# Patient Record
Sex: Female | Born: 1960 | Race: White | Hispanic: No | Smoking: Never smoker
Health system: Southern US, Community
[De-identification: ages and names within clinical notes are randomized; demographics above are authoritative.]

## PROBLEM LIST (undated history)

## (undated) DIAGNOSIS — K219 Gastro-esophageal reflux disease without esophagitis: Secondary | ICD-10-CM

## (undated) DIAGNOSIS — R232 Flushing: Secondary | ICD-10-CM

## (undated) DIAGNOSIS — R112 Nausea with vomiting, unspecified: Secondary | ICD-10-CM

## (undated) DIAGNOSIS — F32A Depression, unspecified: Secondary | ICD-10-CM

## (undated) DIAGNOSIS — F329 Major depressive disorder, single episode, unspecified: Secondary | ICD-10-CM

## (undated) DIAGNOSIS — Z9889 Other specified postprocedural states: Secondary | ICD-10-CM

## (undated) DIAGNOSIS — I471 Supraventricular tachycardia, unspecified: Secondary | ICD-10-CM

## (undated) DIAGNOSIS — C801 Malignant (primary) neoplasm, unspecified: Secondary | ICD-10-CM

## (undated) DIAGNOSIS — R7303 Prediabetes: Secondary | ICD-10-CM

## (undated) DIAGNOSIS — J309 Allergic rhinitis, unspecified: Secondary | ICD-10-CM

## (undated) DIAGNOSIS — C50919 Malignant neoplasm of unspecified site of unspecified female breast: Secondary | ICD-10-CM

## (undated) DIAGNOSIS — G43909 Migraine, unspecified, not intractable, without status migrainosus: Secondary | ICD-10-CM

## (undated) DIAGNOSIS — K589 Irritable bowel syndrome without diarrhea: Secondary | ICD-10-CM

## (undated) DIAGNOSIS — E7800 Pure hypercholesterolemia, unspecified: Secondary | ICD-10-CM

## (undated) DIAGNOSIS — G4700 Insomnia, unspecified: Secondary | ICD-10-CM

## (undated) DIAGNOSIS — F419 Anxiety disorder, unspecified: Secondary | ICD-10-CM

## (undated) DIAGNOSIS — M8580 Other specified disorders of bone density and structure, unspecified site: Secondary | ICD-10-CM

## (undated) DIAGNOSIS — K449 Diaphragmatic hernia without obstruction or gangrene: Secondary | ICD-10-CM

## (undated) DIAGNOSIS — J45909 Unspecified asthma, uncomplicated: Secondary | ICD-10-CM

## (undated) HISTORY — DX: Prediabetes: R73.03

## (undated) HISTORY — PX: UPPER GASTROINTESTINAL ENDOSCOPY: SHX188

## (undated) HISTORY — DX: Other specified disorders of bone density and structure, unspecified site: M85.80

## (undated) HISTORY — DX: Depression, unspecified: F32.A

## (undated) HISTORY — DX: Unspecified asthma, uncomplicated: J45.909

## (undated) HISTORY — DX: Malignant neoplasm of unspecified site of unspecified female breast: C50.919

## (undated) HISTORY — DX: Allergic rhinitis, unspecified: J30.9

## (undated) HISTORY — DX: Anxiety disorder, unspecified: F41.9

## (undated) HISTORY — DX: Migraine, unspecified, not intractable, without status migrainosus: G43.909

## (undated) HISTORY — DX: Insomnia, unspecified: G47.00

## (undated) HISTORY — PX: VAGINAL HYSTERECTOMY: SUR661

## (undated) HISTORY — PX: COLOSTOMY: SHX63

## (undated) HISTORY — DX: Gastro-esophageal reflux disease without esophagitis: K21.9

## (undated) HISTORY — DX: Diaphragmatic hernia without obstruction or gangrene: K44.9

## (undated) HISTORY — DX: Major depressive disorder, single episode, unspecified: F32.9

## (undated) HISTORY — DX: Flushing: R23.2

## (undated) HISTORY — DX: Irritable bowel syndrome, unspecified: K58.9

## (undated) NOTE — *Deleted (*Deleted)
[facility]  [phone] [phone]     ID: Kathy Barker DOB: August 18, 1960  MR#: [id number]  JYN#:[id number]  Patient Care Team: Lupita Raider, MD as PCP - General (Family Medicine) Donnelly Angelica, RN as Oncology Nurse Navigator Pershing Proud, RN as Oncology Nurse Navigator Griselda Miner, MD as Consulting Physician (General Surgery) Alley Neils, Valentino Hue, MD as Consulting Physician (Oncology) Lonie Peak, MD as Attending Physician (Radiation Oncology) Janalyn Harder, MD as Consulting Physician (Dermatology) Lowella Dell, MD OTHER MD:  CHIEF COMPLAINT: Left-sided breast cancer  CURRENT TREATMENT: awaiting definitive surgery   INTERVAL HISTORY: Kathy Barker returns today for follow up of her left-sided breast cancer.    REVIEW OF SYSTEMS: [place]    HISTORY OF CURRENT ILLNESS: From the original intake note:  Kathy Barker herself noted a palpable lower-inner left breast lump and immediately brought it to medical attention.. She underwent bilateral diagnostic mammography with tomography and left breast ultrasonography at [facility] on 05/30/2019 showing: breast density category B; 1.7 cm lobulated mass in left breast at 8 o'clock; no significant left axillary abnormalities.  Accordingly on 06/01/2019 she proceeded to biopsy of the left breast area in question. The pathology from this procedure ([id number]) showed: invasive ductal carcinoma, grade 3. Prognostic indicators significant for: estrogen receptor, 70% positive with weak staining intensity and progesterone receptor, 0% negative. Proliferation marker Ki67 at 80%. HER2 negative by immunohistochemistry (1+).  The patient's subsequent history is as detailed below.   PAST MEDICAL HISTORY: Past Medical History:  Diagnosis Date  . Allergic rhinitis   . Anxiety    "only during my divorce" (11/18/2012)  . Cancer (HCC)   . Depression    "only during my divorce" (11/18/2012)  . GERD  (gastroesophageal reflux disease)   . High cholesterol   . Hot flashes   . IBS (irritable bowel syndrome)   . Insomnia   . Migraines    "lately more often; before SVT I'd get them ~ q 6 months" (11/18/2012)  . PONV (postoperative nausea and vomiting)   . 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 CESAREAN SECTION  1992  . COMBINED HYSTERECTOMY VAGINAL / OOPHORECTOMY / A&P REPAIR  1999  . IR IMAGING GUIDED PORT INSERTION  06/30/2019  . SUPRAVENTRICULAR TACHYCARDIA ABLATION  11-18-2012   slow pathway modification of AVNRT by Dr Johney Frame  . SUPRAVENTRICULAR TACHYCARDIA ABLATION N/A 11/18/2012   Procedure: SUPRAVENTRICULAR TACHYCARDIA ABLATION;  Surgeon: Gardiner Rhyme, MD;  Location: [facility] CATH [facility];  Service: Cardiovascular;  Laterality: N/A;  . VAGINAL HYSTERECTOMY  ~ 2000    FAMILY HISTORY: Family History  Problem Relation Age of Onset  . Heart attack Father   . Alcohol abuse Father   . Heart attack Paternal Grandfather   . Heart Problems Other        both sides of family  . Breast cancer Mother    Her father died at age 46 from alcohol abuse. Her mother is living at age 70 (as of 05/2019) and has a history of DCIS at age 43 and invasive lobular breast cancer at age 43. Kathy Barker has one brother. She reports cancer of an unknown type in her maternal grandmother and prostate cancer in a maternal uncle.   GYNECOLOGIC HISTORY:  No LMP recorded. Patient has had a hysterectomy. Menarche: 21 years old Age at first live  birth: 82 years old GX P 2 LMP 2000 Contraceptive: never used HRT never used  Hysterectomy? yes BSO? 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  She will be on temporary disability during her chemotherapy.  She is divorced. She lives at home with son  Kathy Barker, age 42, who works in Aeronautical engineer in [place]. Son Kathy Barker, age 52, works in Engineer, petroleum here in [place]. She is not a Actor.     ADVANCED DIRECTIVES: Not in place. She intends to name both of her sons as her HCPOA.   HEALTH MAINTENANCE: Social History   Tobacco Use  . Smoking status: Never Smoker  . Smokeless tobacco: Never Used  Vaping Use  . Vaping Use: Never used  Substance Use Topics  . Alcohol use: Yes    Comment: 11/18/2012 "shot of brandy couple times/month"  . Drug use: No     Colonoscopy: yes, date unsure  PAP: date unsure  Bone density: never done   Allergies  Allergen Reactions  . Fentanyl Nausea And Vomiting    Per patient " extreme nausea and vomiting "   . Other     Problems with stitches  . Versed [Midazolam] Nausea And Vomiting    Per patient " extreme N/V"   . Latex Rash    Current Outpatient Medications  Medication Sig Dispense Refill  . acyclovir (ZOVIRAX) 400 MG tablet Take 2 tablets tid for 10 days then decrease to 1 tablet twice a day 120 tablet 3  . ciprofloxacin (CIPRO) 500 MG tablet Take 1 tablet (500 mg total) by mouth 2 (two) times daily. 10 tablet 0  . HYDROMET 5-1.5 MG/5ML syrup SMARTSIG:2 Milliliter(s) By Mouth 10 Times Daily PRN    . LIVALO 4 MG TABS Take 1 tablet by mouth daily.    [name] loratadine (CLARITIN) 10 MG tablet Take 1 tablet (10 mg total) by mouth daily. 60 tablet 3  . magic mouthwash w/lidocaine SOLN Take 5 mLs by mouth 4 (four) times daily as needed for mouth pain. 240 mL 1  . omeprazole (PRILOSEC) 40 MG capsule Take one capsule daily for 5 days starting on chemo day, then daily as needed 60 capsule 3  . sertraline (ZOLOFT) 100 MG tablet Take 100 mg by mouth daily.    . SYMBICORT 160-4.5 MCG/ACT inhaler Inhale 2 puffs into the lungs 2 (two) times daily.    . temazepam (RESTORIL) 15 MG capsule Take 15 mg by mouth at bedtime.     No current facility-administered medications for this visit.     OBJECTIVE: White woman in no acute distress There were no vitals filed for this visit.   There is no height or weight on file to calculate BMI.   Wt Readings from Last 3 Encounters:  10/26/19 140 lb 4.8 oz (63.6 kg)  10/19/19 139 lb 3.2 oz (63.1 kg)  10/04/19 136 lb (61.7 kg)      ECOG FS:1 - Symptomatic but completely ambulatory  Sclerae unicteric, EOMs intact Wearing a mask No cervical or supraclavicular adenopathy Lungs no rales or rhonchi Heart regular rate and rhythm Abd soft, nontender, positive bowel sounds MSK no focal spinal tenderness, no upper extremity lymphedema Neuro: nonfocal, well oriented, appropriate affect Breasts:    {Sclerae unicteric, EOMs intact Wearing a mask No cervical or supraclavicular adenopathy Lungs no rales or rhonchi Heart regular rate and rhythm Abd soft, nontender, positive bowel sounds MSK no focal spinal  tenderness, no upper extremity lymphedema Neuro: nonfocal, well oriented, appropriate affect Breasts: The right breast is unremarkable.  I do not palpate a mass in the left breast and there are no skin or nipple changes of concern.  Both axillae are benign.}   LAB RESULTS:  CMP     Component Value Date/Time   NA 141 10/26/2019 0822   K 3.9 10/26/2019 0822   CL 108 10/26/2019 0822   CO2 26 10/26/2019 0822   GLUCOSE 119 (H) 10/26/2019 0822   BUN 9 10/26/2019 0822   CREATININE 0.74 10/26/2019 0822   CALCIUM 9.3 10/26/2019 0822   PROT 6.5 10/26/2019 0822   ALBUMIN 3.8 10/26/2019 0822   AST 23 10/26/2019 0822   ALT 37 10/26/2019 0822   ALKPHOS 123 10/26/2019 0822   BILITOT 0.3 10/26/2019 0822   GFRNONAA >60 10/26/2019 0822   GFRAA >60 10/04/2019 1241    No results found for: TOTALPROTELP, ALBUMINELP, A1GS, A2GS, BETS, BETA2SER, GAMS, MSPIKE, SPEI  Lab Results  Component Value Date   WBC 4.9 10/26/2019   NEUTROABS 3.3 10/26/2019   HGB 11.8 (L) 10/26/2019   HCT 35.3 (L) 10/26/2019   MCV 98.3 10/26/2019   PLT 165 10/26/2019     No results found for: LABCA2  No components found for: XLKGMW102  No results for input(s): INR in the last 168 hours.  No results found for: LABCA2  No results found for: VOZ366  No results found for: YQI347  No results found for: QQV956  No results found for: CA2729  No components found for: HGQUANT  No results found for: CEA1 / No results found for: CEA1   No results found for: AFPTUMOR  No results found for: CHROMOGRNA  No results found for: KPAFRELGTCHN, LAMBDASER, KAPLAMBRATIO (kappa/lambda light chains)  No results found for: HGBA, HGBA2QUANT, HGBFQUANT, HGBSQUAN (Hemoglobinopathy evaluation)   No results found for: LDH  No results found for: IRON, TIBC, IRONPCTSAT (Iron and TIBC)  No results found for: FERRITIN  Urinalysis No results found for: COLORURINE, APPEARANCEUR, LABSPEC, PHURINE, GLUCOSEU, HGBUR, BILIRUBINUR, KETONESUR, PROTEINUR, UROBILINOGEN, NITRITE, LEUKOCYTESUR   STUDIES: MR BREAST BILATERAL W WO CONTRAST INC CAD  Result Date: 10/24/2019 CLINICAL DATA:  35 year old female with LEFT breast cancer diagnosed in June 2021, evaluate response to neoadjuvant treatment. Patient also had MR guided biopsies demonstrating complex sclerosing lesion within the UPPER central LEFT breast and benign changes within the LOWER central LEFT breast. LABS:  None performed today EXAM: BILATERAL BREAST MRI WITH AND WITHOUT CONTRAST TECHNIQUE: Multiplanar, multisequence MR images of both breasts were obtained prior to and following the intravenous administration of 10 ml of Gadavist Three-dimensional MR images were rendered by post-processing of the original MR data on an independent workstation. The three-dimensional MR images were interpreted, and findings are reported in the following complete MRI report for this study. Three dimensional images were evaluated at the independent interpreting workstation using the DynaCAD thin client. COMPARISON:  06/29/2019 FINDINGS:  Breast composition: b. Scattered fibroglandular tissue. Background parenchymal enhancement: Mild Right breast: No mass or abnormal enhancement. Scattered foci are again noted. Left breast: The known malignancy within the LOWER INNER LEFT breast is no longer visualized. Biopsy clip artifact in this area is again identified. Scattered foci and clumped non masslike enhancement are not significantly changed. Biopsy clip artifacts within the central LEFT breast noted. Lymph nodes: No abnormal appearing lymph nodes. Ancillary findings: Port-A-Cath within the UPPER MEDIAL RIGHT chest noted. IMPRESSION: 1. Treatment response with known LOWER INNER LEFT breast  malignancy no longer visualized. 2. No suspicious findings within either breast. No abnormal appearing lymph nodes. RECOMMENDATION: Treatment plan BI-RADS CATEGORY  6: Known biopsy-proven malignancy. Electronically Signed   By: Harmon Pier M.D.   On: 10/24/2019 15:38     ELIGIBLE FOR AVAILABLE RESEARCH PROTOCOL: no  ASSESSMENT: 13 y.o. [place] woman status post left breast lower inner quadrant biopsy 06/01/2019 for a clinical T1c N0, stage IB invasive ductal carcinoma, grade 3, weakly estrogen receptor positive but functionally triple negative, with an MIB-1 of 80%.  (1) Oncotype score of 51 predicts a risk of recurrence outside the breast in the next 9 years of greater than 39% if the patient's only systemic therapy is antiestrogens for 5 years.  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  Likely this is still not affecting her daily activities.  We reviewed the MRI results which are very encouraging.  I am hopeful she will have a complete pathologic response at the time of definitive surgery.  I have alerted her surgeon to let him know she is now ready.  Just in case there is some residual disease, in which case we would use pembrolizumab, we are going to keep the port for now  Karlina will see me in about a month by which time we should have the results of her definitive surgery  She knows to call for any other issue that may develop before then  Total encounter time 25 minutes.Raymond Gurney C. Alsha Meland, MD 11/02/19 10:48 AM Medical Oncology and Hematology [facility] [address] [place], [place] [address] Tel. [phone]    Fax. [phone]   I, Mickie Bail, am acting as scribe for Dr. Valentino Hue. Christian Treadway.  I, Ruthann Cancer MD, have reviewed the above documentation for accuracy and completeness, and I agree with the above.   *Total Encounter Time as defined by the Centers for Medicare and Medicaid Services includes, in addition to the face-to-face time of a patient visit (documented in the note above) non-face-to-face time: obtaining and reviewing outside history, ordering and reviewing medications, tests or procedures, care coordination (communications with other health care professionals or caregivers) and documentation in the medical record.

---

## 1997-01-13 HISTORY — PX: COMBINED HYSTERECTOMY VAGINAL / OOPHORECTOMY / A&P REPAIR: SUR294

## 1997-05-05 ENCOUNTER — Ambulatory Visit (HOSPITAL_BASED_OUTPATIENT_CLINIC_OR_DEPARTMENT_OTHER): Admission: RE | Admit: 1997-05-05 | Discharge: 1997-05-05 | Payer: Self-pay | Admitting: Urology

## 1999-04-29 ENCOUNTER — Other Ambulatory Visit: Admission: RE | Admit: 1999-04-29 | Discharge: 1999-04-29 | Payer: Self-pay | Admitting: Obstetrics and Gynecology

## 2002-10-11 ENCOUNTER — Encounter: Payer: Self-pay | Admitting: Family Medicine

## 2002-10-11 ENCOUNTER — Encounter: Admission: RE | Admit: 2002-10-11 | Discharge: 2002-10-11 | Payer: Self-pay | Admitting: Family Medicine

## 2012-10-23 ENCOUNTER — Telehealth: Payer: Self-pay | Admitting: Interventional Cardiology

## 2012-10-23 NOTE — Telephone Encounter (Signed)
Please have patient come in to see Kathy Barker next week to make sure she is tolerating the beta blocker ok

## 2012-10-23 NOTE — Telephone Encounter (Signed)
I spoke to Ms. Hyman Bower.  She has severe palpitations with the SVT but no syncope.  Will call in Metoprolol 25 mg BID to [facility].

## 2012-10-25 ENCOUNTER — Encounter: Payer: Self-pay | Admitting: Interventional Cardiology

## 2012-10-25 ENCOUNTER — Encounter: Payer: Self-pay | Admitting: *Deleted

## 2012-10-25 DIAGNOSIS — G4700 Insomnia, unspecified: Secondary | ICD-10-CM | POA: Insufficient documentation

## 2012-10-25 DIAGNOSIS — K219 Gastro-esophageal reflux disease without esophagitis: Secondary | ICD-10-CM | POA: Insufficient documentation

## 2012-10-25 DIAGNOSIS — K589 Irritable bowel syndrome without diarrhea: Secondary | ICD-10-CM | POA: Insufficient documentation

## 2012-10-25 DIAGNOSIS — R232 Flushing: Secondary | ICD-10-CM | POA: Insufficient documentation

## 2012-10-25 DIAGNOSIS — F329 Major depressive disorder, single episode, unspecified: Secondary | ICD-10-CM | POA: Insufficient documentation

## 2012-10-25 DIAGNOSIS — J309 Allergic rhinitis, unspecified: Secondary | ICD-10-CM | POA: Insufficient documentation

## 2012-10-25 DIAGNOSIS — F419 Anxiety disorder, unspecified: Secondary | ICD-10-CM | POA: Insufficient documentation

## 2012-10-25 DIAGNOSIS — F32A Depression, unspecified: Secondary | ICD-10-CM | POA: Insufficient documentation

## 2012-10-26 ENCOUNTER — Ambulatory Visit (INDEPENDENT_AMBULATORY_CARE_PROVIDER_SITE_OTHER): Payer: BC Managed Care – PPO | Admitting: Interventional Cardiology

## 2012-10-26 ENCOUNTER — Encounter: Payer: Self-pay | Admitting: Interventional Cardiology

## 2012-10-26 VITALS — BP 122/90 | HR 84 | Ht 61.0 in | Wt 128.0 lb

## 2012-10-26 DIAGNOSIS — I471 Supraventricular tachycardia: Secondary | ICD-10-CM

## 2012-10-26 DIAGNOSIS — I498 Other specified cardiac arrhythmias: Secondary | ICD-10-CM | POA: Insufficient documentation

## 2012-10-26 NOTE — Patient Instructions (Addendum)
You have been referred to EP for SVT.  Your physician recommends that you schedule a follow-up appointment as needed.

## 2012-10-26 NOTE — Progress Notes (Signed)
Patient ID: Kathy Barker, female   DOB: Jan 11, 1961, 52 y.o.   MRN: [id number]    [address] [place], [place]  [address] Phone: [phone] Fax:  [phone]  Date:  10/26/2012   ID:  Kathy Barker, DOB 02-19-1960, MRN [id number]  PCP:  No primary provider on file.      History of Present Illness: Kathy Barker is a 52 y.o. female who has had palpitations for many years.  Episodes are getting more frequent.  She remembers similar episodes even 7 years ago.  She had lightheadedness, nausea and they can happen while driving.  No syncope.  No CP or SHOB at other times.  Monitor showed SVT with HR around 200 bpm.  She was started on metoprolol a few days ago.  She generally feels tired. She does not feel herself. She is unsure if this is residual effects from the fast heart rhythm or if this is from the metoprolol. Even prior to the metoprolol, he had fatigue. No chest pain. She tries to remain active and is without any exertional symptoms.   Wt Readings from Last 3 Encounters:  10/26/12 128 lb (58.06 kg)     Past Medical History  Diagnosis Date  . Anxiety   . Allergic rhinitis   . Insomnia   . Hot flashes   . GERD (gastroesophageal reflux disease)   . Depression   . IBS (irritable bowel syndrome)     Current Outpatient Prescriptions  Medication Sig Dispense Refill  . acyclovir (ZOVIRAX) 400 MG tablet as needed.      . ALPRAZolam (XANAX XR) 1 MG 24 hr tablet as needed.      . metoprolol tartrate (LOPRESSOR) 25 MG tablet Take 1 tablet by mouth 2 (two) times daily.      . simvastatin (ZOCOR) 20 MG tablet Take 1 tablet by mouth daily.       No current facility-administered medications for this visit.    Allergies:    Allergies  Allergen Reactions  . Latex   . Other     Problems with stitches    Social History:  The patient  reports that she has never smoked. She does not have any smokeless tobacco history on file. She reports that she drinks alcohol. She  reports that she does not use illicit drugs.   Family History:  The patient's family history includes Alcohol abuse in her father; Heart Problems in an other family member; Heart attack in her father and paternal grandfather.   ROS:  Please see the history of present illness.  No nausea, vomiting.  No fevers, chills.  No focal weakness.  No dysuria. Fatigue as noted above  All other systems reviewed and negative.   PHYSICAL EXAM: VS:  BP 122/90  Pulse 84  Ht 5\' 1"  (1.549 m)  Wt 128 lb (58.06 kg)  BMI 24.2 kg/m2  SpO2 98% Well nourished, well developed, in no acute distress HEENT: normal Neck: no JVD, no carotid bruits Cardiac:  normal S1, S2; RRR;  Lungs:  clear to auscultation bilaterally, no wheezing, rhonchi or rales Abd: soft, nontender, no hepatomegaly Ext: no edema Skin: warm and dry Neuro:   no focal abnormalities noted  EKG:  Telemetry strip reviewed: Narrow complex rhythm, tachycardic  ASSESSMENT AND PLAN:  1. 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 If she continues to have fatigue or just does not feel well, would consider referring for ablation. Will schedule appointment with electrophysiology. If her symptoms  of fatigue resolve, she will cancel the appointment. If she remains fatigued on metoprolol, she can see electrophysiology and they can either decide on changing medications or EP study with ablation. 2.  3. I reviewed her echocardiogram from September of 2014. She has normal left ventricular function with only mild mitral regurgitation. No significant structural heart disease that would explain her SVT.  Signed, Fredric Mare, MD, [facility] 10/26/2012 11:41 AM

## 2012-10-28 ENCOUNTER — Ambulatory Visit (INDEPENDENT_AMBULATORY_CARE_PROVIDER_SITE_OTHER): Payer: BC Managed Care – PPO | Admitting: Internal Medicine

## 2012-10-28 ENCOUNTER — Telehealth: Payer: Self-pay | Admitting: Internal Medicine

## 2012-10-28 ENCOUNTER — Encounter: Payer: Self-pay | Admitting: Internal Medicine

## 2012-10-28 VITALS — BP 112/77 | HR 84 | Ht 61.0 in | Wt 120.1 lb

## 2012-10-28 DIAGNOSIS — I471 Supraventricular tachycardia: Secondary | ICD-10-CM | POA: Insufficient documentation

## 2012-10-28 DIAGNOSIS — I498 Other specified cardiac arrhythmias: Secondary | ICD-10-CM

## 2012-10-28 NOTE — Telephone Encounter (Signed)
Pt is calling back with date for ablation 11/18/12. You can leave a detailed message if she don't answer.

## 2012-10-28 NOTE — Patient Instructions (Signed)
Your physician has recommended that you have an ablation. Catheter ablation is a medical procedure used to treat some cardiac arrhythmias (irregular heartbeats). During catheter ablation, a long, thin, flexible tube is put into a blood vessel in your groin (upper thigh), or neck. This tube is called an ablation catheter. It is then guided to your heart through the blood vessel. Radio frequency waves destroy small areas of heart tissue where abnormal heartbeats may cause an arrhythmia to start. Please see the instruction sheet given to you today.  Call me [phone] Kathy Barker  Dates:10/28, 10/29,11/04,11/05,11/06,11/11,11/13,11/14,11/18,11/21,11/25,

## 2012-10-28 NOTE — Progress Notes (Signed)
Primary Care Physician: SHAW,KIMBERLEE, MD Referring Physician:  Dr Varanasi   Kathy Barker is a 52 y.o. female with a h/o tachycardia and palpitations who presents for Ep consultation.  She reports that she has had abrupt onset tachypalpitations for 6-7 years.  Episodes would typically wake her from sleep with breathlessness.  During episodes, she feels "heart racing", dizziness, and SOB.  She reports a sensation of "butterflies in her chest".  She has migraines afterwards.  She has not had syncope.  Episodes typically last 5 minutes.  She not tried vagal maneuvers and finds that episodes typically spontaneously resolve in a gradual fashion.  She reports feeling "washed out" afterwards.  She is unaware of any triggers or precipitants for episodes.  Episodes occur without pattern 1-2 times per week. She frequently has atypical chest "aching" and "heaviness".  She has this in the office today and reports that it occurs frequently with associated SOB. Today, she denies symptoms of exerertional chest pain,  orthopnea, PND, or lower extremity edema. The patient is tolerating medications without difficulties and is otherwise without complaint today.   Past Medical History  Diagnosis Date  . Anxiety   . Allergic rhinitis   . Insomnia   . Hot flashes   . GERD (gastroesophageal reflux disease)   . Depression   . IBS (irritable bowel syndrome)   . Migraines   . Tachycardia    Past Surgical History  Procedure Laterality Date  . Vaginal hysterectomy    . Cesarean section      Current Outpatient Prescriptions  Medication Sig Dispense Refill  . acyclovir (ZOVIRAX) 400 MG tablet as needed.      . ALPRAZolam (XANAX XR) 1 MG 24 hr tablet as needed.      . metoprolol tartrate (LOPRESSOR) 25 MG tablet Take 1 tablet by mouth 2 (two) times daily.      . simvastatin (ZOCOR) 20 MG tablet Take 1 tablet by mouth daily.       No current facility-administered medications for this visit.    Allergies    Allergen Reactions  . Latex   . Other     Problems with stitches    History   Social History  . Marital Status: Legally Separated    Spouse Name: N/A    Number of Children: N/A  . Years of Education: N/A   Occupational History  . Not on file.   Social History Main Topics  . Smoking status: Never Smoker   . Smokeless tobacco: Not on file  . Alcohol Use: Yes     Comment: rare  . Drug Use: No  . Sexual Activity: Not on file   Other Topics Concern  . Not on file   Social History Narrative   Lives in [place] with son age 18.  Works as a staffing coordinator.    Family History  Problem Relation Age of Onset  . Heart attack Father   . Alcohol abuse Father   . Heart attack Paternal Grandfather   . Heart Problems      both sides of family    ROS- All systems are reviewed and negative except as per the HPI above  Physical Exam: Filed Vitals:   10/28/12 1026  BP: 112/77  Pulse: 84  Height: 5' 1" (1.549 m)  Weight: 120 lb 1.9 oz (54.486 kg)    GEN- The patient is well appearing, alert and oriented x 3 today.   Head- normocephalic, atraumatic Eyes-  Sclera clear, conjunctiva pink   Ears- hearing intact Oropharynx- clear Neck- supple, no JVP Lymph- no cervical lymphadenopathy Lungs- Clear to ausculation bilaterally, normal work of breathing Heart- Regular rate and rhythm, no murmurs, rubs or gallops, PMI not laterally displaced GI- soft, NT, ND, + BS Extremities- no clubbing, cyanosis, or edema MS- no significant deformity or atrophy Skin- no rash or lesion Psych- anxious appearing, euthymic mood, full affect Neuro- strength and sensation are intact  EKG  Today reveals sinus rhythm 73 bpm, PR 142 msec, otherwise normal ekg Echo 10/07/12- normal Event monitor reveals narrow complex regular SVT at 200 bpm on 10/22/12 which correlates to her symptoms  Assessment and Plan:  1. SVT The patient has symptomatic recurrent SVT.  She has not received improvement  with metoprolol.  Therapeutic strategies for supraventricular tachycardia including medicine and ablation were discussed in detail with the patient today. Risk, benefits, and alternatives to EP study and radiofrequency ablation were also discussed in detail today. These risks include but are not limited to stroke, bleeding, vascular damage, tamponade, perforation, damage to the heart and other structures, AV block requiring pacemaker, worsening renal function, and death. The patient understands these risk and wishes to proceed.  We will therefore proceed with catheter ablation at the next available time. She was instructed to hold metoprolol for 48 hours prior to the procedure by me today. 

## 2012-10-29 NOTE — Telephone Encounter (Signed)
Patient aware of time and date  Labs on 10/29 at 4:30pm

## 2012-11-02 ENCOUNTER — Encounter: Payer: Self-pay | Admitting: Interventional Cardiology

## 2012-11-09 ENCOUNTER — Telehealth: Payer: Self-pay | Admitting: Internal Medicine

## 2012-11-09 ENCOUNTER — Emergency Department (HOSPITAL_COMMUNITY): Payer: BC Managed Care – PPO

## 2012-11-09 ENCOUNTER — Encounter (HOSPITAL_COMMUNITY): Payer: Self-pay | Admitting: Emergency Medicine

## 2012-11-09 ENCOUNTER — Emergency Department (HOSPITAL_COMMUNITY)
Admission: EM | Admit: 2012-11-09 | Discharge: 2012-11-09 | Disposition: A | Discharge status: Home / Self Care | Payer: BC Managed Care – PPO | Attending: Emergency Medicine | Admitting: Emergency Medicine

## 2012-11-09 DIAGNOSIS — F411 Generalized anxiety disorder: Secondary | ICD-10-CM | POA: Insufficient documentation

## 2012-11-09 DIAGNOSIS — I498 Other specified cardiac arrhythmias: Secondary | ICD-10-CM | POA: Insufficient documentation

## 2012-11-09 DIAGNOSIS — I471 Supraventricular tachycardia: Secondary | ICD-10-CM

## 2012-11-09 DIAGNOSIS — F329 Major depressive disorder, single episode, unspecified: Secondary | ICD-10-CM | POA: Insufficient documentation

## 2012-11-09 DIAGNOSIS — Z8719 Personal history of other diseases of the digestive system: Secondary | ICD-10-CM | POA: Insufficient documentation

## 2012-11-09 DIAGNOSIS — Z8709 Personal history of other diseases of the respiratory system: Secondary | ICD-10-CM | POA: Insufficient documentation

## 2012-11-09 DIAGNOSIS — Z91040 Latex allergy status: Secondary | ICD-10-CM | POA: Insufficient documentation

## 2012-11-09 DIAGNOSIS — Z79899 Other long term (current) drug therapy: Secondary | ICD-10-CM | POA: Insufficient documentation

## 2012-11-09 DIAGNOSIS — F3289 Other specified depressive episodes: Secondary | ICD-10-CM | POA: Insufficient documentation

## 2012-11-09 LAB — POCT I-STAT, CHEM 8
BUN: 13 mg/dL (ref 6–23)
Calcium, Ion: 1.23 mmol/L (ref 1.12–1.23)
Chloride: 108 mEq/L (ref 96–112)
Creatinine, Ser: 0.8 mg/dL (ref 0.50–1.10)
Glucose, Bld: 104 mg/dL — ABNORMAL HIGH (ref 70–99)
HCT: 42 % (ref 36.0–46.0)
Hemoglobin: 14.3 g/dL (ref 12.0–15.0)
Potassium: 3.9 mEq/L (ref 3.5–5.1)
Sodium: 145 mEq/L (ref 135–145)
TCO2: 24 mmol/L (ref 0–100)

## 2012-11-09 LAB — CBC
HCT: 41.3 % (ref 36.0–46.0)
Hemoglobin: 14.4 g/dL (ref 12.0–15.0)
MCH: 32.4 pg (ref 26.0–34.0)
MCHC: 34.9 g/dL (ref 30.0–36.0)
MCV: 93 fL (ref 78.0–100.0)
Platelets: 196 10*3/uL (ref 150–400)
RBC: 4.44 MIL/uL (ref 3.87–5.11)
RDW: 12.1 % (ref 11.5–15.5)
WBC: 8.6 10*3/uL (ref 4.0–10.5)

## 2012-11-09 LAB — POCT I-STAT TROPONIN I: Troponin i, poc: 0 ng/mL (ref 0.00–0.08)

## 2012-11-09 MED ORDER — SODIUM CHLORIDE 0.9 % IV BOLUS (SEPSIS)
500.0000 mL | Freq: Once | INTRAVENOUS | Status: AC
  Administered 2012-11-09: 500 mL via INTRAVENOUS

## 2012-11-09 MED ORDER — METOPROLOL SUCCINATE ER 25 MG PO TB24: 50.0000 mg | ORAL_TABLET | Freq: Every day | ORAL | Status: DC

## 2012-11-09 NOTE — ED Notes (Signed)
MD at bedside. 

## 2012-11-09 NOTE — ED Notes (Signed)
EMS reported patient nausea and gave zofran 4mg  IVP.

## 2012-11-09 NOTE — ED Provider Notes (Signed)
CSN: [id number]     Arrival date & time 11/09/12  0127 History   First MD Initiated Contact with Patient 11/09/12 0156     Chief Complaint  Patient presents with  . Palpitations   (Consider location/radiation/quality/duration/timing/severity/associated sxs/prior Treatment) Patient is a 52 y.o. female presenting with palpitations. The history is provided by the patient.  Palpitations Palpitations quality:  Fast Onset quality:  Sudden Timing:  Constant Progression:  Resolved Chronicity:  Recurrent Context: not anxiety, not caffeine and not illicit drugs   Relieved by: adenosine 6 mg scheduled for ablation 11/6 happen 2 times a weel. Worsened by:  Nothing tried Ineffective treatments:  None tried Associated symptoms: no chest pain, no shortness of breath, no syncope and no vomiting   Risk factors: no diabetes mellitus and no hx of PE     Past Medical History  Diagnosis Date  . Anxiety   . Allergic rhinitis   . Insomnia   . Hot flashes   . GERD (gastroesophageal reflux disease)   . Depression   . IBS (irritable bowel syndrome)   . Migraines   . Tachycardia    Past Surgical History  Procedure Laterality Date  . Vaginal hysterectomy    . Cesarean section    . Bladder surgery     Family History  Problem Relation Age of Onset  . Heart attack Father   . Alcohol abuse Father   . Heart attack Paternal Grandfather   . Heart Problems      both sides of family   History  Substance Use Topics  . Smoking status: Never Smoker   . Smokeless tobacco: Not on file  . Alcohol Use: Yes     Comment: rare   OB History   Grav Para Term Preterm Abortions TAB SAB Ect Mult Living                 Review of Systems  Respiratory: Negative for shortness of breath.   Cardiovascular: Positive for palpitations. Negative for chest pain, leg swelling and syncope.  Gastrointestinal: Negative for vomiting.  All other systems reviewed and are negative.    Allergies  Other and  Latex  Home Medications   Current Outpatient Rx  Name  Route  Sig  Dispense  Refill  . ALPRAZolam (XANAX XR) 1 MG 24 hr tablet   Oral   Take 1 mg by mouth daily as needed (for anxiety).          . metoprolol tartrate (LOPRESSOR) 25 MG tablet   Oral   Take 1 tablet by mouth 2 (two) times daily.         . simvastatin (ZOCOR) 20 MG tablet   Oral   Take 1 tablet by mouth daily.          BP 129/75  Pulse 81  Temp(Src) 98.3 F (36.8 C) (Oral)  Resp 15  SpO2 98% Physical Exam  Constitutional: She is oriented to person, place, and time. She appears well-developed and well-nourished. No distress.  HENT:  Head: Normocephalic and atraumatic.  Mouth/Throat: Oropharynx is clear and moist.  Eyes: Conjunctivae are normal. Pupils are equal, round, and reactive to light.  Neck: Normal range of motion. Neck supple.  Cardiovascular: Normal rate, regular rhythm and intact distal pulses.   Pulmonary/Chest: Effort normal and breath sounds normal. She has no wheezes. She has no rales.  Abdominal: Soft. Bowel sounds are normal. There is no tenderness. There is no rebound and no guarding.  Musculoskeletal: Normal range  of motion. She exhibits no edema.  Neurological: She is alert and oriented to person, place, and time.  Skin: Skin is warm and dry.  Psychiatric: She has a normal mood and affect.    ED Course  Procedures (including critical care time) Labs Review Labs Reviewed  POCT I-STAT, CHEM 8 - Abnormal; Notable for the following:    Glucose, Bld 104 (*)    All other components within normal limits  CBC  POCT I-STAT TROPONIN I   Imaging Review Dg Chest 2 View  11/09/2012   CLINICAL DATA:  Palpitations. Plans for heart ablation.  EXAM: CHEST  2 VIEW  COMPARISON:  None.  FINDINGS: The heart size and mediastinal contours are within normal limits. Both lungs are clear of edema or consolidation. Biapical pleural thickening/scarring. The visualized skeletal structures are  unremarkable.  IMPRESSION: No active cardiopulmonary disease.   Electronically Signed   By: Tiburcio Pea M.D.   On: 11/09/2012 03:54    EKG Interpretation     Ventricular Rate:  96 PR Interval:  145 QRS Duration: 74 QT Interval:  337 QTC Calculation: 426 R Axis:   81 Text Interpretation:  Sinus rhythm           Wells 0 MDM  No diagnosis found.  Date: 11/09/2012  Rate: 96  Rhythm: normal sinus rhythm  QRS Axis: normal  Intervals: normal  ST/T Wave abnormalities: normal  Conduction Disutrbances: none  Narrative Interpretation: unremarkable    Case d/w Dr. Jearld Pies change metoprolol to XL 50 mg and call office for follow up  Gal Smolinski Smitty Cords, MD 11/09/12 [phone]

## 2012-11-09 NOTE — Telephone Encounter (Signed)
Stay on medications the way prescribed and come tomorrow for instructions

## 2012-11-09 NOTE — Telephone Encounter (Signed)
New Problem:  Pt states she just got out of the hospital. Pt states the hospital changed her meds and the pt wants to know if her meds need to stay as the hospital doctor ordered or as Dr. Johney Frame ordered.... Also, the pt states she is scheduled for surgery w/ Dr. Johney Frame adn she wants to know if that's still happening since she was seen in the hospital. Please advise

## 2012-11-09 NOTE — ED Notes (Signed)
Onset today developed palpitations took own metoprolol no relief EMS called heart rate 200. Given 6mg  adenosine heart rate 90-108.

## 2012-11-09 NOTE — ED Notes (Signed)
Pt states she does not want anything nausea medicine at this time, pt informed to let RN know if she changes her mind.

## 2012-11-10 ENCOUNTER — Encounter: Payer: Self-pay | Admitting: *Deleted

## 2012-11-10 ENCOUNTER — Other Ambulatory Visit: Payer: Self-pay | Admitting: *Deleted

## 2012-11-11 DIAGNOSIS — R002 Palpitations: Secondary | ICD-10-CM

## 2012-11-12 ENCOUNTER — Telehealth: Payer: Self-pay | Admitting: Cardiology

## 2012-11-12 NOTE — Telephone Encounter (Signed)
Interpreted by Dr. 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

## 2012-11-12 NOTE — Telephone Encounter (Signed)
Pt notified of results. Pt had to go to ER on Monday and pt will have ablation on 11/18/12.

## 2012-11-18 ENCOUNTER — Encounter (HOSPITAL_COMMUNITY): Payer: BC Managed Care – PPO | Admitting: Certified Registered Nurse Anesthetist

## 2012-11-18 ENCOUNTER — Encounter (HOSPITAL_COMMUNITY): Payer: Self-pay | Admitting: Certified Registered Nurse Anesthetist

## 2012-11-18 ENCOUNTER — Encounter (HOSPITAL_COMMUNITY)
Admission: RE | Disposition: A | Discharge status: Home / Self Care | Payer: Self-pay | Source: Ambulatory Visit | Attending: Internal Medicine

## 2012-11-18 ENCOUNTER — Ambulatory Visit (HOSPITAL_COMMUNITY)
Admission: RE | Admit: 2012-11-18 | Discharge: 2012-11-18 | Disposition: A | Discharge status: Home / Self Care | Payer: BC Managed Care – PPO | Source: Ambulatory Visit | Attending: Internal Medicine | Admitting: Internal Medicine

## 2012-11-18 ENCOUNTER — Ambulatory Visit (HOSPITAL_COMMUNITY): Payer: BC Managed Care – PPO | Admitting: Certified Registered Nurse Anesthetist

## 2012-11-18 ENCOUNTER — Ambulatory Visit (HOSPITAL_COMMUNITY): Admit: 2012-11-18 | Payer: Self-pay | Admitting: Internal Medicine

## 2012-11-18 DIAGNOSIS — Z8679 Personal history of other diseases of the circulatory system: Secondary | ICD-10-CM

## 2012-11-18 DIAGNOSIS — F3289 Other specified depressive episodes: Secondary | ICD-10-CM | POA: Insufficient documentation

## 2012-11-18 DIAGNOSIS — K219 Gastro-esophageal reflux disease without esophagitis: Secondary | ICD-10-CM | POA: Insufficient documentation

## 2012-11-18 DIAGNOSIS — F329 Major depressive disorder, single episode, unspecified: Secondary | ICD-10-CM | POA: Insufficient documentation

## 2012-11-18 DIAGNOSIS — F411 Generalized anxiety disorder: Secondary | ICD-10-CM | POA: Insufficient documentation

## 2012-11-18 DIAGNOSIS — I471 Supraventricular tachycardia: Secondary | ICD-10-CM

## 2012-11-18 DIAGNOSIS — I498 Other specified cardiac arrhythmias: Secondary | ICD-10-CM | POA: Insufficient documentation

## 2012-11-18 HISTORY — PX: SUPRAVENTRICULAR TACHYCARDIA ABLATION: SHX5492

## 2012-11-18 HISTORY — PX: SUPRAVENTRICULAR TACHYCARDIA ABLATION: SHX6106

## 2012-11-18 HISTORY — DX: Other specified postprocedural states: Z98.890

## 2012-11-18 HISTORY — DX: Supraventricular tachycardia: I47.1

## 2012-11-18 HISTORY — DX: Nausea with vomiting, unspecified: R11.2

## 2012-11-18 HISTORY — DX: Pure hypercholesterolemia, unspecified: E78.00

## 2012-11-18 HISTORY — DX: Supraventricular tachycardia, unspecified: I47.10

## 2012-11-18 LAB — BASIC METABOLIC PANEL
BUN: 13 mg/dL (ref 6–23)
CO2: 28 mEq/L (ref 19–32)
Calcium: 9.8 mg/dL (ref 8.4–10.5)
Chloride: 105 mEq/L (ref 96–112)
Creatinine, Ser: 0.76 mg/dL (ref 0.50–1.10)
GFR calc Af Amer: >90 mL/min (ref >90)
GFR calc non Af Amer: >90 mL/min (ref >90)
Glucose, Bld: 91 mg/dL (ref 70–99)
Potassium: 3.7 mEq/L (ref 3.5–5.1)
Sodium: 144 mEq/L (ref 135–145)

## 2012-11-18 SURGERY — SUPRAVENTRICULAR TACHYCARDIA ABLATION
Anesthesia: Monitor Anesthesia Care

## 2012-11-18 SURGERY — SUPRAVENTRICULAR TACHYCARDIA ABLATION
Anesthesia: LOCAL

## 2012-11-18 MED ORDER — SODIUM CHLORIDE 0.9 % IJ SOLN: 3.0000 mL | Freq: Two times a day (BID) | INTRAMUSCULAR | Status: DC

## 2012-11-18 MED ORDER — MIDAZOLAM HCL 5 MG/5ML IJ SOLN
INTRAMUSCULAR | Status: DC | PRN
  Administered 2012-11-18: 2 mg via INTRAVENOUS

## 2012-11-18 MED ORDER — SODIUM CHLORIDE 0.9 % IV SOLN
1000.0000 ug | INTRAVENOUS | Status: DC | PRN
  Administered 2012-11-18: 2 ug/min via INTRAVENOUS

## 2012-11-18 MED ORDER — SODIUM CHLORIDE 0.9 % IJ SOLN: 3.0000 mL | INTRAMUSCULAR | Status: DC | PRN

## 2012-11-18 MED ORDER — METOPROLOL SUCCINATE ER 25 MG PO TB24: 25.0000 mg | ORAL_TABLET | Freq: Every day | ORAL | Status: DC

## 2012-11-18 MED ORDER — ISOPROTERENOL HCL 0.2 MG/ML IJ SOLN
INTRAMUSCULAR | Status: AC
  Filled 2012-11-18: qty 5

## 2012-11-18 MED ORDER — SODIUM CHLORIDE 0.9 % IV SOLN: 250.0000 mL | INTRAVENOUS | Status: DC | PRN

## 2012-11-18 MED ORDER — HYDROCODONE-ACETAMINOPHEN 5-325 MG PO TABS: 1.0000 | ORAL_TABLET | ORAL | Status: DC | PRN

## 2012-11-18 MED ORDER — PROPOFOL INFUSION 10 MG/ML OPTIME
INTRAVENOUS | Status: DC | PRN
  Administered 2012-11-18: 50 ug/kg/min via INTRAVENOUS

## 2012-11-18 MED ORDER — ONDANSETRON HCL 4 MG/2ML IJ SOLN: 4.0000 mg | Freq: Four times a day (QID) | INTRAMUSCULAR | Status: DC | PRN

## 2012-11-18 MED ORDER — LACTATED RINGERS IV SOLN
INTRAVENOUS | Status: DC | PRN
  Administered 2012-11-18: 08:00:00 via INTRAVENOUS

## 2012-11-18 MED ORDER — ACETAMINOPHEN 325 MG PO TABS: 650.0000 mg | ORAL_TABLET | ORAL | Status: DC | PRN

## 2012-11-18 MED ORDER — ONDANSETRON HCL 4 MG/2ML IJ SOLN
INTRAMUSCULAR | Status: DC | PRN
  Administered 2012-11-18: 4 mg via INTRAVENOUS

## 2012-11-18 MED ORDER — FENTANYL CITRATE 0.05 MG/ML IJ SOLN
INTRAMUSCULAR | Status: DC | PRN
  Administered 2012-11-18: 50 ug via INTRAVENOUS
  Administered 2012-11-18: 25 ug via INTRAVENOUS
  Administered 2012-11-18: 50 ug via INTRAVENOUS

## 2012-11-18 MED ORDER — BUPIVACAINE HCL (PF) 0.25 % IJ SOLN
INTRAMUSCULAR | Status: AC
  Filled 2012-11-18: qty 30

## 2012-11-18 NOTE — H&P (View-Only) (Signed)
Primary Care Physician: Kathy Raider, MD Referring Physician:  Dr Kathy Barker is a 52 y.o. female with a h/o tachycardia and palpitations who presents for Ep consultation.  She reports that she has had abrupt onset tachypalpitations for 6-7 years.  Episodes would typically wake her from sleep with breathlessness.  During episodes, she feels "heart racing", dizziness, and SOB.  She reports a sensation of "butterflies in her chest".  She has migraines afterwards.  She has not had syncope.  Episodes typically last 5 minutes.  She not tried vagal maneuvers and finds that episodes typically spontaneously resolve in a gradual fashion.  She reports feeling "washed out" afterwards.  She is unaware of any triggers or precipitants for episodes.  Episodes occur without pattern 1-2 times per week. She frequently has atypical chest "aching" and "heaviness".  She has this in the office today and reports that it occurs frequently with associated SOB. Today, she denies symptoms of exerertional chest pain,  orthopnea, PND, or lower extremity edema. The patient is tolerating medications without difficulties and is otherwise without complaint today.   Past Medical History  Diagnosis Date  . Anxiety   . Allergic rhinitis   . Insomnia   . Hot flashes   . GERD (gastroesophageal reflux disease)   . Depression   . IBS (irritable bowel syndrome)   . Migraines   . Tachycardia    Past Surgical History  Procedure Laterality Date  . Vaginal hysterectomy    . Cesarean section      Current Outpatient Prescriptions  Medication Sig Dispense Refill  . acyclovir (ZOVIRAX) 400 MG tablet as needed.      . ALPRAZolam (XANAX XR) 1 MG 24 hr tablet as needed.      . metoprolol tartrate (LOPRESSOR) 25 MG tablet Take 1 tablet by mouth 2 (two) times daily.      . simvastatin (ZOCOR) 20 MG tablet Take 1 tablet by mouth daily.       No current facility-administered medications for this visit.    Allergies    Allergen Reactions  . Latex   . Other     Problems with stitches    History   Social History  . Marital Status: Legally Separated    Spouse Name: N/A    Number of Children: N/A  . Years of Education: N/A   Occupational History  . Not on file.   Social History Main Topics  . Smoking status: Never Smoker   . Smokeless tobacco: Not on file  . Alcohol Use: Yes     Comment: rare  . Drug Use: No  . Sexual Activity: Not on file   Other Topics Concern  . Not on file   Social History Narrative   Lives in [place] with son age 43.  Works as a Special educational needs teacher.    Family History  Problem Relation Age of Onset  . Heart attack Father   . Alcohol abuse Father   . Heart attack Paternal Grandfather   . Heart Problems      both sides of family    ROS- All systems are reviewed and negative except as per the HPI above  Physical Exam: Filed Vitals:   10/28/12 1026  BP: 112/77  Pulse: 84  Height: 5\' 1"  (1.549 m)  Weight: 120 lb 1.9 oz (54.486 kg)    GEN- The patient is well appearing, alert and oriented x 3 today.   Head- normocephalic, atraumatic Eyes-  Sclera clear, conjunctiva pink  Ears- hearing intact Oropharynx- clear Neck- supple, no JVP Lymph- no cervical lymphadenopathy Lungs- Clear to ausculation bilaterally, normal work of breathing Heart- Regular rate and rhythm, no murmurs, rubs or gallops, PMI not laterally displaced GI- soft, NT, ND, + BS Extremities- no clubbing, cyanosis, or edema MS- no significant deformity or atrophy Skin- no rash or lesion Psych- anxious appearing, euthymic mood, full affect Neuro- strength and sensation are intact  EKG  Today reveals sinus rhythm 73 bpm, PR 142 msec, otherwise normal ekg Echo 10/07/12- normal Event monitor reveals narrow complex regular SVT at 200 bpm on 10/22/12 which correlates to her symptoms  Assessment and Plan:  1. SVT The patient has symptomatic recurrent SVT.  She has not received improvement  with metoprolol.  Therapeutic strategies for supraventricular tachycardia including medicine and ablation were discussed in detail with the patient today. Risk, benefits, and alternatives to EP study and radiofrequency ablation were also discussed in detail today. These risks include but are not limited to stroke, bleeding, vascular damage, tamponade, perforation, damage to the heart and other structures, AV block requiring pacemaker, worsening renal function, and death. The patient understands these risk and wishes to proceed.  We will therefore proceed with catheter ablation at the next available time. She was instructed to hold metoprolol for 48 hours prior to the procedure by me today.

## 2012-11-18 NOTE — Interval H&P Note (Signed)
History and Physical Interval Note:  11/18/2012 6:56 AM  Kathy Barker  has presented today for surgery, with the diagnosis of svt  The various methods of treatment have been discussed with the patient and family. After consideration of risks, benefits and other options for treatment, the patient has consented to  Procedure(s): SUPRAVENTRICULAR TACHYCARDIA ABLATION (N/A) as a surgical intervention .  The patient's history has been reviewed, patient examined, no change in status, stable for surgery.  I have reviewed the patient's chart and labs.  Questions were answered to the patient's satisfaction.     Hillis Range

## 2012-11-18 NOTE — Preoperative (Signed)
Beta Blockers   Reason not to administer Beta Blockers:Not Applicable 

## 2012-11-18 NOTE — Brief Op Note (Signed)
S/p slow pathway modification for AVNRT. No early apparent complications  See dictation [phone]

## 2012-11-18 NOTE — Progress Notes (Signed)
Doing well s/p ablation She is stable and without complaint  Discharge to home Follow-up with me in 4 weeks

## 2012-11-18 NOTE — Transfer of Care (Signed)
Immediate Anesthesia Transfer of Care Note  Patient: Kathy Barker  Procedure(s) Performed: Procedure(s): SUPRAVENTRICULAR TACHYCARDIA ABLATION (N/A)  Patient Location: PACU  Anesthesia Type:MAC  Level of Consciousness: awake, alert , oriented and patient cooperative  Airway & Oxygen Therapy: Patient Spontanous Breathing  Post-op Assessment: Report given to PACU RN, Post -op Vital signs reviewed and stable and Patient moving all extremities X 4  Post vital signs: Reviewed and stable  Complications: No apparent anesthesia complications

## 2012-11-18 NOTE — Anesthesia Preprocedure Evaluation (Addendum)
Anesthesia Evaluation  Patient identified by MRN, date of birth, ID band Patient awake    Reviewed: Allergy & Precautions, H&P , NPO status , Patient's Chart, lab work & pertinent test results, reviewed documented beta blocker date and time   History of Anesthesia Complications (+) PONV  Airway Mallampati: II TM Distance: >3 FB Neck ROM: Full    Dental  (+) Teeth Intact and Dental Advisory Given   Pulmonary neg pulmonary ROS,          Cardiovascular + dysrhythmias Supra Ventricular Tachycardia     Neuro/Psych  Headaches, Anxiety Depression    GI/Hepatic Neg liver ROS, GERD-  Poorly Controlled and Medicated,  Endo/Other  negative endocrine ROS  Renal/GU negative Renal ROS     Musculoskeletal negative musculoskeletal ROS (+)   Abdominal   Peds  Hematology negative hematology ROS (+)   Anesthesia Other Findings   Reproductive/Obstetrics                           Anesthesia Physical Anesthesia Plan  ASA: I  Anesthesia Plan: MAC   Post-op Pain Management:    Induction: Intravenous  Airway Management Planned: Nasal Cannula and Simple Face Mask  Additional Equipment:   Intra-op Plan:   Post-operative Plan:   Informed Consent: I have reviewed the patients History and Physical, chart, labs and discussed the procedure including the risks, benefits and alternatives for the proposed anesthesia with the patient or authorized representative who has indicated his/her understanding and acceptance.   Dental advisory given  Plan Discussed with: CRNA and Surgeon  Anesthesia Plan Comments:       Anesthesia Quick Evaluation

## 2012-11-18 NOTE — Anesthesia Postprocedure Evaluation (Signed)
Anesthesia Post Note  Patient: Kathy Barker  Procedure(s) Performed: Procedure(s) (LRB): SUPRAVENTRICULAR TACHYCARDIA ABLATION (N/A)  Anesthesia type: general  Patient location: PACU  Post pain: Pain level controlled  Post assessment: Patient's Cardiovascular Status Stable  Last Vitals:  Filed Vitals:   11/18/12 0557  BP: 138/83  Pulse: 61  Temp: 36.5 C  Resp: 20    Post vital signs: Reviewed and stable  Level of consciousness: sedated  Complications: No apparent anesthesia complications

## 2012-11-19 NOTE — Op Note (Signed)
NAMEMarland Barker  AVALIN, BRILEY.:  [id number]  MEDICAL RECORD NO.:  [id number]  LOCATION:  6C05C                        FACILITY:  [facility]  PHYSICIAN:  Hillis Range, MD       DATE OF BIRTH:  November 07, 1960  DATE OF PROCEDURE: DATE OF DISCHARGE:  11/18/2012                              OPERATIVE REPORT   SURGEON:  Hillis Range, MD.  PREPROCEDURE DIAGNOSIS:  Supraventricular tachycardia.  POSTPROCEDURE DIAGNOSIS:  Classic AV nodal reentrant tachycardia.  PROCEDURES: 1. Comprehensive electrophysiologic study. 2. Coronary sinus pacing and recording. 3. Mapping of supraventricular tachycardia. 4. Ablation of supraventricular tachycardia. 5. Arrhythmia induction with isoproterenol infusion.  INTRODUCTION:  Kathy Barker is a pleasant 52 year old female with longstanding symptomatic recurrent palpitations.  She has been documented to have a short RP tachycardia, which is adenosine sensitive. She has failed medical therapy with metoprolol.  She, therefore, presents today for EP study and radiofrequency ablation.  DESCRIPTION OF PROCEDURE:  Informed written consent was obtained, and the patient was brought to the electrophysiology lab in the fasting state.  She was adequately sedated with intravenous medications as outlined in the anesthesia report.  The patient's right neck and groin were prepped and draped in the usual sterile fashion by the EP lab staff.  Using a percutaneous Seldinger technique, one 6-French hemostasis sheath was placed in the right internal jugular vein.  Two 6- [place] and one 8-French hemostasis sheaths were placed in the right common femoral vein.  A 6-French curved Damato catheter was introduced through the right internal jugular vein and advanced into the coronary sinus for recording and pacing from this location.  Two 6-French quadripolar Josephson catheters were introduced through the right common femoral vein and advanced into the His bundle and  right ventricular apex positions respectively.  The patient presented to the electrophysiology lab in normal sinus rhythm.  Her PR interval measured 137 milliseconds with QRS duration of 73 milliseconds and QT interval of 408 milliseconds.  Her average RR interval measured 868 milliseconds.  Her AH interval measured 79 milliseconds with an HV interval of 38 milliseconds.  Ventricular pacing was performed, which revealed midline concentric decremental VA conduction with a VA Wenckebach cycle length of 380 milliseconds.  No arrhythmias were observed.  With ventricular extra stimulus testing, she was again observed to have midline concentric and decremental VA conduction.  There were no retrograde jumps, echo beats, or tachycardias observed.  The ventricular ERP was 500/240 milliseconds.  Rapid atrial pacing was performed, which revealed PR clearly greater than RR; however, tachycardia could not be induced. The AV Wenckebach cycle length was 330 milliseconds.  Atrial extra stimulus testing was performed, which revealed marked AH prolongation clearly suggestive of slow pathway.  It was difficult, however, to see an AH jump.  The patient did have frequent echo beats observed.  The atrial ERP was 500/230 milliseconds.  Tachycardia was not induced. Isoproterenol was therefore infused at 2 mcg/minute.  Rapid atrial pacing was again performed, which revealed PR greater than RR.  Atrial extra stimulus testing revealed perhaps a subtle AH jump, but no classic AH jump.  The atrial ERP was 500/230 milliseconds.  Atrial extra stimulus testing  was therefore repeated with a cycle length of 450 milliseconds.  There was no AH jump or tachycardias induced.  The atrial ERP was 450/200 milliseconds.  Isoproterenol was continued and a gradual increase in AV nodal response was observed.  Finally, the atrium was paced down to a cycle length of 250 milliseconds with one-to-one AV conduction and PR greater than  RR.  At 230 milliseconds, tachycardia was induced.  This was a one-to-one tachycardia with the earliest retrograde atrial activation recorded from His electrogram and the VA time measuring 20 milliseconds.  This was felt to represent classic AV nodal reentrant tachycardia.  Ventricular pacing was performed during tachycardia, which revealed a VAV response.  Additionally PVCs were delivered during His refractoriness, which did not advance to terminate the tachycardia.  The patient was therefore felt to have classic AV nodal reentrant tachycardia.  Isoproterenol was allowed to wash out.  I therefore elected to perform slow pathway modification.  A 7-French [employer] Webster 4-mm ablation catheter was introduced through the right common femoral vein and advanced into the right atrium.  Mapping along the atrial side of Koch's triangle was performed.  This demonstrated a relatively small triangle.  A single radiofrequency application was delivered at site 11 in Koch's triangle without result.  After 10 seconds of RF delivery, the catheter was positioned at site 7 in Koch's triangle.  A single radiofrequency application was delivered with a target temperature of 60 degrees at 50 watts for a duration of 30 seconds.  Accelerated junctional rhythm was observed with intact VA conduction.  After 30 seconds of RF, a single dropped beat was observed and therefore RF was discontinued.  Following ablation, atrial pacing was performed, which revealed an AV Wenckebach cycle length of 380 milliseconds.  Atrial extra stimulus testing was performed, which revealed decremental AV conduction with an AV nodal ERP of 500/320 milliseconds.  Ventricular pacing was performed, which revealed midline concentric decremental VA conduction with a VA Wenckebach cycle length of 350 milliseconds.  Ventricular extra stimulus testing revealed concentric and decremental VA conduction with no retrograde jumps, echo beats, or  tachycardias.  The ventricular ERP was 500/220 milliseconds. After a 10-minute waiting episode, rapid atrial pacing was again performed, and the AV Wenckebach cycle length remained 380 milliseconds with no evidence of PR greater than RR.  With atrial extra stimulus testing again, there were no AH jumps, echo beats, or tachycardias observed.  The atrial ERP was 500/330 milliseconds.  It was felt that the patient had a successful slow pathway modification.  Following ablation, the AH interval measured 85 milliseconds with an HV interval 44 milliseconds.  The procedure was therefore considered completed.  All catheters were removed and the sheaths were aspirated and flushed.  The sheaths were removed and hemostasis was assured.  There were no early apparent complications.  CONCLUSIONS: 1. Sinus rhythm upon presentation. 2. No evidence of accessory pathways. 3. The patient did have dual AV nodal physiology with easily inducible     classic AV nodal reentrant tachycardia with a cycle length during     tachycardia measuring 240 milliseconds.  AV nodal reentrant     tachycardia was successfully ablated along the slow pathway. 4. No inducible arrhythmias following ablation. 5. No early apparent complications.     Hillis Range, MD     JA/MEDQ  D:  11/18/2012  T:  11/19/2012  Job:  [id number]

## 2012-12-20 ENCOUNTER — Ambulatory Visit: Payer: BC Managed Care – PPO | Admitting: Internal Medicine

## 2013-01-27 ENCOUNTER — Encounter: Payer: Self-pay | Admitting: Internal Medicine

## 2013-01-27 ENCOUNTER — Ambulatory Visit (INDEPENDENT_AMBULATORY_CARE_PROVIDER_SITE_OTHER): Payer: BC Managed Care – PPO | Admitting: Internal Medicine

## 2013-01-27 VITALS — BP 141/91 | HR 88 | Ht 62.0 in | Wt 121.0 lb

## 2013-01-27 DIAGNOSIS — I498 Other specified cardiac arrhythmias: Secondary | ICD-10-CM

## 2013-01-27 DIAGNOSIS — F411 Generalized anxiety disorder: Secondary | ICD-10-CM

## 2013-01-27 DIAGNOSIS — I471 Supraventricular tachycardia: Secondary | ICD-10-CM

## 2013-01-27 DIAGNOSIS — F419 Anxiety disorder, unspecified: Secondary | ICD-10-CM

## 2013-01-27 NOTE — Patient Instructions (Signed)
Your physician recommends that you schedule a follow-up appointment in 3 months with Dr Allred    

## 2013-01-27 NOTE — Progress Notes (Signed)
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 female who presents today for routine electrophysiology followup.  Since her SVT ablation, the patient reports doing very well.  She denies further SVT.  She does have gradual onset/offset palpitations at 100 bpm that occurs with stress/ anxiety.  She reports a lot of job related stress.  We talked about this and lifestyle modification for about 30 minutes today.  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  Today, she denies symptoms of chest pain, shortness of breath,  lower extremity edema, dizziness, presyncope, or syncope.  The patient is otherwise without complaint today.   Past Medical History  Diagnosis Date  . Allergic rhinitis   . Insomnia   . Hot flashes   . GERD (gastroesophageal reflux disease)   . IBS (irritable bowel syndrome)   . 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 PONV (postoperative nausea and vomiting)   . High cholesterol   . Migraines     "lately more often; before SVT I'd get them ~ q 6 months" (11/18/2012)  . Depression     "only during my divorce" (11/18/2012)  . Anxiety     "only during my divorce" (11/18/2012)   Past Surgical History  Procedure Laterality Date  . Vaginal hysterectomy  ~ 2000  . Cesarean section  1992  . Supraventricular tachycardia ablation  11-18-2012    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  . Combined hysterectomy vaginal / oophorectomy / a&p repair  1999    Current Outpatient Prescriptions  Medication Sig Dispense Refill  . ALPRAZolam (XANAX XR) 1 MG 24 hr tablet Take 1 mg by mouth daily as needed (for anxiety).       . gabapentin (NEURONTIN) 100 MG capsule 100 mg daily.      . simvastatin (ZOCOR) 20 MG tablet Take 1 tablet by mouth daily.       No current facility-administered medications for this visit.    Physical Exam: Filed Vitals:   01/27/13 1435  BP:  141/91  Pulse: 88  Height: 5\' 2"  (1.575 m)  Weight: 121 lb (54.885 kg)    GEN- The patient is well appearing, alert and oriented x 3 today.   Head- normocephalic, atraumatic Eyes-  Sclera clear, conjunctiva pink Ears- hearing intact Oropharynx- clear Lungs- Clear to ausculation bilaterally, normal work of breathing Heart- Regular rate and rhythm, no murmurs, rubs or gallops, PMI not laterally displaced GI- soft, NT, ND, + BS Extremities- no clubbing, cyanosis, or edema  ekg today reveals sinus rhythm 88 bpm, otherwise normal ekg  Assessment and Plan:  1. SVT- resolved s/p ablation 2. Anxiety- she has a lot of work related anxiety.  This leads to episodes of palpitations which are most suggestive of sinus tach.  We discussed lifestyle modification including stress reduction for a prolonged period (30 minutes) today.  She will continue to work on this.  Return in 3 months

## 2013-05-04 ENCOUNTER — Encounter: Payer: BC Managed Care – PPO | Admitting: Internal Medicine

## 2013-05-05 ENCOUNTER — Encounter: Payer: Self-pay | Admitting: Internal Medicine

## 2013-05-18 NOTE — Progress Notes (Signed)
This encounter was created in error - please disregard.

## 2013-12-22 ENCOUNTER — Encounter (HOSPITAL_COMMUNITY): Payer: Self-pay | Admitting: Internal Medicine

## 2014-01-26 ENCOUNTER — Encounter (HOSPITAL_COMMUNITY): Payer: Self-pay | Admitting: Internal Medicine

## 2014-08-17 ENCOUNTER — Ambulatory Visit
Admission: RE | Admit: 2014-08-17 | Discharge: 2014-08-17 | Disposition: A | Discharge status: Home / Self Care | Payer: 59 | Source: Ambulatory Visit | Attending: Physician Assistant | Admitting: Physician Assistant

## 2014-08-17 ENCOUNTER — Other Ambulatory Visit: Payer: Self-pay | Admitting: Physician Assistant

## 2014-08-17 DIAGNOSIS — W19XXXA Unspecified fall, initial encounter: Secondary | ICD-10-CM

## 2014-10-25 ENCOUNTER — Ambulatory Visit
Admission: RE | Admit: 2014-10-25 | Discharge: 2014-10-25 | Disposition: A | Discharge status: Home / Self Care | Payer: BLUE CROSS/BLUE SHIELD | Source: Ambulatory Visit | Attending: Physician Assistant | Admitting: Physician Assistant

## 2014-10-25 ENCOUNTER — Other Ambulatory Visit: Payer: Self-pay | Admitting: Physician Assistant

## 2014-10-25 DIAGNOSIS — R059 Cough, unspecified: Secondary | ICD-10-CM

## 2014-10-25 DIAGNOSIS — R0789 Other chest pain: Secondary | ICD-10-CM

## 2014-10-25 DIAGNOSIS — R05 Cough: Secondary | ICD-10-CM

## 2015-02-27 ENCOUNTER — Other Ambulatory Visit: Payer: Self-pay | Admitting: Family Medicine

## 2015-02-27 DIAGNOSIS — R042 Hemoptysis: Secondary | ICD-10-CM

## 2015-02-28 ENCOUNTER — Ambulatory Visit
Admission: RE | Admit: 2015-02-28 | Discharge: 2015-02-28 | Disposition: A | Discharge status: Home / Self Care | Payer: BLUE CROSS/BLUE SHIELD | Source: Ambulatory Visit | Attending: Family Medicine | Admitting: Family Medicine

## 2015-02-28 DIAGNOSIS — R042 Hemoptysis: Secondary | ICD-10-CM

## 2015-02-28 MED ORDER — IOPAMIDOL (ISOVUE-300) INJECTION 61%
75.0000 mL | Freq: Once | INTRAVENOUS | Status: AC | PRN
  Administered 2015-02-28: 75 mL via INTRAVENOUS

## 2016-12-17 ENCOUNTER — Other Ambulatory Visit: Payer: Self-pay | Admitting: Family Medicine

## 2016-12-17 ENCOUNTER — Ambulatory Visit
Admission: RE | Admit: 2016-12-17 | Discharge: 2016-12-17 | Disposition: A | Discharge status: Home / Self Care | Payer: BLUE CROSS/BLUE SHIELD | Source: Ambulatory Visit | Attending: Family Medicine | Admitting: Family Medicine

## 2016-12-17 DIAGNOSIS — M542 Cervicalgia: Secondary | ICD-10-CM

## 2017-04-03 ENCOUNTER — Other Ambulatory Visit: Payer: Self-pay | Admitting: Family Medicine

## 2017-11-09 ENCOUNTER — Emergency Department (HOSPITAL_COMMUNITY)
Admission: EM | Admit: 2017-11-09 | Discharge: 2017-11-09 | Discharge status: Against Medical Advice | Payer: BLUE CROSS/BLUE SHIELD | Attending: Emergency Medicine | Admitting: Emergency Medicine

## 2017-11-09 ENCOUNTER — Encounter (HOSPITAL_COMMUNITY): Payer: Self-pay

## 2017-11-09 ENCOUNTER — Other Ambulatory Visit: Payer: Self-pay

## 2017-11-09 ENCOUNTER — Emergency Department (HOSPITAL_COMMUNITY): Payer: BLUE CROSS/BLUE SHIELD

## 2017-11-09 DIAGNOSIS — Z5321 Procedure and treatment not carried out due to patient leaving prior to being seen by health care provider: Secondary | ICD-10-CM

## 2017-11-09 DIAGNOSIS — R079 Chest pain, unspecified: Secondary | ICD-10-CM | POA: Diagnosis present

## 2017-11-09 DIAGNOSIS — Z5329 Procedure and treatment not carried out because of patient's decision for other reasons: Secondary | ICD-10-CM | POA: Diagnosis not present

## 2017-11-09 DIAGNOSIS — I471 Supraventricular tachycardia: Secondary | ICD-10-CM

## 2017-11-09 DIAGNOSIS — R002 Palpitations: Secondary | ICD-10-CM

## 2017-11-09 DIAGNOSIS — Z79899 Other long term (current) drug therapy: Secondary | ICD-10-CM | POA: Insufficient documentation

## 2017-11-09 DIAGNOSIS — R072 Precordial pain: Secondary | ICD-10-CM

## 2017-11-09 LAB — POCT I-STAT TROPONIN I: Troponin i, poc: 0 ng/mL (ref 0.00–0.08)

## 2017-11-09 LAB — BASIC METABOLIC PANEL
Anion gap: 6 (ref 5–15)
BUN: 9 mg/dL (ref 6–20)
CO2: 29 mmol/L (ref 22–32)
Calcium: 9.6 mg/dL (ref 8.9–10.3)
Chloride: 106 mmol/L (ref 98–111)
Creatinine, Ser: 0.72 mg/dL (ref 0.44–1.00)
GFR calc Af Amer: >60 mL/min (ref >60)
GFR calc non Af Amer: >60 mL/min (ref >60)
Glucose, Bld: 103 mg/dL — ABNORMAL HIGH (ref 70–99)
Potassium: 3.9 mmol/L (ref 3.5–5.1)
Sodium: 141 mmol/L (ref 135–145)

## 2017-11-09 LAB — I-STAT BETA HCG BLOOD, ED (NOT ORDERABLE): I-stat hCG, quantitative: <5 m[IU]/mL (ref <5)

## 2017-11-09 LAB — CBC
HCT: 42.9 % (ref 36.0–46.0)
Hemoglobin: 13.6 g/dL (ref 12.0–15.0)
MCH: 30.8 pg (ref 26.0–34.0)
MCHC: 31.7 g/dL (ref 30.0–36.0)
MCV: 97.1 fL (ref 80.0–100.0)
Platelets: 234 10*3/uL (ref 150–400)
RBC: 4.42 MIL/uL (ref 3.87–5.11)
RDW: 12.1 % (ref 11.5–15.5)
WBC: 6.2 10*3/uL (ref 4.0–10.5)
nRBC: 0 % (ref 0.0–0.2)

## 2017-11-09 NOTE — ED Notes (Signed)
Pt left without speaking to nurse or provider.

## 2017-11-09 NOTE — ED Triage Notes (Signed)
Patient c/o left chest pain and SOB x 2 days. Patient states she could feel her heart racing. Patient states she has had an ablation in the past.

## 2017-11-09 NOTE — ED Provider Notes (Signed)
[facility] DEPT Provider Note   CSN: [id number] Arrival date & time: 11/09/17  1454     History   Chief Complaint Chief Complaint  Patient presents with  . Chest Pain  . Shortness of Breath    HPI Kathy Barker is a 57 y.o. female.  HPI Patient reports she has a history of mitral valve prolapse.  She had an ablation for SVT in September 2014.  She reports for a few years now she has been getting episodes up to once a week of heart racing.  She wears an apple watch that sometimes tracks her heart rate up to 150.  It spontaneously resolved.  She has not sought follow-up with cardiology up to this point.  Reports last night however she started getting a feeling of her heart rate being elevated and pain in the left side of her chest.  She reports that the pain persisted for several hours this morning.  She reports that what precipitated her coming to the emergency department.  She reports that the time she just did not feel well generally.  She had somewhat of a headache and feeling short of breath.  The symptoms resolved shortly after arriving to the emergency department.  She reports at one point, she was recommended to take a beta-blocker but she did not tolerate it due to excessive fatigue.  She does drink some caffeine in the morning with coffee but does not drink any later on into the day.  Non-smoker.  Family history negative for early onset coronary artery disease.  No personal history of CAD. Past Medical History:  Diagnosis Date  . Allergic rhinitis   . Anxiety    "only during my divorce" (11/18/2012)  . Depression    "only during my divorce" (11/18/2012)  . GERD (gastroesophageal reflux disease)   . High cholesterol   . Hot flashes   . IBS (irritable bowel syndrome)   . Insomnia   . Migraines    "lately more often; before SVT I'd get them ~ q 6 months" (11/18/2012)  . PONV (postoperative nausea and vomiting)   . 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 SVT (supraventricular tachycardia) ([facility]) 10/28/2012  . Other specified cardiac dysrhythmias(427.89) 10/26/2012  . Anxiety   . Allergic rhinitis   . Insomnia   . Hot flashes   . GERD (gastroesophageal reflux disease)   . Depression   . IBS (irritable bowel syndrome)     Past Surgical History:  Procedure Laterality Date  . CESAREAN SECTION  1992  . COMBINED HYSTERECTOMY VAGINAL / OOPHORECTOMY / A&P REPAIR  1999  . 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 SUPRAVENTRICULAR TACHYCARDIA ABLATION N/A 11/18/2012   Procedure: SUPRAVENTRICULAR TACHYCARDIA ABLATION;  Surgeon: Coralyn Mark, MD;  Location: [facility] CATH [facility];  Service: Cardiovascular;  Laterality: N/A;  . VAGINAL HYSTERECTOMY  ~ 2000     OB History   None      Home Medications    Prior to Admission medications   Medication Sig Start Date End Date Taking? Authorizing Provider  sertraline (ZOLOFT) 100 MG tablet Take 100 mg by mouth daily. 09/22/17  Yes [provider]  simvastatin (ZOCOR) 20 MG tablet Take 1 tablet by mouth daily. 09/26/12  Yes [provider]  temazepam (RESTORIL) 15  MG capsule Take 15 mg by mouth at bedtime.   Yes [provider]    Family History Family History  Problem Relation Age of Onset  . Heart attack Father   . Alcohol abuse Father   . Heart attack Paternal Grandfather   . Heart Problems Unknown        both sides of family   Patient father died of complications of alcoholism in his 6s.  Mother is living without coronary artery disease.  Patient has uncles with MI and coronary artery disease onset in their 45s.  No one with early or unaccounted for sudden death. Social History Social History   Tobacco Use  . Smoking status: Never Smoker  . Smokeless tobacco: Never  Used  Substance Use Topics  . Alcohol use: Yes    Comment: 11/18/2012 "shot of brandy couple times/month"  . Drug use: No     Allergies   Other and Latex   Review of Systems Review of Systems 10 Systems reviewed and are negative for acute change except as noted in the HPI.  Physical Exam Updated Vital Signs BP (!) 147/76   Pulse 70   Temp 98.4 F (36.9 C) (Oral)   Resp 18   Ht 5\' 2"  (1.575 m)   Wt 54.4 kg   SpO2 95%   BMI 21.95 kg/m   Physical Exam  Constitutional: She is oriented to person, place, and time. She appears well-developed and well-nourished. No distress.  HENT:  Head: Normocephalic and atraumatic.  Eyes: EOM are normal.  Neck: Neck supple. No thyromegaly present.  Cardiovascular: Normal rate, regular rhythm, normal heart sounds and intact distal pulses.  Pulmonary/Chest: Effort normal and breath sounds normal.  Abdominal: Soft. She exhibits no distension. There is no tenderness. There is no guarding.  Musculoskeletal: Normal range of motion. She exhibits no edema or tenderness.  Neurological: She is alert and oriented to person, place, and time. No cranial nerve deficit. She exhibits normal muscle tone. Coordination normal.  Skin: Skin is warm and dry.  Psychiatric: She has a normal mood and affect.     ED Treatments / Results  Labs (all labs ordered are listed, but only abnormal results are displayed) Labs Reviewed  BASIC METABOLIC PANEL - Abnormal; Notable for the following components:      Result Value   Glucose, Bld 103 (*)    All other components within normal limits  CBC  I-STAT TROPONIN, ED  I-STAT BETA HCG BLOOD, ED ([facility], [facility], [facility] ONLY)  I-STAT BETA HCG BLOOD, ED (NOT ORDERABLE)  POCT I-STAT TROPONIN I  I-STAT TROPONIN, ED    EKG EKG Interpretation  Date/Time:  Monday November 09 2017 15:10:11 EDT Ventricular Rate:  75 PR Interval:    QRS Duration: 89 QT Interval:  374 QTC Calculation: 418 R Axis:   83 Text Interpretation:  Sinus  rhythm Baseline wander in lead(s) II aVF no change from previous Confirmed by Charlesetta Shanks [id number]) on 11/09/2017 6:14:47 PM   Radiology Dg Chest 2 View  Result Date: 11/09/2017 CLINICAL DATA:  Chest pain EXAM: CHEST - 2 VIEW COMPARISON:  Chest radiograph October 25, 2014 and chest CT February 28, 2015 FINDINGS: Lungs are clear. Heart size and pulmonary vascularity are normal. No adenopathy. No pneumothorax. No bone lesions. IMPRESSION: No edema or consolidation. Electronically Signed   By: Lowella Grip III M.D.   On: 11/09/2017 15:24    Procedures Procedures (including critical care time)  Medications Ordered in ED Medications - No  data to display   Initial Impression / Assessment and Plan / ED Course  I have reviewed the triage vital signs and the nursing notes.  Pertinent labs & imaging results that were available during my care of the patient were reviewed by me and considered in my medical decision making (see chart for details).  Clinical Course as of Nov 10 1906  Mon Nov 09, 2017  1900 Patient's mother announced that "we are leaving".  That includes the patient, and walked out. Eloped before completion of visit. Plan was for completing her 3-hour troponin.  Before first evaluation, patient and/or family members had expressed to other staff that they were upset about their wait time and had intention to just leave if they were not going to be seen.  They were seen by myself and diagnostic studies reviewed the full physical exam and review of history.  Patient was stable with normal vital signs and normal heart rate.  Diagnostic studies within normal limits.  Plan was reviewed for her 3-hour troponin and then plan for discharge to follow-up with cardiology.  Did apologize for wait times, although this patient was seen ahead of actual waiting room wait time with complete evaluation time at approximately 3 and half hours with plan for disposition in place.   [MP]    Clinical  Course User Index [MP] Charlesetta Shanks, MD    At this time, patient heart rate is regular in the 70s.  Symptoms have resolved.  I do have suspicion for recurrence of SVT.  Patient's heart score is 2.  Patient will need to follow-up with cardiology for reevaluation. Final Clinical Impressions(s) / ED Diagnoses   Final diagnoses:  Precordial pain  Palpitations  Paroxysmal SVT (supraventricular tachycardia) ([facility])  Eloped from emergency department    ED Discharge Orders    None       Charlesetta Shanks, MD 11/09/17 1909

## 2019-06-01 ENCOUNTER — Other Ambulatory Visit: Payer: Self-pay | Admitting: Radiology

## 2019-06-06 ENCOUNTER — Encounter: Payer: Self-pay | Admitting: Licensed Clinical Social Worker

## 2019-06-06 ENCOUNTER — Encounter: Payer: Self-pay | Admitting: *Deleted

## 2019-06-06 DIAGNOSIS — C50312 Malignant neoplasm of lower-inner quadrant of left female breast: Secondary | ICD-10-CM | POA: Insufficient documentation

## 2019-06-07 NOTE — Progress Notes (Signed)
[facility]  [phone] [phone]     ID: SELBY SLOVACEK DOB: [birth year]  MR#: [id number]  OHY#:[id number]  Patient Care Team: [name], MD as PCP - General (Family Medicine) Rockwell Germany, RN as Oncology Nurse Navigator Mauro Kaufmann, RN as Oncology Nurse Navigator [name], MD as Consulting Physician (General Surgery) Isaic Syler, [name], MD as Consulting Physician (Oncology) [name], MD as Attending Physician (Radiation Oncology) Lavonna Monarch, MD as Consulting Physician (Dermatology) Chauncey Cruel, MD OTHER MD:  CHIEF COMPLAINT: Left-sided breast cancer  CURRENT TREATMENT: Neoadjuvant chemotherapy   HISTORY OF CURRENT ILLNESS: Kathy Barker herself noted a palpable lower-inner left breast lump and immediately brought it to medical attention.. She underwent bilateral diagnostic mammography with tomography and left breast ultrasonography at [facility] on 05/30/2019 showing: breast density category B; 1.7 cm lobulated mass in left breast at 8 o'clock; no significant left axillary abnormalities.  Accordingly on 06/01/2019 she proceeded to biopsy of the left breast area in question. The pathology from this procedure ([id number]) showed: invasive ductal carcinoma, grade 3. Prognostic indicators significant for: estrogen receptor, 70% positive with weak staining intensity and progesterone receptor, 0% negative. Proliferation marker Ki67 at 80%. HER2 negative by immunohistochemistry (1+).  The patient's subsequent history is as detailed below.   INTERVAL HISTORY: [name] was evaluated in the multidisciplinary breast cancer clinic on 06/08/2019 accompanied by her mother, Kathy Barker. Her case was also presented at the multidisciplinary breast cancer conference on the same day. At that time a preliminary plan was proposed: Consider Oncotype but she will almost certainly need chemotherapy which will be given neoadjuvantly, followed by surgery  and radiation.  Of note, she reports a history of basal cell carcinoma to the skin on her face, cared for by Dr. Denna Haggard.   REVIEW OF SYSTEMS: On the provided questionnaire, Tawney reports fatigue that affects her activities, pain to her knee and hand, sinus problems, dry cough, heartburn, headaches, and anxiety. The patient denies visual changes, nausea, vomiting, stiff neck, dizziness, or gait imbalance. There has been no phlegm production, or pleurisy, no chest pain or pressure, and no change in bowel or bladder habits. The patient denies fever, rash, bleeding, or unexplained weight loss.  She has received her vaccine but the son living with her has not.  A detailed review of systems was otherwise entirely negative.   PAST MEDICAL HISTORY: Past Medical History:  Diagnosis Date  . Allergic rhinitis   . Anxiety    "only during my divorce" (11/18/2012)  . Depression    "only during my divorce" (11/18/2012)  . GERD (gastroesophageal reflux disease)   . High cholesterol   . Hot flashes   . IBS (irritable bowel syndrome)   . Insomnia   . Migraines    "lately more often; before SVT I'd get them ~ q 6 months" (11/18/2012)  . PONV (postoperative nausea and vomiting)   . 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 CESAREAN SECTION  1992  . COMBINED HYSTERECTOMY VAGINAL / OOPHORECTOMY / A&P REPAIR  1999  . 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 SUPRAVENTRICULAR TACHYCARDIA ABLATION N/A 11/18/2012   Procedure: SUPRAVENTRICULAR TACHYCARDIA ABLATION;  Surgeon: Coralyn Mark, MD;  Location: [facility];  Service: Cardiovascular;  Laterality: N/A;  . VAGINAL  HYSTERECTOMY  ~ 2000    FAMILY HISTORY: Family History  Problem Relation Age of Onset  . Heart attack Father   . Alcohol abuse Father   .  Heart attack Paternal Grandfather   . Heart Problems Other        both sides of family  . Breast cancer Mother    Her father died at age 15 from alcohol abuse. Her mother is living at age 57 (as of 05/2019) and has a history of DCIS at age 71 and invasive lobular breast cancer at age 60. Kathy Barker has one brother. She reports cancer of an unknown type in her maternal grandmother and prostate cancer in a maternal uncle.   GYNECOLOGIC HISTORY:  No LMP recorded. Patient has had a hysterectomy. Menarche: 59 years old Age at first live birth: 59 years old [place] P 2 LMP 2000 Contraceptive: never used HRT never used  Hysterectomy? yes BSO? 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 She is divorced. She lives at home with son [name], age 22, who works in Biomedical scientist in [place]. Son [name], age 60, works in Sales executive here in [place]. 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     ADVANCED DIRECTIVES: Not in place. She intends to name both of her sons as her 41.   HEALTH MAINTENANCE: Social History   Tobacco Use  . Smoking status: Never Smoker  . Smokeless tobacco: Never Used  Substance Use Topics  . Alcohol use: Yes    Comment: 11/18/2012 "shot of brandy couple times/month"  . Drug use: No     Colonoscopy: yes, date unsure  PAP: date unsure  Bone density: never done   Allergies  Allergen Reactions  . Other     Problems with stitches  . Latex Rash    Current Outpatient Medications  Medication Sig Dispense Refill  . cetirizine (ZYRTEC) 10 MG tablet Take 10 mg by mouth daily.    [name] LIVALO 4 MG TABS Take 1 tablet by mouth daily.    . sertraline (ZOLOFT) 100 MG tablet Take 100 mg by mouth daily.    . SYMBICORT 160-4.5 MCG/ACT inhaler Inhale 2 puffs into the lungs 2 (two) times daily.    . temazepam (RESTORIL) 15 MG capsule Take 15 mg by mouth at bedtime.     No current facility-administered  medications for this visit.    OBJECTIVE: White woman in no acute distress  Vitals:   06/08/19 0907  BP: 134/68  Pulse: 78  Resp: 20  Temp: 98.7 F (37.1 C)  SpO2: 99%     Body mass index is 23.33 kg/m.   Wt Readings from Last 3 Encounters:  06/08/19 129 lb 9.6 oz (58.8 kg)  11/09/17 120 lb (54.4 kg)  01/27/13 121 lb (54.9 kg)      ECOG FS:1 - Symptomatic but completely ambulatory  Ocular: Sclerae unicteric, pupils round and equal Ear-nose-throat: Wearing a mask Lymphatic: No cervical or supraclavicular adenopathy Lungs no rales or rhonchi Heart regular rate and rhythm Abd soft, nontender, positive bowel sounds MSK no focal spinal tenderness, no joint edema Neuro: non-focal, well-oriented, appropriate affect Breasts: The right breast is unremarkable.  The mass in the lower inner quadrant of the left breast is easily palpable, movable, appears to be less than 2 cm, with no skin involvement and no nipple involvement.  The left axilla is benign.   LAB RESULTS:  CMP  Component Value Date/Time   NA 142 06/08/2019 0831   K 4.6 06/08/2019 0831   CL 107 06/08/2019 0831   CO2 27 06/08/2019 0831   GLUCOSE 100 (H) 06/08/2019 0831   BUN 8 06/08/2019 0831   CREATININE 0.79 06/08/2019 0831   CALCIUM 9.3 06/08/2019 0831   PROT 6.7 06/08/2019 0831   ALBUMIN 4.0 06/08/2019 0831   AST 17 06/08/2019 0831   ALT 23 06/08/2019 0831   ALKPHOS 98 06/08/2019 0831   BILITOT 0.3 06/08/2019 0831   GFRNONAA >60 06/08/2019 0831   GFRAA >60 06/08/2019 0831    No results found for: TOTALPROTELP, ALBUMINELP, A1GS, A2GS, BETS, BETA2SER, GAMS, MSPIKE, SPEI  Lab Results  Component Value Date   WBC 6.8 06/08/2019   NEUTROABS 4.1 06/08/2019   HGB 13.4 06/08/2019   HCT 41.6 06/08/2019   MCV 95.2 06/08/2019   PLT 178 06/08/2019    No results found for: LABCA2  No components found for: [id number]  No results for input(s): INR in the last 168 hours.  No results found for:  LABCA2  No results found for: XQJ194  No results found for: RDE081  No results found for: KGY185  No results found for: CA2729  No components found for: HGQUANT  No results found for: CEA1 / No results found for: CEA1   No results found for: AFPTUMOR  No results found for: CHROMOGRNA  No results found for: KPAFRELGTCHN, LAMBDASER, KAPLAMBRATIO (kappa/lambda light chains)  No results found for: HGBA, HGBA2QUANT, HGBFQUANT, HGBSQUAN (Hemoglobinopathy evaluation)   No results found for: LDH  No results found for: IRON, TIBC, IRONPCTSAT (Iron and TIBC)  No results found for: FERRITIN  Urinalysis No results found for: COLORURINE, APPEARANCEUR, LABSPEC, PHURINE, GLUCOSEU, HGBUR, BILIRUBINUR, KETONESUR, PROTEINUR, UROBILINOGEN, NITRITE, LEUKOCYTESUR   STUDIES: No results found.   ELIGIBLE FOR AVAILABLE RESEARCH PROTOCOL: no  ASSESSMENT: 59 y.o. [place] woman   PLAN: We first reviewed the fact that cancer is not one disease but more than 100 different diseases and that it is important to keep them separate-- otherwise when friends and relatives discuss their own cancer experiences with Maudie Mercury confusion can result. Similarly we explained that if breast cancer spreads to the bone or liver, the patient would not have bone cancer or liver cancer, but breast cancer in the bone and breast cancer in the liver: one cancer in three places-- not 3 different cancers which otherwise would have to be treated in 3 different ways.  We discussed the difference between local and systemic therapy. In terms of loco-regional treatment, lumpectomy plus radiation is equivalent to mastectomy as far as survival is concerned. For this reason, and because the cosmetic results are generally superior, we recommend breast conserving surgery.   We also noted that in terms of sequencing of treatments, whether systemic therapy or surgery is done first does not affect the ultimate outcome.  We do recommend  neoadjuvant chemotherapy in her case as it will make the surgery easier (better margins, better cosmesis) and also will give [place] time for genetics results and information regarding response.  We then discussed the rationale for systemic therapy. There is some risk that this cancer Barker have already spread to other parts of her body. Patients frequently ask at this point about bone scans, CAT scans and PET scans to find out if they have occult breast cancer somewhere else. The problem is that in early stage disease we are much more likely to find false positives then true cancers and this  would expose the patient to unnecessary procedures as well as unnecessary radiation. Scans cannot answer the question the patient really would like to know, which is whether she has microscopic disease elsewhere in her body. For those reasons we do not recommend them.  Of course we would proceed to aggressive evaluation of any symptoms that might suggest metastatic disease, but that is not the case here.  Next we went over the options for systemic therapy which are anti-estrogens, anti-HER-2 immunotherapy, and chemotherapy. Willene does not meet criteria for anti-HER-2 immunotherapy. She is a questionable candidate for anti-estrogens and we will repeat the prognostic panel on any residual cancer after neoadjuvant treatment.  We will also see whether there is estrogen gene activity on the Oncotype  As for chemotherapy, it is most effective in rapidly growing, aggressive tumors like Shailyn 's.  In my opinion she warrants chemotherapy but we are going to confirm this with an Oncotype, which I expect will indeed predict a significant benefit from chemotherapy  More specifically she will receive doxorubicin and cyclophosphamide in dose dense fashion followed by weekly paclitaxel x12.  We will try to start on 06/21/2019.  She will have an echocardiogram, meet with our chemotherapy teaching nurse, and undergo genetics testing.  She will  see me again on 06/17/2019 to make sure within is in place.  Phyillis has a good understanding of the overall plan. She agrees with it. She knows the goal of treatment in her case is cure. She will call with any problems that Barker develop before her next visit here.  Total encounter time 70 minutes.Sarajane Jews C. Adelaido Nicklaus, MD 06/08/2019 5:47 PM Medical Oncology and Hematology [facility] [place], [place] [address] Tel. [phone]    Fax. [phone]   This document serves as a record of services personally performed by Lurline Del, MD. It was created on his behalf by Wilburn Mylar, a trained medical scribe. The creation of this record is based on the scribe's personal observations and the provider's statements to them.   I, Lurline Del MD, have reviewed the above documentation for accuracy and completeness, and I agree with the above.   *Total Encounter Time as defined by the Centers for Medicare and Medicaid Services includes, in addition to the face-to-face time of a patient visit (documented in the note above) non-face-to-face time: obtaining and reviewing outside history, ordering and reviewing medications, tests or procedures, care coordination (communications with other health care professionals or caregivers) and documentation in the medical record.

## 2019-06-08 ENCOUNTER — Encounter: Payer: Self-pay | Admitting: Physical Therapy

## 2019-06-08 ENCOUNTER — Ambulatory Visit
Admission: RE | Admit: 2019-06-08 | Discharge: 2019-06-08 | Disposition: A | Discharge status: Home / Self Care | Payer: Managed Care, Other (non HMO) | Source: Ambulatory Visit | Attending: Radiation Oncology | Admitting: Radiation Oncology

## 2019-06-08 ENCOUNTER — Other Ambulatory Visit: Payer: Self-pay

## 2019-06-08 ENCOUNTER — Encounter: Payer: Self-pay | Admitting: *Deleted

## 2019-06-08 ENCOUNTER — Encounter: Payer: Self-pay | Admitting: Licensed Clinical Social Worker

## 2019-06-08 ENCOUNTER — Encounter: Payer: Self-pay | Admitting: Oncology

## 2019-06-08 ENCOUNTER — Inpatient Hospital Stay (HOSPITAL_BASED_OUTPATIENT_CLINIC_OR_DEPARTMENT_OTHER): Payer: 59 | Admitting: Oncology

## 2019-06-08 ENCOUNTER — Other Ambulatory Visit: Payer: Self-pay | Admitting: *Deleted

## 2019-06-08 ENCOUNTER — Inpatient Hospital Stay: Payer: 59

## 2019-06-08 ENCOUNTER — Telehealth: Payer: Self-pay | Admitting: *Deleted

## 2019-06-08 ENCOUNTER — Encounter: Payer: Self-pay | Admitting: Radiation Oncology

## 2019-06-08 ENCOUNTER — Ambulatory Visit: Payer: 59 | Attending: General Surgery | Admitting: Physical Therapy

## 2019-06-08 ENCOUNTER — Ambulatory Visit: Payer: Self-pay | Admitting: General Surgery

## 2019-06-08 VITALS — BP 134/68 | HR 78 | Temp 98.7°F | Resp 20 | Ht 62.5 in | Wt 129.6 lb

## 2019-06-08 DIAGNOSIS — R5383 Other fatigue: Secondary | ICD-10-CM

## 2019-06-08 DIAGNOSIS — Z811 Family history of alcohol abuse and dependence: Secondary | ICD-10-CM

## 2019-06-08 DIAGNOSIS — C50312 Malignant neoplasm of lower-inner quadrant of left female breast: Secondary | ICD-10-CM

## 2019-06-08 DIAGNOSIS — Z79899 Other long term (current) drug therapy: Secondary | ICD-10-CM | POA: Insufficient documentation

## 2019-06-08 DIAGNOSIS — M79643 Pain in unspecified hand: Secondary | ICD-10-CM

## 2019-06-08 DIAGNOSIS — Z90721 Acquired absence of ovaries, unilateral: Secondary | ICD-10-CM

## 2019-06-08 DIAGNOSIS — Z8249 Family history of ischemic heart disease and other diseases of the circulatory system: Secondary | ICD-10-CM

## 2019-06-08 DIAGNOSIS — R293 Abnormal posture: Secondary | ICD-10-CM | POA: Diagnosis present

## 2019-06-08 DIAGNOSIS — M25569 Pain in unspecified knee: Secondary | ICD-10-CM | POA: Insufficient documentation

## 2019-06-08 DIAGNOSIS — Z803 Family history of malignant neoplasm of breast: Secondary | ICD-10-CM

## 2019-06-08 DIAGNOSIS — Z170 Estrogen receptor positive status [ER+]: Secondary | ICD-10-CM | POA: Insufficient documentation

## 2019-06-08 LAB — CBC WITH DIFFERENTIAL (CANCER CENTER ONLY)
Abs Immature Granulocytes: 0.02 10*3/uL (ref 0.00–0.07)
Basophils Absolute: 0 10*3/uL (ref 0.0–0.1)
Basophils Relative: 1 %
Eosinophils Absolute: 0.1 10*3/uL (ref 0.0–0.5)
Eosinophils Relative: 2 %
HCT: 41.6 % (ref 36.0–46.0)
Hemoglobin: 13.4 g/dL (ref 12.0–15.0)
Immature Granulocytes: 0 %
Lymphocytes Relative: 29 %
Lymphs Abs: 2 10*3/uL (ref 0.7–4.0)
MCH: 30.7 pg (ref 26.0–34.0)
MCHC: 32.2 g/dL (ref 30.0–36.0)
MCV: 95.2 fL (ref 80.0–100.0)
Monocytes Absolute: 0.5 10*3/uL (ref 0.1–1.0)
Monocytes Relative: 8 %
Neutro Abs: 4.1 10*3/uL (ref 1.7–7.7)
Neutrophils Relative %: 60 %
Platelet Count: 178 10*3/uL (ref 150–400)
RBC: 4.37 MIL/uL (ref 3.87–5.11)
RDW: 12.8 % (ref 11.5–15.5)
WBC Count: 6.8 10*3/uL (ref 4.0–10.5)
nRBC: 0 % (ref 0.0–0.2)

## 2019-06-08 LAB — CMP (CANCER CENTER ONLY)
ALT: 23 U/L (ref 0–44)
AST: 17 U/L (ref 15–41)
Albumin: 4 g/dL (ref 3.5–5.0)
Alkaline Phosphatase: 98 U/L (ref 38–126)
Anion gap: 8 (ref 5–15)
BUN: 8 mg/dL (ref 6–20)
CO2: 27 mmol/L (ref 22–32)
Calcium: 9.3 mg/dL (ref 8.9–10.3)
Chloride: 107 mmol/L (ref 98–111)
Creatinine: 0.79 mg/dL (ref 0.44–1.00)
GFR, Est AFR Am: >60 mL/min (ref >60)
GFR, Estimated: >60 mL/min (ref >60)
Glucose, Bld: 100 mg/dL — ABNORMAL HIGH (ref 70–99)
Potassium: 4.6 mmol/L (ref 3.5–5.1)
Sodium: 142 mmol/L (ref 135–145)
Total Bilirubin: 0.3 mg/dL (ref 0.3–1.2)
Total Protein: 6.7 g/dL (ref 6.5–8.1)

## 2019-06-08 LAB — GENETIC SCREENING ORDER

## 2019-06-08 MED ORDER — LIDOCAINE-PRILOCAINE 2.5-2.5 % EX CREA: TOPICAL_CREAM | CUTANEOUS | 3 refills | Status: DC

## 2019-06-08 MED ORDER — PROCHLORPERAZINE MALEATE 10 MG PO TABS: 10.0000 mg | ORAL_TABLET | Freq: Four times a day (QID) | ORAL | 1 refills | Status: DC | PRN

## 2019-06-08 MED ORDER — DEXAMETHASONE 4 MG PO TABS: ORAL_TABLET | ORAL | 1 refills | Status: DC

## 2019-06-08 MED ORDER — LORATADINE 10 MG PO TABS: 10.0000 mg | ORAL_TABLET | Freq: Every day | ORAL | 3 refills | Status: DC

## 2019-06-08 MED ORDER — LORAZEPAM 0.5 MG PO TABS: 0.5000 mg | ORAL_TABLET | Freq: Every evening | ORAL | 0 refills | Status: DC | PRN

## 2019-06-08 NOTE — Therapy (Addendum)
[facility] [place], [place], [address] Phone: [phone]   Fax:  [phone]  Physical Therapy Evaluation  Patient Details  Name: Kathy Barker MRN: [id number] Date of Birth: 08-04-1960 Referring Provider (PT): Dr. Autumn Messing   Encounter Date: 06/08/2019  PT End of Session - 06/08/19 0948    Visit Number  1    Number of Visits  2    Date for PT Re-Evaluation  12/09/19    PT Start Time  0952    PT Stop Time  1016    PT Time Calculation (min)  24 min    Activity Tolerance  Patient tolerated treatment well    Behavior During Therapy  [facility] for tasks assessed/performed       Past Medical History:  Diagnosis Date  . Allergic rhinitis   . Anxiety    "only during my divorce" (11/18/2012)  . Depression    "only during my divorce" (11/18/2012)  . GERD (gastroesophageal reflux disease)   . High cholesterol   . Hot flashes   . IBS (irritable bowel syndrome)   . Insomnia   . Migraines    "lately more often; before SVT I'd get them ~ q 6 months" (11/18/2012)  . PONV (postoperative nausea and vomiting)   . 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 CESAREAN SECTION  1992  . COMBINED HYSTERECTOMY VAGINAL / OOPHORECTOMY / A&P REPAIR  1999  . 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 SUPRAVENTRICULAR TACHYCARDIA ABLATION N/A 11/18/2012   Procedure: SUPRAVENTRICULAR TACHYCARDIA ABLATION;  Surgeon: Coralyn Mark, MD;  Location: [facility];  Service: Cardiovascular;  Laterality: N/A;  . VAGINAL HYSTERECTOMY  ~ 2000    There were no vitals filed for this visit.   Subjective Assessment - 06/08/19 0929    Subjective  Patient reports she is here today to be seen by her medical team for her newly diagnosed left breast cancer.    Pertinent  History  Patient was diagnosed on 05/30/2019 with left grade III invasive ductal carcinoma breast cancer. It measures 1.3 cm and is located in the lower inner quadrant. It is ER positive, PR negative and HER2 negative with a Ki67 of 80%. She has a hx of cardiac arrythmias and an ablation.    Patient Stated Goals  Reduce lymphedema risk and learn post op shoulder ROM HEP    Currently in Pain?  Yes    Pain Score  --   Varies   Pain Location  Finger (Comment which one)   Left thumb   Pain Orientation  Left    Pain Descriptors / Indicators  Aching    Pain Type  Chronic pain    Pain Onset  More than a month ago    Pain Frequency  Intermittent    Aggravating Factors   Typing, writing, etc    Pain Relieving Factors  Rest         [facility] PT Assessment - 06/08/19 0001      Assessment   Medical Diagnosis  Left breast cancer    Referring Provider (PT)  Dr. Autumn Messing    Onset Date/Surgical Date  05/30/19    Hand Dominance  Right    Prior Therapy  none      Precautions   Precautions  Other (  comment)    Precaution Comments  active cancer      Restrictions   Weight Bearing Restrictions  No      Balance Screen   Has the patient fallen in the past 6 months  No    Has the patient had a decrease in activity level because of a fear of falling?   No    Is the patient reluctant to leave their home because of a fear of falling?   No      Home Environment   Living Environment  Private residence    Living Arrangements  Alone    Available Help at Discharge  Family      Prior Function   Level of Independence  Independent    Vocation  Full time employment    Tax inspector    Leisure  She does not exercise      Cognition   Overall Cognitive Status  Within Functional Limits for tasks assessed      Posture/Postural Control   Posture/Postural Control  Postural limitations    Postural Limitations  Rounded Shoulders;Forward head      ROM / Strength   AROM / PROM /  Strength  AROM;Strength      AROM   AROM Assessment Site  Shoulder    Right/Left Shoulder  Right;Left    Right Shoulder Extension  46 Degrees    Right Shoulder Flexion  160 Degrees    Right Shoulder ABduction  175 Degrees    Right Shoulder Internal Rotation  61 Degrees    Right Shoulder External Rotation  90 Degrees    Left Shoulder Extension  63 Degrees    Left Shoulder Flexion  164 Degrees    Left Shoulder ABduction  171 Degrees    Left Shoulder Internal Rotation  72 Degrees    Left Shoulder External Rotation  86 Degrees      Strength   Overall Strength  Within functional limits for tasks performed        LYMPHEDEMA/ONCOLOGY QUESTIONNAIRE - 06/08/19 0946      Type   Cancer Type  Left breast cancer      Lymphedema Assessments   Lymphedema Assessments  Upper extremities      Right Upper Extremity Lymphedema   10 cm Proximal to Olecranon Process  26.5 cm    Olecranon Process  23.6 cm    10 cm Proximal to Ulnar Styloid Process  22.6 cm    Just Proximal to Ulnar Styloid Process  16.6 cm    Across Hand at [employer]  18.9 cm    At [place] of 2nd Digit  6.1 cm      Left Upper Extremity Lymphedema   10 cm Proximal to Olecranon Process  25.2 cm    Olecranon Process  22.7 cm    10 cm Proximal to Ulnar Styloid Process  20.3 cm    Just Proximal to Ulnar Styloid Process  15.3 cm    Across Hand at [employer]  17.5 cm    At [place] of 2nd Digit  5.8 cm      L-DEX FLOWSHEETS - 06/08/19 0900      L-DEX LYMPHEDEMA SCREENING   Comment  L-Dex not performed due to hx cardiac arrythmias           Quick Dash - 06/08/19 0001    Open a tight or new jar  Severe difficulty    Do heavy household chores (wash walls,  wash floors)  Mild difficulty    Carry a shopping bag or briefcase  Mild difficulty    Wash your back  Mild difficulty    Use a knife to cut food  Moderate difficulty    Recreational activities in which you take some force or impact through your arm, shoulder, or  hand (golf, hammering, tennis)  Moderate difficulty    During the past week, to what extent has your arm, shoulder or hand problem interfered with your normal social activities with family, friends, neighbors, or groups?  Modererately    During the past week, to what extent has your arm, shoulder or hand problem limited your work or other regular daily activities  Modererately    Arm, shoulder, or hand pain.  Moderate    Tingling (pins and needles) in your arm, shoulder, or hand  Moderate    Difficulty Sleeping  Moderate difficulty    DASH Score  45.45 %        Objective measurements completed on examination: See above findings.       Patient was instructed today in a home exercise program today for post op shoulder range of motion. These included active assist shoulder flexion in sitting, scapular retraction, wall walking with shoulder abduction, and hands behind head external rotation.  She was encouraged to do these twice a day, holding 3 seconds and repeating 5 times when permitted by her physician.           PT Education - 06/08/19 0947    Education Details  Lymphedema risk reduction and post op shoulder ROM HEP    Person(s) Educated  Patient    Methods  Explanation;Demonstration;Handout    Comprehension  Returned demonstration;Verbalized understanding          PT Long Term Goals - 06/08/19 1041      PT LONG TERM GOAL #1   Title  Patient will demonstrate she has regained full shoulder ROM and function post operatively compared to baselines.    Time  6    Period  Months    Status  New    Target Date  12/09/19      Breast Clinic Goals - 06/08/19 1040      Patient will be able to verbalize understanding of pertinent lymphedema risk reduction practices relevant to her diagnosis specifically related to skin care.   Time  1    Period  Days    Status  Achieved      Patient will be able to return demonstrate and/or verbalize understanding of the post-op home  exercise program related to regaining shoulder range of motion.   Time  1    Period  Days    Status  Achieved      Patient will be able to verbalize understanding of the importance of attending the postoperative After Breast Cancer Class for further lymphedema risk reduction education and therapeutic exercise.   Time  1    Period  Days    Status  Achieved            Plan - 06/08/19 0948    Clinical Impression Statement  Patient was diagnosed on 05/30/2019 with left grade III invasive ductal carcinoma breast cancer. It measures 1.3 cm and is located in the lower inner quadrant. It is ER positive, PR negative and HER2 negative with a Ki67 of 80%. She has a hx of cardiac arrythmias and an ablation. Her multidisciplinary medical team met prior to her assessments to determine a  recommended treatment plan. She is planning to have neoadjuvant chemotherapy depending on Oncotype results on her core iopsy, followed by radiation and anti-estrogen therapy. She will benefit from a post op PT reassessment to determine needs.    Stability/Clinical Decision Making  Stable/Uncomplicated    Clinical Decision Making  Low    Rehab Potential  Excellent    PT Frequency  --   Eval and 1 f/u visit   PT Treatment/Interventions  ADLs/Self Care Home Management;Therapeutic exercise;Patient/family education    PT Next Visit Plan  Will reassess 3-4 weeks post op to determine needs    PT Home Exercise Plan  Post op shoulder ROM HEP    Consulted and Agree with Plan of Care  Patient;Family member/caregiver    Family Member Consulted  Mother       Patient will benefit from skilled therapeutic intervention in order to improve the following deficits and impairments:  Postural dysfunction, Decreased range of motion, Impaired UE functional use, Decreased knowledge of precautions  Visit Diagnosis: Malignant neoplasm of lower-inner quadrant of left breast in female, estrogen receptor positive ([place]) - Plan: PT plan of care  cert/re-cert  Abnormal posture - Plan: PT plan of care cert/re-cert   Patient will follow up at outpatient cancer rehab 3-4 weeks following surgery.  If the patient requires physical therapy at that time, a specific plan will be dictated and sent to the referring physician for approval. The patient was educated today on appropriate basic range of motion exercises to begin post operatively and the importance of attending the After Breast Cancer class following surgery.  Patient was educated today on lymphedema risk reduction practices as it pertains to recommendations that will benefit the patient immediately following surgery.  She verbalized good understanding.      Problem List Patient Active Problem List   Diagnosis Date Noted  . Malignant neoplasm of lower-inner quadrant of left breast in female, estrogen receptor positive ([place]) 06/06/2019  . SVT (supraventricular tachycardia) ([place]) 10/28/2012  . Other specified cardiac dysrhythmias(427.89) 10/26/2012  . Anxiety   . Allergic rhinitis   . Insomnia   . Hot flashes   . GERD (gastroesophageal reflux disease)   . Depression   . IBS (irritable bowel syndrome)    Annia Friendly, PT 06/08/19 11:32 AM  [place], [place], [address] Phone: [phone]   Fax:  [phone]  Name: Kathy Barker MRN: [id number] Date of Birth: 05-16-1960

## 2019-06-08 NOTE — Progress Notes (Signed)
START ON PATHWAY REGIMEN - Breast     Cycles 1 through 4: A cycle is every 14 days:     Doxorubicin      Cyclophosphamide      Pegfilgrastim-xxxx    Cycles 5 through 16: A cycle is every 7 days:     Paclitaxel   **Always confirm dose/schedule in your pharmacy ordering system**  Patient Characteristics: Preoperative or Nonsurgical Candidate (Clinical Staging), Neoadjuvant Therapy followed by Surgery, Invasive Disease, Chemotherapy, HER2 Negative/Unknown/Equivocal, ER Negative/Unknown, Platinum Therapy Not Indicated Therapeutic Status: Preoperative or Nonsurgical Candidate (Clinical Staging) AJCC M Category: cM0 AJCC Grade: G3 Breast Surgical Plan: Neoadjuvant Therapy followed by Surgery ER Status: Unknown AJCC 8 Stage Grouping: Unknown HER2 Status: Negative (-) AJCC T Category: cT1c AJCC N Category: cN0 PR Status: Negative (-) Type of Therapy: Platinum Therapy Not Indicated Intent of Therapy: Curative Intent, Discussed with Patient

## 2019-06-08 NOTE — Progress Notes (Signed)
Clinical Social Work [place] Psychosocial Distress Screening [place]   Patient completed distress screening protocol and scored a 2 on the Psychosocial Distress Thermometer which indicates mild distress. Patient did not stay to meet with Clinical Social Worker during clinic today, stating to RN navigator that she was not feeling distressed. CSW will reach out by phone to introduce self and services.     Distress Screen: ONCBCN DISTRESS SCREENING 06/08/2019  Screening Type Initial Screening  Distress experienced in past week (1-10) 2  Practical problem type Work/school  Physical Problem type Breathing     [place]

## 2019-06-08 NOTE — Telephone Encounter (Signed)
Ordered oncotype (core) per Dr. [name]. Faxed requisition to [facility]. 

## 2019-06-08 NOTE — Progress Notes (Signed)
Radiation Oncology         [phone]) [phone] ________________________________  Initial outpatient Consultation  Name: Kathy Barker MRN: [id number]  Date: 06/08/2019  DOB: 18-Apr-1960  DU:KGUR, Nathen May, MD  [name], MD   REFERRING PHYSICIAN: Autumn Messing III, MD  DIAGNOSIS:    ICD-10-CM   1. Malignant neoplasm of lower-inner quadrant of left breast in female, estrogen receptor positive ([place])  C50.312    Z17.0   Cancer Staging Malignant neoplasm of lower-inner quadrant of left breast in female, estrogen receptor positive ([place]) Staging form: Breast, AJCC 8th Edition - Clinical stage from 06/08/2019: Stage IB (cT1c, cN0, cM0, G3, ER+, PR-, HER2-) - Unsigned   CHIEF COMPLAINT: Here to discuss management of left breast cancer  HISTORY OF PRESENT ILLNESS::Kathy Barker is a 59 y.o. female who presented with palpable left breast lump and a feeling similar to engorgement/ductal blockage of her breast while breast-feeding.   Ultrasound of breast on 05/30/19 revealed 1.7 cm lobulated left breast mass at 8 o'clock.  Axilla clinically negative. 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  ER status: 70% (weak); PR status 0%, Her2 status negative; Grade 3.  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  She reports she is fully vaccinated from Covid.  She is otherwise in her usual state of health.  Her mother had DCIS, followed by breast cancer years ago, and is present with her today.  PREVIOUS RADIATION THERAPY: No  PAST MEDICAL HISTORY:  has a past medical history of Allergic rhinitis, Anxiety, Depression, GERD (gastroesophageal reflux disease), High cholesterol, Hot flashes, IBS (irritable bowel syndrome), Insomnia, Migraines, PONV (postoperative nausea and vomiting), and SVT (supraventricular tachycardia) ([place]).    PAST SURGICAL HISTORY: Past Surgical History:  Procedure Laterality Date  . CESAREAN SECTION  1992  . COMBINED HYSTERECTOMY VAGINAL /  OOPHORECTOMY / A&P REPAIR  1999  . 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 SUPRAVENTRICULAR TACHYCARDIA ABLATION N/A 11/18/2012   Procedure: SUPRAVENTRICULAR TACHYCARDIA ABLATION;  Surgeon: Coralyn Mark, MD;  Location: [facility] CATH [facility];  Service: Cardiovascular;  Laterality: N/A;  . VAGINAL HYSTERECTOMY  ~ 2000    FAMILY HISTORY: family history includes Alcohol abuse in her father; Breast cancer in her mother; Heart Problems in an other family member; Heart attack in her father and paternal grandfather.  SOCIAL HISTORY:  reports that she has never smoked. She has never used smokeless tobacco. She reports current alcohol use. She reports that she does not use drugs.  ALLERGIES: Other and Latex  MEDICATIONS:  Current Outpatient Medications  Medication Sig Dispense Refill  . cetirizine (ZYRTEC) 10 MG tablet Take 10 mg by mouth daily.    [name] LIVALO 4 MG TABS Take 1 tablet by mouth daily.    . sertraline (ZOLOFT) 100 MG tablet Take 100 mg by mouth daily.    . SYMBICORT 160-4.5 MCG/ACT inhaler Inhale 2 puffs into the lungs 2 (two) times daily.    . temazepam (RESTORIL) 15 MG capsule Take 15 mg by mouth at bedtime.     No current facility-administered medications for this encounter.    REVIEW OF SYSTEMS: As above    PHYSICAL EXAM:  vitals were not taken for this visit.   General: Alert and oriented, in no acute distress  Psychiatric: Judgment and insight are intact. Affect is appropriate. Breasts: There is a 2 cm mass that is palpable at the 8:00 region  of the left breast. No other palpable masses appreciated in the breasts or axillae bilaterally.    ECOG = 0  0 - Asymptomatic (Fully active, able to carry on all predisease activities without restriction)  1 - Symptomatic but completely ambulatory (Restricted in physically strenuous activity but ambulatory and able to carry out work of a light or sedentary nature. For example,  light housework, office work)  2 - Symptomatic, <50% in bed during the day (Ambulatory and capable of all self care but unable to carry out any work activities. Up and about more than 50% of waking hours)  3 - Symptomatic, >50% in bed, but not bedbound (Capable of only limited self-care, confined to bed or chair 50% or more of waking hours)  4 - Bedbound (Completely disabled. Cannot carry on any self-care. Totally confined to bed or chair)  5 - Death   Eustace Pen MM, Creech RH, Tormey DC, et al. [phone]). "Toxicity and response criteria of the [facility]". [place] Oncol. 5 (6): 649-55   LABORATORY DATA:  Lab Results  Component Value Date   WBC 6.8 06/08/2019   HGB 13.4 06/08/2019   HCT 41.6 06/08/2019   MCV 95.2 06/08/2019   PLT 178 06/08/2019   CMP     Component Value Date/Time   NA 142 06/08/2019 0831   K 4.6 06/08/2019 0831   CL 107 06/08/2019 0831   CO2 27 06/08/2019 0831   GLUCOSE 100 (H) 06/08/2019 0831   BUN 8 06/08/2019 0831   CREATININE 0.79 06/08/2019 0831   CALCIUM 9.3 06/08/2019 0831   PROT 6.7 06/08/2019 0831   ALBUMIN 4.0 06/08/2019 0831   AST 17 06/08/2019 0831   ALT 23 06/08/2019 0831   ALKPHOS 98 06/08/2019 0831   BILITOT 0.3 06/08/2019 0831   GFRNONAA >60 06/08/2019 0831   GFRAA >60 06/08/2019 0831         RADIOGRAPHY:    As above   IMPRESSION/PLAN: Left Breast Cancer   We discussed her this morning in multidisciplinary clinic as a team.  The consensus is that she is a good candidate for breast conserving surgery, chemotherapy, radiotherapy and antiestrogens.  An Oncotype test will be ordered to confirm that chemotherapy is warranted.  It was a pleasure meeting the patient today. We discussed the risks, benefits, and side effects of radiotherapy. I recommend radiotherapy to the left breast to reduce her risk of locoregional recurrence by 2/3.  We discussed that radiation would take approximately 4-6 weeks to complete and that  I would give the patient a few weeks to heal following surgery before starting treatment planning. We spoke about acute effects including skin irritation and fatigue as well as much less common late effects including internal organ injury or irritation. No guarantees of treatment were given. The patient is enthusiastic about proceeding with treatment. I look forward to participating in the patient's care.  I will await her referral back to me for postoperative follow-up and eventual CT simulation/treatment planning.  On date of service, in total, I spent 45 minutes on this encounter.  The patient was seen in person.   __________________________________________   [name], MD   This document serves as a record of services personally performed by [name], MD. It was created on her behalf by Wilburn Mylar, a trained medical scribe. The creation of this record is based on the scribe's personal observations and the provider's statements to them. This document has been checked and approved by the  attending provider.

## 2019-06-08 NOTE — Patient Instructions (Signed)

## 2019-06-09 ENCOUNTER — Other Ambulatory Visit: Payer: Self-pay | Admitting: *Deleted

## 2019-06-09 ENCOUNTER — Telehealth: Payer: Self-pay | Admitting: Oncology

## 2019-06-09 ENCOUNTER — Telehealth: Payer: Self-pay | Admitting: *Deleted

## 2019-06-09 ENCOUNTER — Telehealth: Payer: Self-pay | Admitting: Adult Health

## 2019-06-09 DIAGNOSIS — Z170 Estrogen receptor positive status [ER+]: Secondary | ICD-10-CM

## 2019-06-09 DIAGNOSIS — C50312 Malignant neoplasm of lower-inner quadrant of left female breast: Secondary | ICD-10-CM

## 2019-06-09 MED ORDER — LORAZEPAM 0.5 MG PO TABS: ORAL_TABLET | ORAL | 0 refills | Status: DC

## 2019-06-09 NOTE — Telephone Encounter (Signed)
Made appt changes per 5/27 secure chat msg with charge nurse to move lab and port flush to another day in order to start treatment on 6/8 at 8:30. Left new appt info on voicemail along with my direct number in case pt has any scheduling questions.

## 2019-06-09 NOTE — Telephone Encounter (Signed)
Scheduled appts per 5/26 los. Left voicemail with appt date and time.

## 2019-06-09 NOTE — Telephone Encounter (Signed)
This RN spoke with [facility] per their call stating they cannot fill the lorazepam due to instructions - and use of temazepam nightly.  This RN informed pharmacist lorazepam is medically needed as a support medication due to pt will be undergoing aggressive chemotherapy.  Pharmacist requested new prescription with change in instructions.  This RN will fax over new prescription with appropriate instructions.

## 2019-06-14 ENCOUNTER — Encounter: Payer: Self-pay | Admitting: *Deleted

## 2019-06-14 ENCOUNTER — Telehealth: Payer: Self-pay | Admitting: *Deleted

## 2019-06-14 NOTE — Telephone Encounter (Signed)
Left vm regarding [facility] from 5.26.21. Contact information provided for questions or needs.

## 2019-06-15 ENCOUNTER — Other Ambulatory Visit: Payer: Self-pay | Admitting: *Deleted

## 2019-06-15 ENCOUNTER — Telehealth: Payer: Self-pay | Admitting: *Deleted

## 2019-06-15 DIAGNOSIS — C50312 Malignant neoplasm of lower-inner quadrant of left female breast: Secondary | ICD-10-CM

## 2019-06-15 DIAGNOSIS — Z170 Estrogen receptor positive status [ER+]: Secondary | ICD-10-CM

## 2019-06-15 NOTE — Telephone Encounter (Signed)
Spoke to pt concerning future appts and care plan. Discussed if oncotype score is high, will move forward with neoadjuvant chemo. Confirmed appts with Dr. Jana Hakim and chemo class on 6/15 and port placement on 6/17. Denies further needs or questions at this time.

## 2019-06-16 ENCOUNTER — Encounter: Payer: Self-pay | Admitting: *Deleted

## 2019-06-16 ENCOUNTER — Telehealth: Payer: Self-pay | Admitting: Oncology

## 2019-06-16 ENCOUNTER — Telehealth: Payer: Self-pay | Admitting: *Deleted

## 2019-06-16 NOTE — Telephone Encounter (Signed)
Scheduled appt per 6/2 sch message- unable to reach pt . Left message for patient with apt changes.

## 2019-06-16 NOTE — Telephone Encounter (Signed)
Confirmed echo for 06/28/19 at 11am. Informed pt medications sent to pharmacy are anti-medications for chemotherapy that Dr. Jana Hakim will discuss. Received verbal understanding.

## 2019-06-17 ENCOUNTER — Inpatient Hospital Stay: Payer: 59 | Admitting: Oncology

## 2019-06-17 ENCOUNTER — Inpatient Hospital Stay: Payer: 59

## 2019-06-20 ENCOUNTER — Other Ambulatory Visit: Payer: Self-pay | Admitting: *Deleted

## 2019-06-20 ENCOUNTER — Telehealth: Payer: Self-pay | Admitting: *Deleted

## 2019-06-20 ENCOUNTER — Other Ambulatory Visit: Payer: Self-pay | Admitting: Oncology

## 2019-06-20 DIAGNOSIS — C50312 Malignant neoplasm of lower-inner quadrant of left female breast: Secondary | ICD-10-CM

## 2019-06-20 DIAGNOSIS — Z170 Estrogen receptor positive status [ER+]: Secondary | ICD-10-CM

## 2019-06-20 NOTE — Telephone Encounter (Signed)
Received oncotype score of 51>39% on core bx. Physician team notified.

## 2019-06-21 ENCOUNTER — Inpatient Hospital Stay: Payer: 59

## 2019-06-21 ENCOUNTER — Ambulatory Visit: Payer: Managed Care, Other (non HMO)

## 2019-06-21 ENCOUNTER — Other Ambulatory Visit: Payer: Managed Care, Other (non HMO)

## 2019-06-23 ENCOUNTER — Ambulatory Visit: Payer: Managed Care, Other (non HMO)

## 2019-06-24 ENCOUNTER — Encounter: Payer: Self-pay | Admitting: *Deleted

## 2019-06-24 NOTE — Progress Notes (Signed)
Pharmacist Chemotherapy Monitoring - Initial Assessment    Anticipated start date: 06/30/2019   Regimen:  . Are orders appropriate based on the patient's diagnosis, regimen, and cycle? Yes . Does the plan date match the patient's scheduled date? Yes . Is the sequencing of drugs appropriate? Yes . Are the premedications appropriate for the patient's regimen? Yes . Prior Authorization for treatment is: Pending o If applicable, is the correct biosimilar selected based on the patient's insurance? not applicable  Organ Function and Labs: [name] Are dose adjustments needed based on the patient's renal function, hepatic function, or hematologic function? No . Are appropriate labs ordered prior to the start of patient's treatment? Yes . Other organ system assessment, if indicated: anthracyclines: Echo/ MUGA . The following baseline labs, if indicated, have been ordered: N/A  Dose Assessment: . Are the drug doses appropriate? Yes . Are the following correct: o Drug concentrations Yes o IV fluid compatible with drug Yes o Administration routes Yes o Timing of therapy Yes . If applicable, does the patient have documented access for treatment and/or plans for port-a-cath placement? Yes - scheduled . If applicable, have lifetime cumulative doses been properly documented and assessed? not applicable - no prior chemotherapy Lifetime Dose Tracking  No doses have been documented on this patient for the following tracked chemicals: Doxorubicin, Epirubicin, Idarubicin, Daunorubicin, Mitoxantrone, Bleomycin, Oxaliplatin, Carboplatin, Liposomal Doxorubicin  o   Toxicity Monitoring/Prevention: . The patient has the following take home antiemetics prescribed: Prochlorperazine, Dexamethasone and Lorazepam . The patient has the following take home medications prescribed: CSFs for > 20% risk febrile neutropenia . Medication allergies and previous infusion related reactions, if applicable, have been reviewed and  addressed. Yes . The patient's current medication list has been assessed for drug-drug interactions with their chemotherapy regimen. no significant drug-drug interactions were identified on review.  Order Review: . Are the treatment plan orders signed? No . Is the patient scheduled to see a provider prior to their treatment? Yes  I verify that I have reviewed each item in the above checklist and answered each question accordingly.  Britt Boozer 06/24/2019 11:58 AM

## 2019-06-27 ENCOUNTER — Other Ambulatory Visit: Payer: Self-pay | Admitting: Oncology

## 2019-06-27 DIAGNOSIS — Z170 Estrogen receptor positive status [ER+]: Secondary | ICD-10-CM

## 2019-06-27 DIAGNOSIS — C50312 Malignant neoplasm of lower-inner quadrant of left female breast: Secondary | ICD-10-CM

## 2019-06-27 NOTE — Progress Notes (Signed)
[place]  [phone] Fax:([phone]) [phone]     ID: Kathy Barker DOB: 07/24/1960  MR#: [id number]  YHC#:[id number]  Patient Care Team: [name], MD as PCP - General (Family Medicine) Rockwell Germany, RN as Oncology Nurse Navigator Mauro Kaufmann, RN as Oncology Nurse Navigator [name], MD as Consulting Physician (General Surgery) Aldeen Riga, [name], MD as Consulting Physician (Oncology) [name], MD as Attending Physician (Radiation Oncology) Lavonna Monarch, MD as Consulting Physician (Dermatology) Chauncey Cruel, MD OTHER MD:  CHIEF COMPLAINT: Left-sided breast cancer  CURRENT TREATMENT: Neoadjuvant chemotherapy   INTERVAL HISTORY: Kathy Barker returns today for follow up of her left-sided breast cancer. She was evaluated in the multidisciplinary breast cancer clinic on 06/08/2019.  Since her last visit, Oncotype DX was obtained on the biopsy sample and the recurrence score of 51 predicts a risk of recurrence outside the breast over the next 9 years of 39%, if the patient's only systemic therapy is an antiestrogen for 5 years.  It also predicts a significant benefit from chemotherapy.  She is scheduled for breast MRI on 06/29/2019 and for port placement on 06/30/2019, with chemotherapy to follow the same day   REVIEW OF SYSTEMS: Kathy Barker is going on temporary disability and today was her last day of work.  She is having some headaches, but she says that they are no different from the headaches that she regularly has.  A detailed review of systems today was otherwise stable.    HISTORY OF CURRENT ILLNESS: From the original intake note:  Kathy Barker herself noted a palpable lower-inner left breast lump and immediately brought it to medical attention.. She underwent bilateral diagnostic mammography with tomography and left breast ultrasonography at [facility] on 05/30/2019 showing: breast density category B; 1.7 cm lobulated mass in left breast at 8  o'clock; no significant left axillary abnormalities.  Accordingly on 06/01/2019 she proceeded to biopsy of the left breast area in question. The pathology from this procedure ([id number]) showed: invasive ductal carcinoma, grade 3. Prognostic indicators significant for: estrogen receptor, 70% positive with weak staining intensity and progesterone receptor, 0% negative. Proliferation marker Ki67 at 80%. HER2 negative by immunohistochemistry (1+).  The patient's subsequent history is as detailed below.   PAST MEDICAL HISTORY: Past Medical History:  Diagnosis Date  . Allergic rhinitis   . Anxiety    "only during my divorce" (11/18/2012)  . Depression    "only during my divorce" (11/18/2012)  . GERD (gastroesophageal reflux disease)   . High cholesterol   . Hot flashes   . IBS (irritable bowel syndrome)   . Insomnia   . Migraines    "lately more often; before SVT I'd get them ~ q 6 months" (11/18/2012)  . PONV (postoperative nausea and vomiting)   . 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 CESAREAN SECTION  1992  . COMBINED HYSTERECTOMY VAGINAL / OOPHORECTOMY / A&P REPAIR  1999  . 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 SUPRAVENTRICULAR TACHYCARDIA ABLATION N/A 11/18/2012   Procedure: SUPRAVENTRICULAR TACHYCARDIA ABLATION;  Surgeon: Coralyn Mark, MD;  Location: [facility]ATH [facility];  Service: Cardiovascular;  Laterality: N/A;  . VAGINAL HYSTERECTOMY  ~ 2000    FAMILY HISTORY: Family History  Problem Relation Age of Onset  . Heart attack Father   .  Alcohol abuse Father   . Heart attack Paternal Grandfather   . Heart Problems Other        both sides of family  . Breast cancer Mother    Her father died at age 54 from alcohol abuse. Her mother is living at age 38 (as of 05/2019) and has a  history of DCIS at age 38 and invasive lobular breast cancer at age 56. Astin has one brother. She reports cancer of an unknown type in her maternal grandmother and prostate cancer in a maternal uncle.   GYNECOLOGIC HISTORY:  No LMP recorded. Patient has had a hysterectomy. Menarche: 59 years old Age at first live birth: 59 years old Twining P 2 LMP 2000 Contraceptive: never used HRT never used  Hysterectomy? yes BSO? 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  She will be on temporary disability during her chemotherapy.  She is divorced. She lives at home with son Kathy Barker, age 75, who works in Biomedical scientist in [place]. Son Kathy Barker, age 73, works in Sales executive here in [place]. 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     ADVANCED DIRECTIVES: Not in place. She intends to name both of her sons as her 49.   HEALTH MAINTENANCE: Social History   Tobacco Use  . Smoking status: Never Smoker  . Smokeless tobacco: Never Used  Vaping Use  . Vaping Use: Never used  Substance Use Topics  . Alcohol use: Yes    Comment: 11/18/2012 "shot of brandy couple times/month"  . Drug use: No     Colonoscopy: yes, date unsure  PAP: date unsure  Bone density: never done   Allergies  Allergen Reactions  . Other     Problems with stitches  . Latex Rash    Current Outpatient Medications  Medication Sig Dispense Refill  . cetirizine (ZYRTEC) 10 MG tablet Take 10 mg by mouth daily.    [name] dexamethasone (DECADRON) 4 MG tablet Take 2 tablets by mouth twice a day starting on the day after chemotherapy (day 2), repeat the next day (day 3) then take 2 tablets in AM only day 4, then stop.  Take with food. 30 tablet 1  . lidocaine-prilocaine (EMLA) cream Apply to affected area once 30 g 3  . LIVALO 4 MG TABS Take 1 tablet by mouth daily.    [name] loratadine (CLARITIN) 10 MG tablet Take 1 tablet (10 mg total) by mouth daily. 60 tablet 3   . LORazepam (ATIVAN) 0.5 MG tablet Use 1 tab daily as needed for anxiety and or nausea. 30 tablet 0  . prochlorperazine (COMPAZINE) 10 MG tablet Take 1 tablet (10 mg total) by mouth every 6 (six) hours as needed (Nausea or vomiting). 30 tablet 1  . sertraline (ZOLOFT) 100 MG tablet Take 100 mg by mouth daily.    . SYMBICORT 160-4.5 MCG/ACT inhaler Inhale 2 puffs into the lungs 2 (two) times daily.    . temazepam (RESTORIL) 15 MG capsule Take 15 mg by mouth at bedtime.     No current facility-administered medications for this visit.    OBJECTIVE: White woman who appears stated age  35:   06/28/19 1256  BP: (!) 145/67  Pulse: 74  Resp: 17  Temp: 98.5 F (36.9 C)  SpO2: 100%     Body mass index is 23.11 kg/m.   Wt Readings from Last 3 Encounters:  06/28/19 128 lb 6.4 oz (58.2 kg)  06/08/19 129 lb 9.6 oz (58.8 kg)  11/09/17 120 lb (54.4 kg)      ECOG FS:1 - Symptomatic but completely ambulatory  Sclerae unicteric, EOMs intact Wearing a mask No cervical or supraclavicular adenopathy Lungs no rales or rhonchi Heart regular rate and rhythm Abd soft, nontender, positive bowel sounds MSK no focal spinal tenderness, no upper extremity lymphedema Neuro: nonfocal, well oriented, appropriate affect Breasts: The mass in the left breast lower inner quadrant is most easily palpable pushing up into the breast from below, where a clear edge is easily palpable, not associated with erythema or nipple changes.   LAB RESULTS:  CMP     Component Value Date/Time   NA 144 06/28/2019 1233   K 4.3 06/28/2019 1233   CL 106 06/28/2019 1233   CO2 28 06/28/2019 1233   GLUCOSE 90 06/28/2019 1233   BUN 7 06/28/2019 1233   CREATININE 0.79 06/28/2019 1233   CREATININE 0.79 06/08/2019 0831   CALCIUM 9.8 06/28/2019 1233   PROT 7.0 06/28/2019 1233   ALBUMIN 4.3 06/28/2019 1233   AST 21 06/28/2019 1233   AST 17 06/08/2019 0831   ALT 31 06/28/2019 1233   ALT 23 06/08/2019 0831   ALKPHOS 101  06/28/2019 1233   BILITOT 0.4 06/28/2019 1233   BILITOT 0.3 06/08/2019 0831   GFRNONAA >60 06/28/2019 1233   GFRNONAA >60 06/08/2019 0831   GFRAA >60 06/28/2019 1233   GFRAA >60 06/08/2019 0831    No results found for: TOTALPROTELP, ALBUMINELP, A1GS, A2GS, BETS, BETA2SER, GAMS, MSPIKE, SPEI  Lab Results  Component Value Date   WBC 6.2 06/28/2019   NEUTROABS 3.9 06/28/2019   HGB 14.0 06/28/2019   HCT 41.9 06/28/2019   MCV 95.0 06/28/2019   PLT 206 06/28/2019    No results found for: LABCA2  No components found for: MCPBWB802  No results for input(s): INR in the last 168 hours.  No results found for: LABCA2  No results found for: SDP607  No results found for: QMJ883  No results found for: WPF887  No results found for: CA2729  No components found for: HGQUANT  No results found for: CEA1 / No results found for: CEA1   No results found for: AFPTUMOR  No results found for: CHROMOGRNA  No results found for: KPAFRELGTCHN, LAMBDASER, KAPLAMBRATIO (kappa/lambda light chains)  No results found for: HGBA, HGBA2QUANT, HGBFQUANT, HGBSQUAN (Hemoglobinopathy evaluation)   No results found for: LDH  No results found for: IRON, TIBC, IRONPCTSAT (Iron and TIBC)  No results found for: FERRITIN  Urinalysis No results found for: COLORURINE, APPEARANCEUR, LABSPEC, PHURINE, GLUCOSEU, HGBUR, BILIRUBINUR, KETONESUR, PROTEINUR, UROBILINOGEN, NITRITE, LEUKOCYTESUR   STUDIES: ECHOCARDIOGRAM COMPLETE  Result Date: 06/28/2019    ECHOCARDIOGRAM REPORT   Patient Name:   KIMBERLLY NORGARD Date of Exam: 06/28/2019 Medical Rec #:  [id number]     Height:       62.5 in Accession #:    [id number]    Weight:       129.6 lb Date of Birth:  09-Nov-1960      BSA:          1.599 m Patient Age:    59 years      BP:           146/80 mmHg Patient Gender: F             HR:           65 bpm. Exam Location:  Outpatient Procedure: 2D Echo, 3D Echo,  Cardiac Doppler, Color Doppler and Strain Analysis  Indications:    Z51.11 Encounter for antineoplastic chemotheraphy  History:        Patient has prior history of Echocardiogram examinations, most                 recent 10/07/2012. Risk Factors:Dyslipidemia and GERD.  Sonographer:    Jonelle Sidle Dance Referring Phys: [place]  1. Left ventricular ejection fraction, by estimation, is 60 to 65%. The left ventricle has normal function. The left ventricle has no regional wall motion abnormalities. Left ventricular diastolic parameters are indeterminate. The average left ventricular global longitudinal strain is -16.5 %. The global longitudinal strain is normal.  2. Right ventricular systolic function is normal. The right ventricular size is normal. There is normal pulmonary artery systolic pressure.  3. The mitral valve is normal in structure. Mild mitral valve regurgitation. No evidence of mitral stenosis.  4. The aortic valve is normal in structure. Aortic valve regurgitation is not visualized. No aortic stenosis is present.  5. The inferior vena cava is normal in size with greater than 50% respiratory variability, suggesting right atrial pressure of 3 mmHg.  6. Compared to echo 2014, no significant change. FINDINGS  Left Ventricle: Left ventricular ejection fraction, by estimation, is 60 to 65%. The left ventricle has normal function. The left ventricle has no regional wall motion abnormalities. The average left ventricular global longitudinal strain is -16.5 %. The global longitudinal strain is normal. The left ventricular internal cavity size was normal in size. There is no left ventricular hypertrophy. Left ventricular diastolic parameters are indeterminate. Normal left ventricular filling pressure. Right Ventricle: The right ventricular size is normal. No increase in right ventricular wall thickness. Right ventricular systolic function is normal. There is normal pulmonary artery systolic pressure. The tricuspid regurgitant velocity is 2.12  m/s, and  with an assumed right atrial pressure of 3 mmHg, the estimated right ventricular systolic pressure is 99.3 mmHg. Left Atrium: Left atrial size was normal in size. Right Atrium: Right atrial size was normal in size. Pericardium: There is no evidence of pericardial effusion. Mitral Valve: The mitral valve is normal in structure. Normal mobility of the mitral valve leaflets. Mild mitral valve regurgitation. No evidence of mitral valve stenosis. Tricuspid Valve: The tricuspid valve is normal in structure. Tricuspid valve regurgitation is not demonstrated. No evidence of tricuspid stenosis. Aortic Valve: The aortic valve is normal in structure. Aortic valve regurgitation is not visualized. No aortic stenosis is present. Pulmonic Valve: The pulmonic valve was normal in structure. Pulmonic valve regurgitation is not visualized. No evidence of pulmonic stenosis. Aorta: The aortic root is normal in size and structure. Venous: The inferior vena cava is normal in size with greater than 50% respiratory variability, suggesting right atrial pressure of 3 mmHg. IAS/Shunts: The interatrial septum appears to be lipomatous. No atrial level shunt detected by color flow Doppler.  LEFT VENTRICLE PLAX 2D LVIDd:         3.60 cm  Diastology LVIDs:         2.50 cm  LV e' lateral:   9.23 cm/s LV PW:         1.20 cm  LV E/e' lateral: 10.1 LV IVS:        1.00 cm  LV e' medial:    6.74 cm/s LVOT diam:     1.50 cm  LV E/e' medial:  13.8 LV SV:         33 LV SV  Index:   21       2D Longitudinal Strain LVOT Area:     1.77 cm 2D Strain GLS (A2C):   -17.2 %                         2D Strain GLS (A3C):   -13.8 %                         2D Strain GLS (A4C):   -18.5 %                         2D Strain GLS Avg:     -16.5 %                          3D Volume EF:                         3D EF:        59 %                         LV EDV:       106 ml                         LV ESV:       43 ml                         LV SV:        63 ml RIGHT  VENTRICLE             IVC RV Basal diam:  2.10 cm     IVC diam: 1.30 cm RV S prime:     10.10 cm/s TAPSE (M-mode): 1.7 cm LEFT ATRIUM             Index       RIGHT ATRIUM          Index LA diam:        3.30 cm 2.06 cm/m  RA Area:     8.21 cm LA Vol (A2C):   32.2 ml 20.13 ml/m RA Volume:   15.00 ml 9.38 ml/m LA Vol (A4C):   30.7 ml 19.20 ml/m LA Biplane Vol: 32.3 ml 20.20 ml/m  AORTIC VALVE LVOT Vmax:   82.50 cm/s LVOT Vmean:  54.100 cm/s LVOT VTI:    0.188 m  AORTA Ao Root diam: 2.70 cm Ao Asc diam:  2.80 cm MITRAL VALVE               TRICUSPID VALVE MV Area (PHT): 4.06 cm    TR Peak grad:   18.0 mmHg MV Decel Time: 187 msec    TR Vmax:        212.00 cm/s MV E velocity: 93.40 cm/s MV A velocity: 65.60 cm/s  SHUNTS MV E/A ratio:  1.42        Systemic VTI:  0.19 m                            Systemic Diam: 1.50 cm Fransico Him MD Electronically signed by Fransico Him MD Signature Date/Time: 06/28/2019/1:25:36 PM    Final      ELIGIBLE FOR AVAILABLE RESEARCH PROTOCOL: no  ASSESSMENT:  59 y.o. [place] woman status post left breast lower inner quadrant biopsy 06/01/2019 for a clinical T1c N0, stage IB invasive ductal carcinoma, grade 3, weakly estrogen receptor positive but functionally triple negative, with an MIB-1 of 80%.  (1) Oncotype score of 51 predicts a risk of recurrence outside the breast in the next 9 years of greater than 39% if the patient's only systemic therapy is antiestrogens for 5 years.  It also predicts a greater than 15% benefit from chemotherapy  (2) neoadjuvant chemotherapy will consist of doxorubicin and cyclophosphamide in dose dense fashion x4 beginning 06/30/2019, to be followed by weekly paclitaxel and carboplatin weekly x12  (3) definitive surgery pending  (4) adjuvant radiation to follow: Consider capecitabine sensitization  PLAN: We reviewed her Oncotype and she understands with chemo and antiestrogens she will have a 75% chance of cure, defined as no recurrence  outside the breast in the next 9 years.  We can improve on those odds first by giving her dose dense treatment, which shaves a couple of percentages from the risk, and also by continuing the antiestrogens for 10 years which adds another 2 to 3% so we can get her to 80% with those very small changes in her treatment plan.  Whether we can do better than that will depend partly on her response to chemotherapy.  If she does have a complete pathologic response we frequently quoted a less than 10% risk of recurrence from that point  We also discussed the possible toxicities side effects and complications of treatment.  She also will meet with our chemotherapy teaching nurse to get more details and a routing map on how to take her supportive medications.  She does not have a definitive plan for the hair loss but she knows she has 2 or 2-1/2weeks from the first cycle before she will lose her hair.  She has discussed her situation with both her sons.  For her younger son who actually lives with her during the week it would be better if she were treated on Tuesdays so after the first treatment this Thursday were going to move her treatments to Tuesday beginning July 6.  They will be every 2 weeks thereafter.   I have scheduled her to see [place] again a week after treatment and to bring [place] a diary of symptoms so we can troubleshoot problems and make her remaining cycles easier.  I have encouraged her to call us with any questions or concerns.  Total encounter time today 35 minutes.Sarajane Jews C. Ambrosio Reuter, MD 06/28/2019 6:01 PM Medical Oncology and Hematology [facility] [place],  [address] Tel. [phone]    Fax. [phone]   This document serves as a record of services personally performed by Lurline Del, MD. It was created on his behalf by Wilburn Mylar, a trained medical scribe. The creation of this record is based on the scribe's personal observations and the  provider's statements to them.   I, Lurline Del MD, have reviewed the above documentation for accuracy and completeness, and I agree with the above.   *Total Encounter Time as defined by the Centers for Medicare and Medicaid Services includes, in addition to the face-to-face time of a patient visit (documented in the note above) non-face-to-face time: obtaining and reviewing outside history, ordering and reviewing medications, tests or procedures, care coordination (communications with other health care professionals or caregivers) and documentation in the medical record.

## 2019-06-28 ENCOUNTER — Other Ambulatory Visit: Payer: Self-pay

## 2019-06-28 ENCOUNTER — Ambulatory Visit (HOSPITAL_COMMUNITY)
Admission: RE | Admit: 2019-06-28 | Discharge: 2019-06-28 | Disposition: A | Discharge status: Home / Self Care | Payer: 59 | Source: Ambulatory Visit | Attending: Oncology | Admitting: Oncology

## 2019-06-28 ENCOUNTER — Inpatient Hospital Stay: Payer: 59 | Attending: Oncology | Admitting: Oncology

## 2019-06-28 ENCOUNTER — Other Ambulatory Visit: Payer: Self-pay | Admitting: Radiology

## 2019-06-28 ENCOUNTER — Inpatient Hospital Stay: Payer: 59

## 2019-06-28 ENCOUNTER — Telehealth: Payer: Self-pay | Admitting: *Deleted

## 2019-06-28 VITALS — BP 145/67 | HR 74 | Temp 98.5°F | Resp 17 | Ht 62.5 in | Wt 128.4 lb

## 2019-06-28 DIAGNOSIS — Z170 Estrogen receptor positive status [ER+]: Secondary | ICD-10-CM | POA: Insufficient documentation

## 2019-06-28 DIAGNOSIS — C50312 Malignant neoplasm of lower-inner quadrant of left female breast: Secondary | ICD-10-CM

## 2019-06-28 DIAGNOSIS — Z0189 Encounter for other specified special examinations: Secondary | ICD-10-CM | POA: Diagnosis not present

## 2019-06-28 DIAGNOSIS — Z90721 Acquired absence of ovaries, unilateral: Secondary | ICD-10-CM | POA: Insufficient documentation

## 2019-06-28 DIAGNOSIS — I340 Nonrheumatic mitral (valve) insufficiency: Secondary | ICD-10-CM | POA: Insufficient documentation

## 2019-06-28 DIAGNOSIS — K219 Gastro-esophageal reflux disease without esophagitis: Secondary | ICD-10-CM | POA: Insufficient documentation

## 2019-06-28 DIAGNOSIS — Z8249 Family history of ischemic heart disease and other diseases of the circulatory system: Secondary | ICD-10-CM | POA: Insufficient documentation

## 2019-06-28 DIAGNOSIS — Z5189 Encounter for other specified aftercare: Secondary | ICD-10-CM | POA: Insufficient documentation

## 2019-06-28 DIAGNOSIS — Z811 Family history of alcohol abuse and dependence: Secondary | ICD-10-CM | POA: Insufficient documentation

## 2019-06-28 DIAGNOSIS — Z803 Family history of malignant neoplasm of breast: Secondary | ICD-10-CM | POA: Insufficient documentation

## 2019-06-28 DIAGNOSIS — E785 Hyperlipidemia, unspecified: Secondary | ICD-10-CM | POA: Diagnosis not present

## 2019-06-28 DIAGNOSIS — Z7952 Long term (current) use of systemic steroids: Secondary | ICD-10-CM | POA: Insufficient documentation

## 2019-06-28 DIAGNOSIS — R110 Nausea: Secondary | ICD-10-CM | POA: Insufficient documentation

## 2019-06-28 DIAGNOSIS — Z885 Allergy status to narcotic agent status: Secondary | ICD-10-CM | POA: Insufficient documentation

## 2019-06-28 DIAGNOSIS — R5383 Other fatigue: Secondary | ICD-10-CM | POA: Insufficient documentation

## 2019-06-28 DIAGNOSIS — Z79899 Other long term (current) drug therapy: Secondary | ICD-10-CM | POA: Insufficient documentation

## 2019-06-28 LAB — CBC WITH DIFFERENTIAL/PLATELET
Abs Immature Granulocytes: 0.01 10*3/uL (ref 0.00–0.07)
Basophils Absolute: 0 10*3/uL (ref 0.0–0.1)
Basophils Relative: 0 %
Eosinophils Absolute: 0.1 10*3/uL (ref 0.0–0.5)
Eosinophils Relative: 1 %
HCT: 41.9 % (ref 36.0–46.0)
Hemoglobin: 14 g/dL (ref 12.0–15.0)
Immature Granulocytes: 0 %
Lymphocytes Relative: 29 %
Lymphs Abs: 1.8 10*3/uL (ref 0.7–4.0)
MCH: 31.7 pg (ref 26.0–34.0)
MCHC: 33.4 g/dL (ref 30.0–36.0)
MCV: 95 fL (ref 80.0–100.0)
Monocytes Absolute: 0.4 10*3/uL (ref 0.1–1.0)
Monocytes Relative: 6 %
Neutro Abs: 3.9 10*3/uL (ref 1.7–7.7)
Neutrophils Relative %: 64 %
Platelets: 206 10*3/uL (ref 150–400)
RBC: 4.41 MIL/uL (ref 3.87–5.11)
RDW: 12.5 % (ref 11.5–15.5)
WBC: 6.2 10*3/uL (ref 4.0–10.5)
nRBC: 0 % (ref 0.0–0.2)

## 2019-06-28 LAB — COMPREHENSIVE METABOLIC PANEL
ALT: 31 U/L (ref 0–44)
AST: 21 U/L (ref 15–41)
Albumin: 4.3 g/dL (ref 3.5–5.0)
Alkaline Phosphatase: 101 U/L (ref 38–126)
Anion gap: 10 (ref 5–15)
BUN: 7 mg/dL (ref 6–20)
CO2: 28 mmol/L (ref 22–32)
Calcium: 9.8 mg/dL (ref 8.9–10.3)
Chloride: 106 mmol/L (ref 98–111)
Creatinine, Ser: 0.79 mg/dL (ref 0.44–1.00)
GFR calc Af Amer: >60 mL/min (ref >60)
GFR calc non Af Amer: >60 mL/min (ref >60)
Glucose, Bld: 90 mg/dL (ref 70–99)
Potassium: 4.3 mmol/L (ref 3.5–5.1)
Sodium: 144 mmol/L (ref 135–145)
Total Bilirubin: 0.4 mg/dL (ref 0.3–1.2)
Total Protein: 7 g/dL (ref 6.5–8.1)

## 2019-06-28 NOTE — Telephone Encounter (Signed)
This RN reviewed with pt education regarding treatment plan.  Reviewed chemotherapy drugs Adriamycin and Cytoxan and taxol including how administered , side effects including acute and late onset. Printed data given. Of note pt is aware of hair loss - Cold Cap discussed briefly with pt declining use. Wig/hat brochure and magazine given.  Reviewed support medications neupogen ( or it's like ), emla cream, lorazepam, claritin and prochlorperazine and decadron. Written instructions given as to when to use these medications in accordance with her treatment regimen.  Of note this RN advised pt on use of lorazepam for chemo side effects and her long use of temazepam and verbalized on to use at same time.  Reviewed with pt how to contact the office -during open office hours and after hours.  Reviewed use of " chemo card " for ER use and card given to patient. Reviewed HCPOA and Living will with packet given to pt. Reviewed and written information given regarding support services. Printed data given to pt. Reviewed food and hydration recommendations. Cancer diet book given. Reviewed intercourse/sex issues though pt states she is not currently in a relationship.  Patient was taken to treatment room and reviewed process of checking in and where to wait as well as presently no visitor policy in treatment room at this time due to the Covid 19 pandemic.  Post discussion including allowing pt to ask questions- pt will keep a daily diary so it can be reviewed with MD at interval visits.  The only concern the patient has is " my mom really wants to accompany me while I am getting treated and can you tell her she cannot come with me "  She also inquired about changing her treatment day from Thursdays to Tuesdays to allow her son who lives in same home " to go away for the weekend " - this RN will send request post MD review.  Per end of education meeting pt has no further questions or needs at this time.  Patient was given all handouts/brochures per above discussion.

## 2019-06-28 NOTE — Progress Notes (Signed)
  Echocardiogram 2D Echocardiogram has been performed.  Kathy Barker 06/28/2019, 12:51 PM

## 2019-06-29 ENCOUNTER — Other Ambulatory Visit: Payer: Self-pay | Admitting: Physician Assistant

## 2019-06-29 ENCOUNTER — Telehealth: Payer: Self-pay | Admitting: Oncology

## 2019-06-29 ENCOUNTER — Ambulatory Visit
Admission: RE | Admit: 2019-06-29 | Discharge: 2019-06-29 | Disposition: A | Discharge status: Home / Self Care | Payer: 59 | Source: Ambulatory Visit | Attending: Oncology | Admitting: Oncology

## 2019-06-29 DIAGNOSIS — Z170 Estrogen receptor positive status [ER+]: Secondary | ICD-10-CM

## 2019-06-29 DIAGNOSIS — C50312 Malignant neoplasm of lower-inner quadrant of left female breast: Secondary | ICD-10-CM

## 2019-06-29 MED ORDER — GADOBUTROL 1 MMOL/ML IV SOLN
6.0000 mL | Freq: Once | INTRAVENOUS | Status: AC | PRN
  Administered 2019-06-29: 6 mL via INTRAVENOUS

## 2019-06-29 NOTE — Telephone Encounter (Signed)
Cancelled appts and scheduled appts per 6/15 los. Left voicemail with cancellation details and new appt details.

## 2019-06-30 ENCOUNTER — Ambulatory Visit (HOSPITAL_COMMUNITY)
Admission: RE | Admit: 2019-06-30 | Discharge: 2019-06-30 | Disposition: A | Discharge status: Home / Self Care | Payer: 59 | Source: Ambulatory Visit | Attending: Oncology | Admitting: Oncology

## 2019-06-30 ENCOUNTER — Other Ambulatory Visit: Payer: Self-pay

## 2019-06-30 ENCOUNTER — Encounter: Payer: Self-pay | Admitting: *Deleted

## 2019-06-30 ENCOUNTER — Inpatient Hospital Stay: Payer: 59

## 2019-06-30 ENCOUNTER — Other Ambulatory Visit: Payer: 59

## 2019-06-30 ENCOUNTER — Encounter (HOSPITAL_COMMUNITY): Payer: Self-pay

## 2019-06-30 VITALS — BP 138/70 | HR 70 | Temp 98.4°F | Resp 16

## 2019-06-30 DIAGNOSIS — Z95828 Presence of other vascular implants and grafts: Secondary | ICD-10-CM

## 2019-06-30 DIAGNOSIS — C50312 Malignant neoplasm of lower-inner quadrant of left female breast: Secondary | ICD-10-CM

## 2019-06-30 DIAGNOSIS — K219 Gastro-esophageal reflux disease without esophagitis: Secondary | ICD-10-CM | POA: Insufficient documentation

## 2019-06-30 DIAGNOSIS — Z170 Estrogen receptor positive status [ER+]: Secondary | ICD-10-CM | POA: Diagnosis not present

## 2019-06-30 DIAGNOSIS — I471 Supraventricular tachycardia: Secondary | ICD-10-CM | POA: Diagnosis not present

## 2019-06-30 DIAGNOSIS — E7800 Pure hypercholesterolemia, unspecified: Secondary | ICD-10-CM | POA: Diagnosis not present

## 2019-06-30 DIAGNOSIS — K589 Irritable bowel syndrome without diarrhea: Secondary | ICD-10-CM | POA: Diagnosis not present

## 2019-06-30 DIAGNOSIS — Z79899 Other long term (current) drug therapy: Secondary | ICD-10-CM | POA: Diagnosis not present

## 2019-06-30 HISTORY — DX: Malignant (primary) neoplasm, unspecified: C80.1

## 2019-06-30 HISTORY — PX: IR IMAGING GUIDED PORT INSERTION: IMG5740

## 2019-06-30 LAB — CBC WITH DIFFERENTIAL/PLATELET
Abs Immature Granulocytes: 0.02 10*3/uL (ref 0.00–0.07)
Basophils Absolute: 0 10*3/uL (ref 0.0–0.1)
Basophils Relative: 1 %
Eosinophils Absolute: 0.1 10*3/uL (ref 0.0–0.5)
Eosinophils Relative: 1 %
HCT: 41.9 % (ref 36.0–46.0)
Hemoglobin: 13.9 g/dL (ref 12.0–15.0)
Immature Granulocytes: 0 %
Lymphocytes Relative: 35 %
Lymphs Abs: 2.2 10*3/uL (ref 0.7–4.0)
MCH: 31.6 pg (ref 26.0–34.0)
MCHC: 33.2 g/dL (ref 30.0–36.0)
MCV: 95.2 fL (ref 80.0–100.0)
Monocytes Absolute: 0.4 10*3/uL (ref 0.1–1.0)
Monocytes Relative: 7 %
Neutro Abs: 3.6 10*3/uL (ref 1.7–7.7)
Neutrophils Relative %: 56 %
Platelets: 201 10*3/uL (ref 150–400)
RBC: 4.4 MIL/uL (ref 3.87–5.11)
RDW: 12.7 % (ref 11.5–15.5)
WBC: 6.3 10*3/uL (ref 4.0–10.5)
nRBC: 0 % (ref 0.0–0.2)

## 2019-06-30 LAB — PROTIME-INR
INR: 0.9 (ref 0.8–1.2)
Prothrombin Time: 11.6 seconds (ref 11.4–15.2)

## 2019-06-30 MED ORDER — SODIUM CHLORIDE 0.9% FLUSH
10.0000 mL | Freq: Once | INTRAVENOUS | Status: AC
  Administered 2019-06-30: 10 mL
  Filled 2019-06-30: qty 10

## 2019-06-30 MED ORDER — PALONOSETRON HCL INJECTION 0.25 MG/5ML
INTRAVENOUS | Status: AC
  Filled 2019-06-30: qty 5

## 2019-06-30 MED ORDER — ONDANSETRON HCL 4 MG/2ML IJ SOLN
INTRAMUSCULAR | Status: AC
  Filled 2019-06-30: qty 2

## 2019-06-30 MED ORDER — PALONOSETRON HCL INJECTION 0.25 MG/5ML
0.2500 mg | Freq: Once | INTRAVENOUS | Status: AC
  Administered 2019-06-30: 0.25 mg via INTRAVENOUS

## 2019-06-30 MED ORDER — CEFAZOLIN SODIUM-DEXTROSE 2-4 GM/100ML-% IV SOLN
INTRAVENOUS | Status: AC
  Administered 2019-06-30: 2 g via INTRAVENOUS
  Filled 2019-06-30: qty 100

## 2019-06-30 MED ORDER — DIPHENHYDRAMINE HCL 50 MG/ML IJ SOLN
INTRAMUSCULAR | Status: AC
  Filled 2019-06-30: qty 1

## 2019-06-30 MED ORDER — ONDANSETRON HCL 4 MG/2ML IJ SOLN
4.0000 mg | Freq: Once | INTRAMUSCULAR | Status: AC
  Administered 2019-06-30: 4 mg via INTRAVENOUS
  Filled 2019-06-30: qty 2

## 2019-06-30 MED ORDER — HEPARIN SOD (PORK) LOCK FLUSH 100 UNIT/ML IV SOLN
500.0000 [IU] | Freq: Once | INTRAVENOUS | Status: AC | PRN
  Administered 2019-06-30: 500 [IU]
  Filled 2019-06-30: qty 5

## 2019-06-30 MED ORDER — SODIUM CHLORIDE 0.9 % IV SOLN
Freq: Once | INTRAVENOUS | Status: AC
  Filled 2019-06-30: qty 250

## 2019-06-30 MED ORDER — SODIUM CHLORIDE 0.9 % IV SOLN
600.0000 mg/m2 | Freq: Once | INTRAVENOUS | Status: AC
  Administered 2019-06-30: 960 mg via INTRAVENOUS
  Filled 2019-06-30: qty 48

## 2019-06-30 MED ORDER — HYDROMORPHONE HCL 1 MG/ML IJ SOLN
INTRAMUSCULAR | Status: AC
  Filled 2019-06-30: qty 1

## 2019-06-30 MED ORDER — LIDOCAINE HCL (PF) 1 % IJ SOLN
INTRAMUSCULAR | Status: AC | PRN
  Administered 2019-06-30 (×2): 10 mL via INTRADERMAL

## 2019-06-30 MED ORDER — LIDOCAINE HCL 1 % IJ SOLN
INTRAMUSCULAR | Status: AC
  Filled 2019-06-30: qty 20

## 2019-06-30 MED ORDER — DIPHENHYDRAMINE HCL 50 MG/ML IJ SOLN
INTRAMUSCULAR | Status: AC | PRN
  Administered 2019-06-30: 25 mg via INTRAVENOUS

## 2019-06-30 MED ORDER — SODIUM CHLORIDE 0.9 % IV SOLN: INTRAVENOUS | Status: DC

## 2019-06-30 MED ORDER — CEFAZOLIN SODIUM-DEXTROSE 2-4 GM/100ML-% IV SOLN: 2.0000 g | INTRAVENOUS | Status: AC

## 2019-06-30 MED ORDER — SODIUM CHLORIDE 0.9 % IV SOLN
150.0000 mg | Freq: Once | INTRAVENOUS | Status: AC
  Administered 2019-06-30: 150 mg via INTRAVENOUS
  Filled 2019-06-30: qty 150

## 2019-06-30 MED ORDER — SODIUM CHLORIDE 0.9 % IV SOLN
10.0000 mg | Freq: Once | INTRAVENOUS | Status: AC
  Administered 2019-06-30: 10 mg via INTRAVENOUS
  Filled 2019-06-30: qty 10

## 2019-06-30 MED ORDER — DOXORUBICIN HCL CHEMO IV INJECTION 2 MG/ML
60.0000 mg/m2 | Freq: Once | INTRAVENOUS | Status: AC
  Administered 2019-06-30: 96 mg via INTRAVENOUS
  Filled 2019-06-30: qty 48

## 2019-06-30 MED ORDER — SODIUM CHLORIDE 0.9% FLUSH
10.0000 mL | INTRAVENOUS | Status: DC | PRN
  Administered 2019-06-30: 10 mL
  Filled 2019-06-30: qty 10

## 2019-06-30 MED ORDER — HYDROMORPHONE HCL 1 MG/ML IJ SOLN
INTRAMUSCULAR | Status: AC | PRN
  Administered 2019-06-30 (×2): 0.5 mg via INTRAVENOUS

## 2019-06-30 NOTE — Sedation Documentation (Signed)
Patient is resting comfortably with eyes closed. Responds when spoken to. In NAD.

## 2019-06-30 NOTE — Patient Instructions (Signed)
[facility] Discharge Instructions for Patients Receiving Chemotherapy  Today you received the following chemotherapy agents: doxorubicin and cyclophosphamide.  To help prevent nausea and vomiting after your treatment, we encourage you to take your nausea medication as directed.   If you develop nausea and vomiting that is not controlled by your nausea medication, call the clinic.   BELOW ARE SYMPTOMS THAT SHOULD BE REPORTED IMMEDIATELY:  *FEVER GREATER THAN 100.5 F  *CHILLS WITH OR WITHOUT FEVER  NAUSEA AND VOMITING THAT IS NOT CONTROLLED WITH YOUR NAUSEA MEDICATION  *UNUSUAL SHORTNESS OF BREATH  *UNUSUAL BRUISING OR BLEEDING  TENDERNESS IN MOUTH AND THROAT WITH OR WITHOUT PRESENCE OF ULCERS  *URINARY PROBLEMS  *BOWEL PROBLEMS  UNUSUAL RASH Items with * indicate a potential emergency and should be followed up as soon as possible.  Feel free to call the clinic should you have any questions or concerns. The clinic phone number is [phone].  Please show the [place] at check-in to the Emergency Department and triage nurse.  Doxorubicin injection What is this medicine? DOXORUBICIN (dox oh ROO bi sin) is a chemotherapy drug. It is used to treat many kinds of cancer like leukemia, lymphoma, neuroblastoma, sarcoma, and Wilms' tumor. It is also used to treat bladder cancer, breast cancer, lung cancer, ovarian cancer, stomach cancer, and thyroid cancer. This medicine may be used for other purposes; ask your health care provider or pharmacist if you have questions. COMMON BRAND NAME(S): Adriamycin, Adriamycin PFS, Adriamycin RDF, Rubex What should I tell my health care provider before I take this medicine? They need to know if you have any of these conditions:  heart disease  history of low blood counts caused by a medicine  liver disease  recent or ongoing radiation therapy  an unusual or allergic reaction to doxorubicin, other chemotherapy  agents, other medicines, foods, dyes, or preservatives  pregnant or trying to get pregnant  breast-feeding How should I use this medicine? This drug is given as an infusion into a vein. It is administered in a hospital or clinic by a specially trained health care professional. If you have pain, swelling, burning or any unusual feeling around the site of your injection, tell your health care professional right away. Talk to your pediatrician regarding the use of this medicine in children. Special care may be needed. Overdosage: If you think you have taken too much of this medicine contact a poison control center or emergency room at once. NOTE: This medicine is only for you. Do not share this medicine with others. What if I miss a dose? It is important not to miss your dose. Call your doctor or health care professional if you are unable to keep an appointment. What may interact with this medicine? This medicine may interact with the following medications:  6-mercaptopurine  paclitaxel  phenytoin  St. John's Wort  trastuzumab  verapamil This list may not describe all possible interactions. Give your health care provider a list of all the medicines, herbs, non-prescription drugs, or dietary supplements you use. Also tell them if you smoke, drink alcohol, or use illegal drugs. Some items may interact with your medicine. What should I watch for while using this medicine? This drug may make you feel generally unwell. This is not uncommon, as chemotherapy can affect healthy cells as well as cancer cells. Report any side effects. Continue your course of treatment even though you feel ill unless your doctor tells you to stop. There is a maximum amount  of this medicine you should receive throughout your life. The amount depends on the medical condition being treated and your overall health. Your doctor will watch how much of this medicine you receive in your lifetime. Tell your doctor if you have  taken this medicine before. You may need blood work done while you are taking this medicine. Your urine may turn red for a few days after your dose. This is not blood. If your urine is dark or brown, call your doctor. In some cases, you may be given additional medicines to help with side effects. Follow all directions for their use. Call your doctor or health care professional for advice if you get a fever, chills or sore throat, or other symptoms of a cold or flu. Do not treat yourself. This drug decreases your body's ability to fight infections. Try to avoid being around people who are sick. This medicine may increase your risk to bruise or bleed. Call your doctor or health care professional if you notice any unusual bleeding. Talk to your doctor about your risk of cancer. You may be more at risk for certain types of cancers if you take this medicine. Do not become pregnant while taking this medicine or for 6 months after stopping it. Women should inform their doctor if they wish to become pregnant or think they might be pregnant. Men should not father a child while taking this medicine and for 6 months after stopping it. There is a potential for serious side effects to an unborn child. Talk to your health care professional or pharmacist for more information. Do not breast-feed an infant while taking this medicine. This medicine has caused ovarian failure in some women and reduced sperm counts in some men This medicine may interfere with the ability to have a child. Talk with your doctor or health care professional if you are concerned about your fertility. This medicine may cause a decrease in Co-Enzyme Q-10. You should make sure that you get enough Co-Enzyme Q-10 while you are taking this medicine. Discuss the foods you eat and the vitamins you take with your health care professional. What side effects may I notice from receiving this medicine? Side effects that you should report to your doctor or  health care professional as soon as possible:  allergic reactions like skin rash, itching or hives, swelling of the face, lips, or tongue  breathing problems  chest pain  fast or irregular heartbeat  low blood counts - this medicine may decrease the number of white blood cells, red blood cells and platelets. You may be at increased risk for infections and bleeding.  pain, redness, or irritation at site where injected  signs of infection - fever or chills, cough, sore throat, pain or difficulty passing urine  signs of decreased platelets or bleeding - bruising, pinpoint red spots on the skin, black, tarry stools, blood in the urine  swelling of the ankles, feet, hands  tiredness  weakness Side effects that usually do not require medical attention (report to your doctor or health care professional if they continue or are bothersome):  diarrhea  hair loss  mouth sores  nail discoloration or damage  nausea  red colored urine  vomiting This list may not describe all possible side effects. Call your doctor for medical advice about side effects. You may report side effects to FDA at 1-800-FDA-1088. Where should I keep my medicine? This drug is given in a hospital or clinic and will not be stored at home.  NOTE: This sheet is a summary. It may not cover all possible information. If you have questions about this medicine, talk to your doctor, pharmacist, or health care provider.  2020 Elsevier/Gold Standard (2016-08-13 11:01:26)  Cyclophosphamide Injection What is this medicine? CYCLOPHOSPHAMIDE (sye kloe FOSS fa mide) is a chemotherapy drug. It slows the growth of cancer cells. This medicine is used to treat many types of cancer like lymphoma, myeloma, leukemia, breast cancer, and ovarian cancer, to name a few. This medicine may be used for other purposes; ask your health care provider or pharmacist if you have questions. COMMON BRAND NAME(S): Cytoxan, Neosar What should I  tell my health care provider before I take this medicine? They need to know if you have any of these conditions:  heart disease  history of irregular heartbeat  infection  kidney disease  liver disease  low blood counts, like white cells, platelets, or red blood cells  on hemodialysis  recent or ongoing radiation therapy  scarring or thickening of the lungs  trouble passing urine  an unusual or allergic reaction to cyclophosphamide, other medicines, foods, dyes, or preservatives  pregnant or trying to get pregnant  breast-feeding How should I use this medicine? This drug is usually given as an injection into a vein or muscle or by infusion into a vein. It is administered in a hospital or clinic by a specially trained health care professional. Talk to your pediatrician regarding the use of this medicine in children. Special care may be needed. Overdosage: If you think you have taken too much of this medicine contact a poison control center or emergency room at once. NOTE: This medicine is only for you. Do not share this medicine with others. What if I miss a dose? It is important not to miss your dose. Call your doctor or health care professional if you are unable to keep an appointment. What may interact with this medicine?  amphotericin B  azathioprine  certain antivirals for HIV or hepatitis  certain medicines for blood pressure, heart disease, irregular heart beat  certain medicines that treat or prevent blood clots like warfarin  certain other medicines for cancer  cyclosporine  etanercept  indomethacin  medicines that relax muscles for surgery  medicines to increase blood counts  metronidazole This list may not describe all possible interactions. Give your health care provider a list of all the medicines, herbs, non-prescription drugs, or dietary supplements you use. Also tell them if you smoke, drink alcohol, or use illegal drugs. Some items may  interact with your medicine. What should I watch for while using this medicine? Your condition will be monitored carefully while you are receiving this medicine. You may need blood work done while you are taking this medicine. Drink water or other fluids as directed. Urinate often, even at night. Some products may contain alcohol. Ask your health care professional if this medicine contains alcohol. Be sure to tell all health care professionals you are taking this medicine. Certain medicines, like metronidazole and disulfiram, can cause an unpleasant reaction when taken with alcohol. The reaction includes flushing, headache, nausea, vomiting, sweating, and increased thirst. The reaction can last from 30 minutes to several hours. Do not become pregnant while taking this medicine or for 1 year after stopping it. Women should inform their health care professional if they wish to become pregnant or think they might be pregnant. Men should not father a child while taking this medicine and for 4 months after stopping it. There is  potential for serious side effects to an unborn child. Talk to your health care professional for more information. Do not breast-feed an infant while taking this medicine or for 1 week after stopping it. This medicine has caused ovarian failure in some women. This medicine may make it more difficult to get pregnant. Talk to your health care professional if you are concerned about your fertility. This medicine has caused decreased sperm counts in some men. This may make it more difficult to father a child. Talk to your health care professional if you are concerned about your fertility. Call your health care professional for advice if you get a fever, chills, or sore throat, or other symptoms of a cold or flu. Do not treat yourself. This medicine decreases your body's ability to fight infections. Try to avoid being around people who are sick. Avoid taking medicines that contain aspirin,  acetaminophen, ibuprofen, naproxen, or ketoprofen unless instructed by your health care professional. These medicines may hide a fever. Talk to your health care professional about your risk of cancer. You may be more at risk for certain types of cancer if you take this medicine. If you are going to need surgery or other procedure, tell your health care professional that you are using this medicine. Be careful brushing or flossing your teeth or using a toothpick because you may get an infection or bleed more easily. If you have any dental work done, tell your dentist you are receiving this medicine. What side effects may I notice from receiving this medicine? Side effects that you should report to your doctor or health care professional as soon as possible:  allergic reactions like skin rash, itching or hives, swelling of the face, lips, or tongue  breathing problems  nausea, vomiting  signs and symptoms of bleeding such as bloody or black, tarry stools; red or dark brown urine; spitting up blood or brown material that looks like coffee grounds; red spots on the skin; unusual bruising or bleeding from the eyes, gums, or nose  signs and symptoms of heart failure like fast, irregular heartbeat, sudden weight gain; swelling of the ankles, feet, hands  signs and symptoms of infection like fever; chills; cough; sore throat; pain or trouble passing urine  signs and symptoms of kidney injury like trouble passing urine or change in the amount of urine  signs and symptoms of liver injury like dark yellow or brown urine; general ill feeling or flu-like symptoms; light-colored stools; loss of appetite; nausea; right upper belly pain; unusually weak or tired; yellowing of the eyes or skin Side effects that usually do not require medical attention (report to your doctor or health care professional if they continue or are bothersome):  confusion  decreased hearing  diarrhea  facial flushing  hair  loss  headache  loss of appetite  missed menstrual periods  signs and symptoms of low red blood cells or anemia such as unusually weak or tired; feeling faint or lightheaded; falls  skin discoloration This list may not describe all possible side effects. Call your doctor for medical advice about side effects. You may report side effects to FDA at 1-800-FDA-1088. Where should I keep my medicine? This drug is given in a hospital or clinic and will not be stored at home. NOTE: This sheet is a summary. It may not cover all possible information. If you have questions about this medicine, talk to your doctor, pharmacist, or health care provider.  2020 [employer]vier/Gold Standard (2018-10-04 09:53:29)

## 2019-06-30 NOTE — Procedures (Signed)
Interventional Radiology Procedure Note  Procedure: Placement of a right IJ approach single lumen PowerPort.  Tip is positioned at the superior cavoatrial junction and catheter is ready for immediate use.  Complications: None Recommendations:  - Ok to shower tomorrow - access persists for treatment.  Ok to use - Do not submerge for 7 days - Routine line care   Signed,  Dulcy Fanny. Earleen Newport, DO

## 2019-06-30 NOTE — Consult Note (Signed)
Chief Complaint: Patient was seen in consultation today for Port-A-Cath placement  Referring Physician(s): Chauncey Cruel  Supervising Physician: Corrie Mckusick  Patient Status: [facility] - Out-pt  History of Present Illness: Kathy Barker is a 59 y.o. female with history of newly diagnosed left breast carcinoma who presents today for Port-A-Cath placement for chemotherapy. Additional medical history as below.  Past Medical History:  Diagnosis Date  . Allergic rhinitis   . Anxiety    "only during my divorce" (11/18/2012)  . Cancer ([place])   . Depression    "only during my divorce" (11/18/2012)  . GERD (gastroesophageal reflux disease)   . High cholesterol   . Hot flashes   . IBS (irritable bowel syndrome)   . Insomnia   . Migraines    "lately more often; before SVT I'd get them ~ q 6 months" (11/18/2012)  . PONV (postoperative nausea and vomiting)   . 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 CESAREAN SECTION  1992  . COMBINED HYSTERECTOMY VAGINAL / OOPHORECTOMY / A&P REPAIR  1999  . 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 SUPRAVENTRICULAR TACHYCARDIA ABLATION N/A 11/18/2012   Procedure: SUPRAVENTRICULAR TACHYCARDIA ABLATION;  Surgeon: Coralyn Mark, MD;  Location: [facility];  Service: Cardiovascular;  Laterality: N/A;  . VAGINAL HYSTERECTOMY  ~ 2000    Allergies: Fentanyl, Other, Versed [midazolam], and Latex  Medications: Prior to Admission medications   Medication Sig Start Date End Date Taking? Authorizing Provider  cetirizine (ZYRTEC) 10 MG tablet Take 10 mg by mouth daily.   Yes [provider]  LIVALO 4 MG TABS Take 1 tablet by mouth daily. 04/28/19  Yes [provider]  sertraline (ZOLOFT) 100 MG tablet Take 100 mg by mouth daily. 09/22/17  Yes [provider]  SYMBICORT 160-4.5 MCG/ACT inhaler Inhale 2 puffs into the lungs 2 (two) times daily. 05/23/19  Yes [provider]  temazepam (RESTORIL) 15 MG capsule Take 15 mg by mouth at bedtime.   Yes [provider]  dexamethasone (DECADRON) 4 MG tablet Take 2 tablets by mouth twice a day starting on the day after chemotherapy (day 2), repeat the next day (day 3) then take 2 tablets in AM only day 4, then stop.  Take with food. 06/08/19   [name], [name], MD  lidocaine-prilocaine (EMLA) cream Apply to affected area once 06/08/19   [name], [name], MD  loratadine (CLARITIN) 10 MG tablet Take 1 tablet (10 mg total) by mouth daily. 06/08/19   [name], [name], MD  LORazepam (ATIVAN) 0.5 MG tablet Use 1 tab daily as needed for anxiety and or nausea. 06/09/19   [name], [name], MD  prochlorperazine (COMPAZINE) 10 MG tablet Take 1 tablet (10 mg total) by mouth every 6 (six) hours as needed (Nausea or vomiting). 06/08/19   [name], [name], MD     Family History  Problem Relation Age of Onset  . Heart attack Father   . Alcohol abuse Father   . Heart attack Paternal Grandfather   . Heart Problems Other        both sides of family  . Breast cancer Mother     Social History   Socioeconomic History  . Marital status: Legally Separated    Spouse name: Not on file  . Number of children:  Not on file  . Years of education: Not on file  . Highest education level: Not on file  Occupational History  . Not on file  Tobacco Use  . Smoking status: Never Smoker  . Smokeless tobacco: Never Used  Vaping Use  . Vaping Use: Never used  Substance and Sexual Activity  . Alcohol use: Yes    Comment: 11/18/2012 "shot of brandy couple times/month"  . Drug use: No  . Sexual activity: Not Currently  Other Topics Concern  . Not on file  Social History Narrative   Lives in [place] with son age 10.  Works as a Armed forces logistics/support/administrative officer.   Social Determinants of Health   Financial Resource  Strain:   . Difficulty of Paying Living Expenses:   Food Insecurity:   . Worried About Charity fundraiser in the Last Year:   . Arboriculturist in the Last Year:   Transportation Needs:   . Film/video editor (Medical):   [name] Lack of Transportation (Non-Medical):   Physical Activity:   . Days of Exercise per Week:   . Minutes of Exercise per Session:   Stress:   . Feeling of Stress :   Social Connections:   . Frequency of Communication with Friends and Family:   . Frequency of Social Gatherings with Friends and Family:   . Attends Religious Services:   . Active Member of Clubs or Organizations:   . Attends Archivist Meetings:   [name] Marital Status:         Review of Systems currently denies fever, headache, chest pain, dyspnea, cough, abdominal/back pain, nausea, vomiting or bleeding.  Vital Signs: BP (!) 141/90   Pulse 89   Temp 98.3 F (36.8 C) (Oral)   Resp 16   SpO2 99%   Physical Exam awake, alert. Chest clear to auscultation bilaterally. Heart with regular rate and rhythm. Abdomen soft, positive bowel sounds, nontender. No lower extremity edema.  Imaging: ECHOCARDIOGRAM COMPLETE  Result Date: 06/28/2019    ECHOCARDIOGRAM REPORT   Patient Name:   Kathy Barker Date of Exam: 06/28/2019 Medical Rec #:  [id number]     Height:       62.5 in Accession #:    [id number]    Weight:       129.6 lb Date of Birth:  04-Apr-1960      BSA:          1.599 m Patient Age:    40 years      BP:           146/80 mmHg Patient Gender: F             HR:           65 bpm. Exam Location:  Outpatient Procedure: 2D Echo, 3D Echo, Cardiac Doppler, Color Doppler and Strain Analysis Indications:    Z51.11 Encounter for antineoplastic chemotheraphy  History:        Patient has prior history of Echocardiogram examinations, most                 recent 10/07/2012. Risk Factors:Dyslipidemia and GERD.  Sonographer:    Jonelle Sidle Dance Referring Phys: [place]  1. Left  ventricular ejection fraction, by estimation, is 60 to 65%. The left ventricle has normal function. The left ventricle has no regional wall motion abnormalities. Left ventricular diastolic parameters are indeterminate. The average left ventricular global longitudinal strain is -16.5 %. The global longitudinal strain  is normal.  2. Right ventricular systolic function is normal. The right ventricular size is normal. There is normal pulmonary artery systolic pressure.  3. The mitral valve is normal in structure. Mild mitral valve regurgitation. No evidence of mitral stenosis.  4. The aortic valve is normal in structure. Aortic valve regurgitation is not visualized. No aortic stenosis is present.  5. The inferior vena cava is normal in size with greater than 50% respiratory variability, suggesting right atrial pressure of 3 mmHg.  6. Compared to echo 2014, no significant change. FINDINGS  Left Ventricle: Left ventricular ejection fraction, by estimation, is 60 to 65%. The left ventricle has normal function. The left ventricle has no regional wall motion abnormalities. The average left ventricular global longitudinal strain is -16.5 %. The global longitudinal strain is normal. The left ventricular internal cavity size was normal in size. There is no left ventricular hypertrophy. Left ventricular diastolic parameters are indeterminate. Normal left ventricular filling pressure. Right Ventricle: The right ventricular size is normal. No increase in right ventricular wall thickness. Right ventricular systolic function is normal. There is normal pulmonary artery systolic pressure. The tricuspid regurgitant velocity is 2.12 m/s, and  with an assumed right atrial pressure of 3 mmHg, the estimated right ventricular systolic pressure is 40.9 mmHg. Left Atrium: Left atrial size was normal in size. Right Atrium: Right atrial size was normal in size. Pericardium: There is no evidence of pericardial effusion. Mitral Valve: The mitral  valve is normal in structure. Normal mobility of the mitral valve leaflets. Mild mitral valve regurgitation. No evidence of mitral valve stenosis. Tricuspid Valve: The tricuspid valve is normal in structure. Tricuspid valve regurgitation is not demonstrated. No evidence of tricuspid stenosis. Aortic Valve: The aortic valve is normal in structure. Aortic valve regurgitation is not visualized. No aortic stenosis is present. Pulmonic Valve: The pulmonic valve was normal in structure. Pulmonic valve regurgitation is not visualized. No evidence of pulmonic stenosis. Aorta: The aortic root is normal in size and structure. Venous: The inferior vena cava is normal in size with greater than 50% respiratory variability, suggesting right atrial pressure of 3 mmHg. IAS/Shunts: The interatrial septum appears to be lipomatous. No atrial level shunt detected by color flow Doppler.  LEFT VENTRICLE PLAX 2D LVIDd:         3.60 cm  Diastology LVIDs:         2.50 cm  LV e' lateral:   9.23 cm/s LV PW:         1.20 cm  LV E/e' lateral: 10.1 LV IVS:        1.00 cm  LV e' medial:    6.74 cm/s LVOT diam:     1.50 cm  LV E/e' medial:  13.8 LV SV:         33 LV SV Index:   21       2D Longitudinal Strain LVOT Area:     1.77 cm 2D Strain GLS (A2C):   -17.2 %                         2D Strain GLS (A3C):   -13.8 %                         2D Strain GLS (A4C):   -18.5 %  2D Strain GLS Avg:     -16.5 %                          3D Volume EF:                         3D EF:        59 %                         LV EDV:       106 ml                         LV ESV:       43 ml                         LV SV:        63 ml RIGHT VENTRICLE             IVC RV Basal diam:  2.10 cm     IVC diam: 1.30 cm RV S prime:     10.10 cm/s TAPSE (M-mode): 1.7 cm LEFT ATRIUM             Index       RIGHT ATRIUM          Index LA diam:        3.30 cm 2.06 cm/m  RA Area:     8.21 cm LA Vol (A2C):   32.2 ml 20.13 ml/m RA Volume:   15.00 ml 9.38  ml/m LA Vol (A4C):   30.7 ml 19.20 ml/m LA Biplane Vol: 32.3 ml 20.20 ml/m  AORTIC VALVE LVOT Vmax:   82.50 cm/s LVOT Vmean:  54.100 cm/s LVOT VTI:    0.188 m  AORTA Ao Root diam: 2.70 cm Ao Asc diam:  2.80 cm MITRAL VALVE               TRICUSPID VALVE MV Area (PHT): 4.06 cm    TR Peak grad:   18.0 mmHg MV Decel Time: 187 msec    TR Vmax:        212.00 cm/s MV E velocity: 93.40 cm/s MV A velocity: 65.60 cm/s  SHUNTS MV E/A ratio:  1.42        Systemic VTI:  0.19 m                            Systemic Diam: 1.50 cm Fransico Him MD Electronically signed by Fransico Him MD Signature Date/Time: 06/28/2019/1:25:36 PM    Final     Labs:  CBC: Recent Labs    06/08/19 0831 06/28/19 1233  WBC 6.8 6.2  HGB 13.4 14.0  HCT 41.6 41.9  PLT 178 206    COAGS: No results for input(s): INR, APTT in the last 8760 hours.  BMP: Recent Labs    06/08/19 0831 06/28/19 1233  NA 142 144  K 4.6 4.3  CL 107 106  CO2 27 28  GLUCOSE 100* 90  BUN 8 7  CALCIUM 9.3 9.8  CREATININE 0.79 0.79  GFRNONAA >60 >60  GFRAA >60 >60    LIVER FUNCTION TESTS: Recent Labs    06/08/19 0831 06/28/19 1233  BILITOT 0.3 0.4  AST 17 21  ALT 23 31  ALKPHOS 98 101  PROT 6.7 7.0  ALBUMIN 4.0 4.3    TUMOR MARKERS: No results for input(s): AFPTM, CEA, CA199, CHROMGRNA in the last 8760 hours.  Assessment and Plan: 59 y.o. female with history of newly diagnosed left breast carcinoma who presents today for Port-A-Cath placement for chemotherapy.Risks and benefits of image guided port-a-catheter placement was discussed with the patient including, but not limited to bleeding, infection, pneumothorax, or fibrin sheath development and need for additional procedures.  All of the patient's questions were answered, patient is agreeable to proceed. Consent signed and in chart.     Thank you for this interesting consult.  I greatly enjoyed meeting Aneshia L Guynes and look forward to participating in their care.  A copy of  this report was sent to the requesting provider on this date.  Electronically Signed: D. Rowe Robert, PA-C 06/30/2019, 8:34 AM   I spent a total of 25 minutes    in face to face in clinical consultation, greater than 50% of which was counseling/coordinating care for Port-A-Cath placement

## 2019-06-30 NOTE — Discharge Instructions (Signed)
DO NOT APPLY ANY EMLA CREAM OR LOTIONS TO THE PORT SITE X 2 WEEKS  DO NOT SHOWER X 24 HRS   FOR ANY QUESTIONS OR CONCERNS CALL [phone]  Implanted Port Insertion, Care After This sheet gives you information about how to care for yourself after your procedure. Your health care provider may also give you more specific instructions. If you have problems or questions, contact your health care provider. What can I expect after the procedure? After the procedure, it is common to have:  Discomfort at the port insertion site.  Bruising on the skin over the port. This should improve over 3-4 days. Follow these instructions at home: [facility] care  After your port is placed, you will get a manufacturer's information card. The card has information about your port. Keep this card with you at all times.  Take care of the port as told by your health care provider. Ask your health care provider if you or a family member can get training for taking care of the port at home. A home health care nurse may also take care of the port.  Make sure to remember what type of port you have. Incision care      Follow instructions from your health care provider about how to take care of your port insertion site. Make sure you: ? Wash your hands with soap and water before and after you change your bandage (dressing). If soap and water are not available, use hand sanitizer. ? Change your dressing as told by your health care provider. ? Leave stitches (sutures), skin glue, or adhesive strips in place. These skin closures may need to stay in place for 2 weeks or longer. If adhesive strip edges start to loosen and curl up, you may trim the loose edges. Do not remove adhesive strips completely unless your health care provider tells you to do that.  Check your port insertion site every day for signs of infection. Check for: ? Redness, swelling, or pain. ? Fluid or blood. ? Warmth. ? Pus or a bad  smell. Activity  Return to your normal activities as told by your health care provider. Ask your health care provider what activities are safe for you.  Do not lift anything that is heavier than 10 lb (4.5 kg), or the limit that you are told, until your health care provider says that it is safe. General instructions  Take over-the-counter and prescription medicines only as told by your health care provider.  Do not take baths, swim, or use a hot tub until your health care provider approves. Ask your health care provider if you may take showers. You may only be allowed to take sponge baths.  Do not drive for 24 hours if you were given a sedative during your procedure.  Wear a medical alert bracelet in case of an emergency. This will tell any health care providers that you have a port.  Keep all follow-up visits as told by your health care provider. This is important. Contact a health care provider if:  You cannot flush your port with saline as directed, or you cannot draw blood from the port.  You have a fever or chills.  You have redness, swelling, or pain around your port insertion site.  You have fluid or blood coming from your port insertion site.  Your port insertion site feels warm to the touch.  You have pus or a bad smell coming from the port insertion site. Get help right away if:  You have chest pain or shortness of breath.  You have bleeding from your port that you cannot control. Summary  Take care of the port as told by your health care provider. Keep the manufacturer's information card with you at all times.  Change your dressing as told by your health care provider.  Contact a health care provider if you have a fever or chills or if you have redness, swelling, or pain around your port insertion site.  Keep all follow-up visits as told by your health care provider. This information is not intended to replace advice given to you by your health care provider.  Make sure you discuss any questions you have with your health care provider. Document Revised: 07/28/2017 Document Reviewed: 07/28/2017 Elsevier Patient Education  [place] An implanted port is a device that is placed under the skin. It is usually placed in the chest. The device can be used to give IV medicine, to take blood, or for dialysis. You may have an implanted port if:  You need IV medicine that would be irritating to the small veins in your hands or arms.  You need IV medicines, such as antibiotics, for a long period of time.  You need IV nutrition for a long period of time.  You need dialysis. Having a port means that your health care provider will not need to use the veins in your arms for these procedures. You may have fewer limitations when using a port than you would if you used other types of long-term IVs, and you will likely be able to return to normal activities after your incision heals. An implanted port has two main parts:  Reservoir. The reservoir is the part where a needle is inserted to give medicines or draw blood. The reservoir is round. After it is placed, it appears as a small, raised area under your skin.  Catheter. The catheter is a thin, flexible tube that connects the reservoir to a vein. Medicine that is inserted into the reservoir goes into the catheter and then into the vein. How is my port accessed? To access your port:  A numbing cream may be placed on the skin over the port site.  Your health care provider will put on a mask and sterile gloves.  The skin over your port will be cleaned carefully with a germ-killing soap and allowed to dry.  Your health care provider will gently pinch the port and insert a needle into it.  Your health care provider will check for a blood return to make sure the port is in the vein and is not clogged.  If your port needs to remain accessed to get medicine continuously (constant  infusion), your health care provider will place a clear bandage (dressing) over the needle site. The dressing and needle will need to be changed every week, or as told by your health care provider. What is flushing? Flushing helps keep the port from getting clogged. Follow instructions from your health care provider about how and when to flush the port. Ports are usually flushed with saline solution or a medicine called heparin. The need for flushing will depend on how the port is used:  If the port is only used from time to time to give medicines or draw blood, the port may need to be flushed: ? Before and after medicines have been given. ? Before and after blood has been drawn. ? As part of routine maintenance. Flushing may  be recommended every 4-6 weeks.  If a constant infusion is running, the port may not need to be flushed.  Throw away any syringes in a disposal container that is meant for sharp items (sharps container). You can buy a sharps container from a pharmacy, or you can make one by using an empty hard plastic bottle with a cover. How long will my port stay implanted? The port can stay in for as long as your health care provider thinks it is needed. When it is time for the port to come out, a surgery will be done to remove it. The surgery will be similar to the procedure that was done to put the port in. Follow these instructions at home:   Flush your port as told by your health care provider.  If you need an infusion over several days, follow instructions from your health care provider about how to take care of your port site. Make sure you: ? Wash your hands with soap and water before you change your dressing. If soap and water are not available, use alcohol-based hand sanitizer. ? Change your dressing as told by your health care provider. ? Place any used dressings or infusion bags into a plastic bag. Throw that bag in the trash. ? Keep the dressing that covers the needle clean  and dry. Do not get it wet. ? Do not use scissors or sharp objects near the tube. ? Keep the tube clamped, unless it is being used.  Check your port site every day for signs of infection. Check for: ? Redness, swelling, or pain. ? Fluid or blood. ? Pus or a bad smell.  Protect the skin around the port site. ? Avoid wearing bra straps that rub or irritate the site. ? Protect the skin around your port from seat belts. Place a soft pad over your chest if needed.  Bathe or shower as told by your health care provider. The site may get wet as long as you are not actively receiving an infusion.  Return to your normal activities as told by your health care provider. Ask your health care provider what activities are safe for you.  Carry a medical alert card or wear a medical alert bracelet at all times. This will let health care providers know that you have an implanted port in case of an emergency. Get help right away if:  You have redness, swelling, or pain at the port site.  You have fluid or blood coming from your port site.  You have pus or a bad smell coming from the port site.  You have a fever. Summary  Implanted ports are usually placed in the chest for long-term IV access.  Follow instructions from your health care provider about flushing the port and changing bandages (dressings).  Take care of the area around your port by avoiding clothing that puts pressure on the area, and by watching for signs of infection.  Protect the skin around your port from seat belts. Place a soft pad over your chest if needed.  Get help right away if you have a fever or you have redness, swelling, pain, drainage, or a bad smell at the port site. This information is not intended to replace advice given to you by your health care provider. Make sure you discuss any questions you have with your health care provider. Document Revised: 04/23/2018 Document Reviewed: 02/02/2016 Elsevier Patient  Education  [place]. Moderate Conscious Sedation, Adult, Care After These instructions provide  you with information about caring for yourself after your procedure. Your health care provider may also give you more specific instructions. Your treatment has been planned according to current medical practices, but problems sometimes occur. Call your health care provider if you have any problems or questions after your procedure. What can I expect after the procedure? After your procedure, it is common:  To feel sleepy for several hours.  To feel clumsy and have poor balance for several hours.  To have poor judgment for several hours.  To vomit if you eat too soon. Follow these instructions at home: For at least 24 hours after the procedure:   Do not: ? Participate in activities where you could fall or become injured. ? Drive. ? Use heavy machinery. ? Drink alcohol. ? Take sleeping pills or medicines that cause drowsiness. ? Make important decisions or sign legal documents. ? Take care of children on your own.  Rest. Eating and drinking  Follow the diet recommended by your health care provider.  If you vomit: ? Drink water, juice, or soup when you can drink without vomiting. ? Make sure you have little or no nausea before eating solid foods. General instructions  Have a responsible adult stay with you until you are awake and alert.  Take over-the-counter and prescription medicines only as told by your health care provider.  If you smoke, do not smoke without supervision.  Keep all follow-up visits as told by your health care provider. This is important. Contact a health care provider if:  You keep feeling nauseous or you keep vomiting.  You feel light-headed.  You develop a rash.  You have a fever. Get help right away if:  You have trouble breathing. This information is not intended to replace advice given to you by your health care provider. Make sure you  discuss any questions you have with your health care provider. Document Revised: 12/12/2016 Document Reviewed: 04/21/2015 Elsevier Patient Education  2020 [employer].

## 2019-07-01 ENCOUNTER — Encounter: Payer: Self-pay | Admitting: Oncology

## 2019-07-01 ENCOUNTER — Telehealth: Payer: Self-pay | Admitting: *Deleted

## 2019-07-02 ENCOUNTER — Inpatient Hospital Stay: Payer: 59

## 2019-07-02 ENCOUNTER — Other Ambulatory Visit: Payer: Self-pay

## 2019-07-02 VITALS — BP 155/69 | HR 89 | Temp 99.1°F | Resp 18

## 2019-07-02 DIAGNOSIS — Z803 Family history of malignant neoplasm of breast: Secondary | ICD-10-CM | POA: Diagnosis not present

## 2019-07-02 DIAGNOSIS — R5383 Other fatigue: Secondary | ICD-10-CM | POA: Diagnosis not present

## 2019-07-02 DIAGNOSIS — E785 Hyperlipidemia, unspecified: Secondary | ICD-10-CM | POA: Diagnosis not present

## 2019-07-02 DIAGNOSIS — Z8249 Family history of ischemic heart disease and other diseases of the circulatory system: Secondary | ICD-10-CM | POA: Diagnosis not present

## 2019-07-02 DIAGNOSIS — C50312 Malignant neoplasm of lower-inner quadrant of left female breast: Secondary | ICD-10-CM | POA: Diagnosis not present

## 2019-07-02 DIAGNOSIS — Z90721 Acquired absence of ovaries, unilateral: Secondary | ICD-10-CM | POA: Diagnosis not present

## 2019-07-02 DIAGNOSIS — Z170 Estrogen receptor positive status [ER+]: Secondary | ICD-10-CM | POA: Diagnosis not present

## 2019-07-02 DIAGNOSIS — K219 Gastro-esophageal reflux disease without esophagitis: Secondary | ICD-10-CM | POA: Diagnosis not present

## 2019-07-02 DIAGNOSIS — Z885 Allergy status to narcotic agent status: Secondary | ICD-10-CM | POA: Diagnosis not present

## 2019-07-02 DIAGNOSIS — Z79899 Other long term (current) drug therapy: Secondary | ICD-10-CM | POA: Diagnosis not present

## 2019-07-02 DIAGNOSIS — R110 Nausea: Secondary | ICD-10-CM | POA: Diagnosis not present

## 2019-07-02 DIAGNOSIS — Z811 Family history of alcohol abuse and dependence: Secondary | ICD-10-CM | POA: Diagnosis not present

## 2019-07-02 DIAGNOSIS — Z5189 Encounter for other specified aftercare: Secondary | ICD-10-CM | POA: Diagnosis not present

## 2019-07-02 DIAGNOSIS — Z7952 Long term (current) use of systemic steroids: Secondary | ICD-10-CM | POA: Diagnosis not present

## 2019-07-02 DIAGNOSIS — I340 Nonrheumatic mitral (valve) insufficiency: Secondary | ICD-10-CM | POA: Diagnosis not present

## 2019-07-02 MED ORDER — PEGFILGRASTIM-CBQV 6 MG/0.6ML ~~LOC~~ SOSY
6.0000 mg | PREFILLED_SYRINGE | Freq: Once | SUBCUTANEOUS | Status: AC
  Administered 2019-07-02: 6 mg via SUBCUTANEOUS

## 2019-07-04 ENCOUNTER — Telehealth: Payer: Self-pay | Admitting: *Deleted

## 2019-07-04 NOTE — Telephone Encounter (Signed)
Left message for a return phone call to discuss recent MRI and need for add bx.

## 2019-07-05 ENCOUNTER — Telehealth: Payer: Self-pay | Admitting: *Deleted

## 2019-07-05 ENCOUNTER — Other Ambulatory Visit: Payer: Managed Care, Other (non HMO)

## 2019-07-05 ENCOUNTER — Ambulatory Visit: Payer: Managed Care, Other (non HMO)

## 2019-07-05 NOTE — Telephone Encounter (Signed)
Left another voicemail for a return phone call to discuss her MRI.

## 2019-07-06 ENCOUNTER — Telehealth: Payer: Self-pay | Admitting: *Deleted

## 2019-07-06 NOTE — Telephone Encounter (Signed)
Called pt and left vm regarding MRI results and recommendations. Contact information provided for return call.  Informed [name] regarding inability to reach pt to discuss MRI results.

## 2019-07-07 ENCOUNTER — Encounter: Payer: Self-pay | Admitting: *Deleted

## 2019-07-07 ENCOUNTER — Ambulatory Visit: Payer: Managed Care, Other (non HMO)

## 2019-07-07 ENCOUNTER — Inpatient Hospital Stay (HOSPITAL_BASED_OUTPATIENT_CLINIC_OR_DEPARTMENT_OTHER): Payer: 59 | Admitting: Adult Health

## 2019-07-07 ENCOUNTER — Other Ambulatory Visit: Payer: Self-pay

## 2019-07-07 ENCOUNTER — Inpatient Hospital Stay: Payer: 59

## 2019-07-07 VITALS — BP 137/88 | HR 99 | Temp 98.5°F | Resp 17 | Ht 62.5 in | Wt 128.5 lb

## 2019-07-07 DIAGNOSIS — C50312 Malignant neoplasm of lower-inner quadrant of left female breast: Secondary | ICD-10-CM

## 2019-07-07 DIAGNOSIS — Z170 Estrogen receptor positive status [ER+]: Secondary | ICD-10-CM | POA: Diagnosis not present

## 2019-07-07 DIAGNOSIS — Z95828 Presence of other vascular implants and grafts: Secondary | ICD-10-CM

## 2019-07-07 LAB — COMPREHENSIVE METABOLIC PANEL
ALT: 15 U/L (ref 0–44)
AST: 8 U/L — ABNORMAL LOW (ref 15–41)
Albumin: 4 g/dL (ref 3.5–5.0)
Alkaline Phosphatase: 99 U/L (ref 38–126)
Anion gap: 11 (ref 5–15)
BUN: 7 mg/dL (ref 6–20)
CO2: 23 mmol/L (ref 22–32)
Calcium: 9.8 mg/dL (ref 8.9–10.3)
Chloride: 105 mmol/L (ref 98–111)
Creatinine, Ser: 0.71 mg/dL (ref 0.44–1.00)
GFR calc Af Amer: >60 mL/min (ref >60)
GFR calc non Af Amer: >60 mL/min (ref >60)
Glucose, Bld: 106 mg/dL — ABNORMAL HIGH (ref 70–99)
Potassium: 4.5 mmol/L (ref 3.5–5.1)
Sodium: 139 mmol/L (ref 135–145)
Total Bilirubin: 0.4 mg/dL (ref 0.3–1.2)
Total Protein: 6.7 g/dL (ref 6.5–8.1)

## 2019-07-07 LAB — CBC WITH DIFFERENTIAL/PLATELET
Abs Immature Granulocytes: 0.08 10*3/uL — ABNORMAL HIGH (ref 0.00–0.07)
Basophils Absolute: 0 10*3/uL (ref 0.0–0.1)
Basophils Relative: 2 %
Eosinophils Absolute: 0.1 10*3/uL (ref 0.0–0.5)
Eosinophils Relative: 3 %
HCT: 40.3 % (ref 36.0–46.0)
Hemoglobin: 13.2 g/dL (ref 12.0–15.0)
Immature Granulocytes: 3 %
Lymphocytes Relative: 58 %
Lymphs Abs: 1.4 10*3/uL (ref 0.7–4.0)
MCH: 31.2 pg (ref 26.0–34.0)
MCHC: 32.8 g/dL (ref 30.0–36.0)
MCV: 95.3 fL (ref 80.0–100.0)
Monocytes Absolute: 0.1 10*3/uL (ref 0.1–1.0)
Monocytes Relative: 6 %
Neutro Abs: 0.7 10*3/uL — ABNORMAL LOW (ref 1.7–7.7)
Neutrophils Relative %: 28 %
Platelets: 123 10*3/uL — ABNORMAL LOW (ref 150–400)
RBC: 4.23 MIL/uL (ref 3.87–5.11)
RDW: 12.2 % (ref 11.5–15.5)
WBC: 2.3 10*3/uL — ABNORMAL LOW (ref 4.0–10.5)
nRBC: 0 % (ref 0.0–0.2)

## 2019-07-07 MED ORDER — SODIUM CHLORIDE 0.9% FLUSH
10.0000 mL | Freq: Once | INTRAVENOUS | Status: AC
  Administered 2019-07-07: 10 mL
  Filled 2019-07-07: qty 10

## 2019-07-07 NOTE — Progress Notes (Signed)
Kathy Barker:([phone] [phone]     ID: Kathy Barker DOB: 05-10-1960  MR#: [id number]  VAP#:[id number]  Patient Care Team: [name], MD as PCP - General (Family Medicine) Rockwell Germany, RN as Oncology Nurse Navigator Mauro Kaufmann, RN as Oncology Nurse Navigator [name], MD as Consulting Physician (General Surgery) [name], [name], MD as Consulting Physician (Oncology) [name], MD as Attending Physician (Radiation Oncology) Lavonna Monarch, MD as Consulting Physician (Dermatology) Scot Dock, NP OTHER MD:  CHIEF COMPLAINT: Left-sided breast cancer  CURRENT TREATMENT: Neoadjuvant chemotherapy   INTERVAL HISTORY: Kathy Barker returns today for follow up of her left-sided breast cancer. She was started on neoadjvuant chematoherapy with doxorubicin and cyclophosphamide given on day 1 of a 14 day schedule, with Udenyca support on day 3.  She is cycle 1 day 8 of her therapy.     Kathy Barker underwent MRI of the breast that noted additional areas of enhancement in the left breast that were of concern.  We reviewed that if she wants breast conservation, that additional biopsies are warranted.    REVIEW OF SYSTEMS: Kathy Barker notes that she is feeling very well today.  She has no new issues, such as fever, chills.  She did not have any nausea or vomiting following her treatment.  She is without cough, shortness of breath, chest pain or palpitations.  She denies any new issues today.    HISTORY OF CURRENT ILLNESS: From the original intake note:  Kathy Barker herself noted a palpable lower-inner left breast lump and immediately brought it to medical attention.. She underwent bilateral diagnostic mammography with tomography and left breast ultrasonography at [facility] on 05/30/2019 showing: breast density category B; 1.7 cm lobulated mass in left breast at 8 o'clock; no significant left axillary abnormalities.  Accordingly on 06/01/2019 she proceeded  to biopsy of the left breast area in question. The pathology from this procedure ([id number]) showed: invasive ductal carcinoma, grade 3. Prognostic indicators significant for: estrogen receptor, 70% positive with weak staining intensity and progesterone receptor, 0% negative. Proliferation marker Ki67 at 80%. HER2 negative by immunohistochemistry (1+).  The patient's subsequent history is as detailed below.   PAST MEDICAL HISTORY: Past Medical History:  Diagnosis Date  . Allergic rhinitis   . Anxiety    "only during my divorce" (11/18/2012)  . Cancer ([place])   . Depression    "only during my divorce" (11/18/2012)  . GERD (gastroesophageal reflux disease)   . High cholesterol   . Hot flashes   . IBS (irritable bowel syndrome)   . Insomnia   . Migraines    "lately more often; before SVT I'd get them ~ q 6 months" (11/18/2012)  . PONV (postoperative nausea and vomiting)   . 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 CESAREAN SECTION  1992  . COMBINED HYSTERECTOMY VAGINAL / OOPHORECTOMY / A&Barker REPAIR  1999  . IR IMAGING GUIDED PORT INSERTION  06/30/2019  . 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 SUPRAVENTRICULAR TACHYCARDIA ABLATION N/A 11/18/2012   Procedure: SUPRAVENTRICULAR TACHYCARDIA ABLATION;  Surgeon: Coralyn Mark, MD;  Location: [facility] CATH [facility];  Service: Cardiovascular;  Laterality: N/A;  . VAGINAL HYSTERECTOMY  ~ 2000    FAMILY HISTORY: Family History  Problem Relation Age of Onset  .  Heart attack Father   . Alcohol abuse Father   . Heart attack Paternal Grandfather   . Heart Problems Other        both sides of family  . Breast cancer Mother    Her father died at age 84 from alcohol abuse. Her mother is living at age 64 (as of 05/2019) and has a history of DCIS at age 28 and  invasive lobular breast cancer at age 40. Kathy Barker has one brother. She reports cancer of an unknown type in her maternal grandmother and prostate cancer in a maternal uncle.   GYNECOLOGIC HISTORY:  No LMP recorded. Patient has had a hysterectomy. Menarche: 59 years old Age at first live birth: 59 years old Kathy Barker 2 LMP 2000 Contraceptive: never used HRT never used  Hysterectomy? yes BSO? 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  She will be on temporary disability during her chemotherapy.  She is divorced. She lives at home with son Kathy Barker, age 29, who works in Biomedical scientist in [place]. Son Kathy Barker, age 71, works in Sales executive here in [place]. 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     ADVANCED DIRECTIVES: Not in place. She intends to name both of her sons as her 26.   HEALTH MAINTENANCE: Social History   Tobacco Use  . Smoking status: Never Smoker  . Smokeless tobacco: Never Used  Vaping Use  . Vaping Use: Never used  Substance Use Topics  . Alcohol use: Yes    Comment: 11/18/2012 "shot of brandy couple times/month"  . Drug use: No     Colonoscopy: yes, date unsure  PAP: date unsure  Bone density: never done   Allergies  Allergen Reactions  . Fentanyl Nausea And Vomiting    Per patient " extreme nausea and vomiting "   . Other     Problems with stitches  . Versed [Midazolam] Nausea And Vomiting    Per patient " extreme N/V"   . Latex Rash    Current Outpatient Medications  Medication Sig Dispense Refill  . cetirizine (ZYRTEC) 10 MG tablet Take 10 mg by mouth daily.    [name] dexamethasone (DECADRON) 4 MG tablet Take 2 tablets by mouth twice a day starting on the day after chemotherapy (day 2), repeat the next day (day 3) then take 2 tablets in AM only day 4, then stop.  Take with food. 30 tablet 1  . lidocaine-prilocaine (EMLA) cream Apply to affected area once 30 g 3  . LIVALO 4 MG TABS  Take 1 tablet by mouth daily.    [name] loratadine (CLARITIN) 10 MG tablet Take 1 tablet (10 mg total) by mouth daily. 60 tablet 3  . LORazepam (ATIVAN) 0.5 MG tablet Use 1 tab daily as needed for anxiety and or nausea. 30 tablet 0  . prochlorperazine (COMPAZINE) 10 MG tablet Take 1 tablet (10 mg total) by mouth every 6 (six) hours as needed (Nausea or vomiting). 30 tablet 1  . sertraline (ZOLOFT) 100 MG tablet Take 100 mg by mouth daily.    . SYMBICORT 160-4.5 MCG/ACT inhaler Inhale 2 puffs into the lungs 2 (two) times daily.    . temazepam (RESTORIL) 15 MG capsule Take 15 mg by mouth at bedtime.     No current facility-administered medications for this visit.    OBJECTIVE: White woman who appears stated age  59:   07/07/19 1205  BP: 137/88  Pulse: 99  Resp: 17  Temp: 98.5 F (36.9 C)  SpO2: 99%     Body mass index is 23.13 kg/m.   Wt Readings from Last 3 Encounters:  07/07/19 128 lb 8 oz (58.3 kg)  06/28/19 128 lb 6.4 oz (58.2 kg)  06/08/19 129 lb 9.6 oz (58.8 kg)      ECOG FS:1 - Symptomatic but completely ambulatory  GENERAL: Patient is a well appearing female in no acute distress HEENT:  Sclerae anicteric.  Mask in place. Neck is supple.  NODES:  No cervical, supraclavicular, or axillary lymphadenopathy palpated.  BREAST EXAM:  Deferred. LUNGS:  Clear to auscultation bilaterally.  No wheezes or rhonchi. HEART:  Regular rate and rhythm. No murmur appreciated. ABDOMEN:  Soft, nontender.  Positive, normoactive bowel sounds. No organomegaly palpated. MSK:  No focal spinal tenderness to palpation. Full range of motion bilaterally in the upper extremities. EXTREMITIES:  No peripheral edema.   SKIN:  Clear with no obvious rashes or skin changes. No nail dyscrasia. NEURO:  Nonfocal. Well oriented.  Appropriate affect.     LAB RESULTS:  CMP     Component Value Date/Time   NA 139 07/07/2019 1200   K 4.5 07/07/2019 1200   CL 105 07/07/2019 1200   CO2 23 07/07/2019 1200     GLUCOSE 106 (H) 07/07/2019 1200   BUN 7 07/07/2019 1200   CREATININE 0.71 07/07/2019 1200   CREATININE 0.79 06/08/2019 0831   CALCIUM 9.8 07/07/2019 1200   PROT 6.7 07/07/2019 1200   ALBUMIN 4.0 07/07/2019 1200   AST 8 (L) 07/07/2019 1200   AST 17 06/08/2019 0831   ALT 15 07/07/2019 1200   ALT 23 06/08/2019 0831   ALKPHOS 99 07/07/2019 1200   BILITOT 0.4 07/07/2019 1200   BILITOT 0.3 06/08/2019 0831   GFRNONAA >60 07/07/2019 1200   GFRNONAA >60 06/08/2019 0831   GFRAA >60 07/07/2019 1200   GFRAA >60 06/08/2019 0831    No results found for: TOTALPROTELP, ALBUMINELP, A1GS, A2GS, BETS, BETA2SER, GAMS, MSPIKE, SPEI  Lab Results  Component Value Date   WBC 2.3 (L) 07/07/2019   NEUTROABS 0.7 (L) 07/07/2019   HGB 13.2 07/07/2019   HCT 40.3 07/07/2019   MCV 95.3 07/07/2019   PLT 123 (L) 07/07/2019    No results found for: LABCA2  No components found for: [id number]  No results for input(s): INR in the last 168 hours.  No results found for: LABCA2  No results found for: NIO270  No results found for: JJK093  No results found for: GHW299  No results found for: CA2729  No components found for: HGQUANT  No results found for: CEA1 / No results found for: CEA1   No results found for: AFPTUMOR  No results found for: CHROMOGRNA  No results found for: KPAFRELGTCHN, LAMBDASER, KAPLAMBRATIO (kappa/lambda light chains)  No results found for: HGBA, HGBA2QUANT, HGBFQUANT, HGBSQUAN (Hemoglobinopathy evaluation)   No results found for: LDH  No results found for: IRON, TIBC, IRONPCTSAT (Iron and TIBC)  No results found for: FERRITIN  Urinalysis No results found for: COLORURINE, APPEARANCEUR, LABSPEC, PHURINE, GLUCOSEU, HGBUR, BILIRUBINUR, KETONESUR, PROTEINUR, UROBILINOGEN, NITRITE, LEUKOCYTESUR   STUDIES: MR BREAST BILATERAL W WO CONTRAST INC CAD  Result Date: 06/30/2019 CLINICAL DATA:  59 year old female with recently diagnosed grade 3 invasive ductal carcinoma  of the left breast post ultrasound-guided biopsy of a 1.7 cm mass at the 8 o'clock position 6 cm from nipple. Family history of breast cancer with patient's mother having been diagnosed with breast  cancer. LABS:  Not applicable. EXAM: BILATERAL BREAST MRI WITH AND WITHOUT CONTRAST TECHNIQUE: Multiplanar, multisequence MR images of both breasts were obtained prior to and following the intravenous administration of 6 ml of Gadavist Three-dimensional MR images were rendered by post-processing of the original MR data on an independent workstation. The three-dimensional MR images were interpreted, and findings are reported in the following complete MRI report for this study. Three dimensional images were evaluated at the independent DynaCad workstation COMPARISON:  Previous exams. FINDINGS: Breast composition: b.  Scattered fibroglandular tissue. Background parenchymal enhancement: There is mild to moderate background parenchymal enhancement with multiple scattered enhancing foci in each breast which are considered benign given multiplicity and bilaterality. Right breast: No mass or abnormal enhancement. Left breast: Irregular enhancing mass in the lower inner left breast measures 2.7 cm AP, 1.9 cm transverse and 1.2 cm craniocaudal. Biopsy marking clip artifact is present in the superior portion of this mass. There is linear oriented clumped non mass enhancement within the upper central left breast mid depth (subtraction image 83) measuring 1.6 cm. There are several enhancing masses in the central left breast mid to posterior depth and predominantly located lateral to the dominant mass (subtraction images 99 through 101), with these masses all together measuring 2.9 cm transverse and 2.7 cm craniocaudal. These are suspicious in appearance and not definitely related to normal background enhancement. An additional oval enhancing mass in the lower central left breast mid depth (subtraction image 106) measures 0.7 cm. Lymph  nodes: No morphologically abnormal axillary lymph nodes. No internal mammary lymphadenopathy seen. Ancillary findings:  None. IMPRESSION: 1. Biopsy proven malignancy in the lower inner left breast measures 2.7 x 1.9 x 1.2 cm. 2. 1.6 cm linear clumped non mass enhancement in the central upper left breast (subtraction image 83) 3. Multiple enhancing masses in the central left breast all together measuring 2.9 x 2.7 cm (subtraction images 99-101). 4. 0.7 cm enhancing mass in the lower central left breast (subtraction image 106). 5.  No MRI evidence of malignancy in the right breast. RECOMMENDATION: If breast conservation is a consideration, then suggest the patient return for targeted ultrasound to evaluate for a sonographic correlate (and subsequent biopsies) for the enhancing masses and clumped enhancement in the left breast. If these areas cannot be identified sonographically, then recommend MRI guided biopsy of at least 2 of these additional suspicious sites in the left breast. BI-RADS CATEGORY  4: Suspicious. Electronically Signed   By: Everlean Alstrom M.D.   On: 06/30/2019 08:58   ECHOCARDIOGRAM COMPLETE  Result Date: 06/28/2019    ECHOCARDIOGRAM REPORT   Patient Name:   Kathy Barker Date of Exam: 06/28/2019 Medical Rec #:  [id number]     Height:       62.5 in Accession #:    [id number]    Weight:       129.6 lb Date of Birth:  June 22, 1960      BSA:          1.599 m Patient Age:    69 years      BP:           146/80 mmHg Patient Gender: F             HR:           65 bpm. Exam Location:  Outpatient Procedure: 2D Echo, 3D Echo, Cardiac Doppler, Color Doppler and Strain Analysis Indications:    Z51.11 Encounter for antineoplastic chemotheraphy  History:  Patient has prior history of Echocardiogram examinations, most                 recent 10/07/2012. Risk Factors:Dyslipidemia and GERD.  Sonographer:    Jonelle Sidle Dance Referring Phys: [place]  1. Left ventricular ejection fraction, by  estimation, is 60 to 65%. The left ventricle has normal function. The left ventricle has no regional wall motion abnormalities. Left ventricular diastolic parameters are indeterminate. The average left ventricular global longitudinal strain is -16.5 %. The global longitudinal strain is normal.  2. Right ventricular systolic function is normal. The right ventricular size is normal. There is normal pulmonary artery systolic pressure.  3. The mitral valve is normal in structure. Mild mitral valve regurgitation. No evidence of mitral stenosis.  4. The aortic valve is normal in structure. Aortic valve regurgitation is not visualized. No aortic stenosis is present.  5. The inferior vena cava is normal in size with greater than 50% respiratory variability, suggesting right atrial pressure of 3 mmHg.  6. Compared to echo 2014, no significant change. FINDINGS  Left Ventricle: Left ventricular ejection fraction, by estimation, is 60 to 65%. The left ventricle has normal function. The left ventricle has no regional wall motion abnormalities. The average left ventricular global longitudinal strain is -16.5 %. The global longitudinal strain is normal. The left ventricular internal cavity size was normal in size. There is no left ventricular hypertrophy. Left ventricular diastolic parameters are indeterminate. Normal left ventricular filling pressure. Right Ventricle: The right ventricular size is normal. No increase in right ventricular wall thickness. Right ventricular systolic function is normal. There is normal pulmonary artery systolic pressure. The tricuspid regurgitant velocity is 2.12 m/s, and  with an assumed right atrial pressure of 3 mmHg, the estimated right ventricular systolic pressure is 16.1 mmHg. Left Atrium: Left atrial size was normal in size. Right Atrium: Right atrial size was normal in size. Pericardium: There is no evidence of pericardial effusion. Mitral Valve: The mitral valve is normal in structure.  Normal mobility of the mitral valve leaflets. Mild mitral valve regurgitation. No evidence of mitral valve stenosis. Tricuspid Valve: The tricuspid valve is normal in structure. Tricuspid valve regurgitation is not demonstrated. No evidence of tricuspid stenosis. Aortic Valve: The aortic valve is normal in structure. Aortic valve regurgitation is not visualized. No aortic stenosis is present. Pulmonic Valve: The pulmonic valve was normal in structure. Pulmonic valve regurgitation is not visualized. No evidence of pulmonic stenosis. Aorta: The aortic root is normal in size and structure. Venous: The inferior vena cava is normal in size with greater than 50% respiratory variability, suggesting right atrial pressure of 3 mmHg. IAS/Shunts: The interatrial septum appears to be lipomatous. No atrial level shunt detected by color flow Doppler.  LEFT VENTRICLE PLAX 2D LVIDd:         3.60 cm  Diastology LVIDs:         2.50 cm  LV e' lateral:   9.23 cm/s LV PW:         1.20 cm  LV E/e' lateral: 10.1 LV IVS:        1.00 cm  LV e' medial:    6.74 cm/s LVOT diam:     1.50 cm  LV E/e' medial:  13.8 LV SV:         33 LV SV Index:   21       2D Longitudinal Strain LVOT Area:     1.77 cm 2D Strain GLS (A2C):   -  17.2 %                         2D Strain GLS (A3C):   -13.8 %                         2D Strain GLS (A4C):   -18.5 %                         2D Strain GLS Avg:     -16.5 %                          3D Volume EF:                         3D EF:        59 %                         LV EDV:       106 ml                         LV ESV:       43 ml                         LV SV:        63 ml RIGHT VENTRICLE             IVC RV Basal diam:  2.10 cm     IVC diam: 1.30 cm RV S prime:     10.10 cm/s TAPSE (M-mode): 1.7 cm LEFT ATRIUM             Index       RIGHT ATRIUM          Index LA diam:        3.30 cm 2.06 cm/m  RA Area:     8.21 cm LA Vol (A2C):   32.2 ml 20.13 ml/m RA Volume:   15.00 ml 9.38 ml/m LA Vol (A4C):   30.7 ml  19.20 ml/m LA Biplane Vol: 32.3 ml 20.20 ml/m  AORTIC VALVE LVOT Vmax:   82.50 cm/s LVOT Vmean:  54.100 cm/s LVOT VTI:    0.188 m  AORTA Ao Root diam: 2.70 cm Ao Asc diam:  2.80 cm MITRAL VALVE               TRICUSPID VALVE MV Area (PHT): 4.06 cm    TR Peak grad:   18.0 mmHg MV Decel Time: 187 msec    TR Vmax:        212.00 cm/s MV E velocity: 93.40 cm/s MV A velocity: 65.60 cm/s  SHUNTS MV E/A ratio:  1.42        Systemic VTI:  0.19 m                            Systemic Diam: 1.50 cm Fransico Him MD Electronically signed by Fransico Him MD Signature Date/Time: 06/28/2019/1:25:36 PM    Final    IR IMAGING GUIDED PORT INSERTION  Result Date: 06/30/2019 INDICATION: 60 year old female with a history of breast carcinoma EXAM: IMAGE GUIDED PORT CATHETER PLACEMENT MEDICATIONS: 2 g Ancef; The antibiotic was administered within an appropriate time interval  prior to skin puncture. ANESTHESIA/SEDATION: Moderate (conscious) sedation was not employed during this procedure. 1 mg Dilaudid. 25 mg Benadryl Moderate Sedation Time: 0 minutes. The patient's level of consciousness and vital signs were monitored continuously by radiology nursing throughout the procedure under my direct supervision. FLUOROSCOPY TIME:  Fluoroscopy Time: 0 minutes 6 seconds (1 mGy). COMPLICATIONS: None PROCEDURE: The procedure, risks, benefits, and alternatives were explained to the patient. Questions regarding the procedure were encouraged and answered. The patient understands and consents to the procedure. Ultrasound survey was performed with images stored and sent to PACs. The right neck and chest was prepped with chlorhexidine, and draped in the usual sterile fashion using maximum barrier technique (cap and mask, sterile gown, sterile gloves, large sterile sheet, hand hygiene and cutaneous antiseptic). Antibiotic prophylaxis was provided with 2.0g Ancef administered IV one hour prior to skin incision. Local anesthesia was attained by infiltration  with 1% lidocaine without epinephrine. Ultrasound demonstrated patency of the right internal jugular vein, and this was documented with an image. Under real-time ultrasound guidance, this vein was accessed with a 21 gauge micropuncture needle and image documentation was performed. A small dermatotomy was made at the access site with an 11 scalpel. A 0.018" wire was advanced into the SVC and used to estimate the length of the internal catheter. The access needle exchanged for a 30F micropuncture vascular sheath. The 0.018" wire was then removed and a 0.035" wire advanced into the IVC. An appropriate location for the subcutaneous reservoir was selected below the clavicle and an incision was made through the skin and underlying soft tissues. The subcutaneous tissues were then dissected using a combination of blunt and sharp surgical technique and a pocket was formed. A single lumen power injectable portacatheter was then tunneled through the subcutaneous tissues from the pocket to the dermatotomy and the port reservoir placed within the subcutaneous pocket. The venous access site was then serially dilated and a peel away vascular sheath placed over the wire. The wire was removed and the port catheter advanced into position under fluoroscopic guidance. The catheter tip is positioned in the cavoatrial junction. This was documented with a spot image. The portacatheter was then tested and found to flush and aspirate well. The pocket was then closed in two layers using first subdermal inverted interrupted absorbable sutures followed by a running subcuticular suture. The epidermis was then sealed with Dermabond. The dermatotomy at the venous access site was also seal with Dermabond. Finally, the Arthur needle access was placed and the port was again flushed. Patient tolerated the procedure well and remained hemodynamically stable throughout. No complications encountered and no significant blood loss encountered IMPRESSION:  Status post right IJ port catheter placement. Signed, Dulcy Fanny. Dellia Nims, RPVI [facility] Electronically Signed   By: Corrie Mckusick D.O.   On: 06/30/2019 13:46     ELIGIBLE FOR AVAILABLE RESEARCH PROTOCOL: no  ASSESSMENT: 58 y.o. [place] woman status post left breast lower inner quadrant biopsy 06/01/2019 for a clinical T1c N0, stage IB invasive ductal carcinoma, grade 3, weakly estrogen receptor positive but functionally triple negative, with an MIB-1 of 80%.  (1) Oncotype score of 51 predicts a risk of recurrence outside the breast in the next 9 years of greater than 39% if the patient's only systemic therapy is antiestrogens for 5 years.  It also predicts a greater than 15% benefit from chemotherapy  (2) neoadjuvant chemotherapy will consist of doxorubicin and cyclophosphamide in dose dense fashion x4  beginning 06/30/2019, to be followed by weekly paclitaxel and carboplatin weekly x12  (3) definitive surgery pending  (4) adjuvant radiation to follow: Consider capecitabine sensitization  PLAN:  Marthe is doing well today.  She tolerated her first treatment with Doxorubicin and Cylophosphamide very well.  She had some fatigue, but minimal nausea, and no vomiting, which is great.    Josseline and I reviewed her MRI results in detail.  I reviewed with her that there is an increased false positive rate with MRIs.  I encouraged her to call Janett Billow at [facility] and gave her Jessica's direct number to go ahead and get the ultrasound guided biopsies scheduled.  She knows she needs to go ahead and get this completed so that she doesn't receive too much chemotherapy as to interfere with the results.  She understands this.    We reviewed that based on the results above, would guide discussions about surgery and whether or not breast conservation is possible.  She is open to the possibility of mastectomy.    Liliyana will return in 1 week for labs, f/u, and  her next treatment of neoadjuvant chemotherapy.  She knows to call for any questions that may arise between now and her next appointment.  We are happy to see her sooner if needed.   Total encounter time: 30 minutes*  Wilber Bihari, NP 07/09/19 5:11 AM Medical Oncology and Hematology [facility] [place], [place] [address] Tel. [phone]    Fax. [phone]  *Total Encounter Time as defined by the Centers for Medicare and Medicaid Services includes, in addition to the face-to-face time of a patient visit (documented in the note above) non-face-to-face time: obtaining and reviewing outside history, ordering and reviewing medications, tests or procedures, care coordination (communications with other health care professionals or caregivers) and documentation in the medical record.

## 2019-07-08 ENCOUNTER — Encounter: Payer: Self-pay | Admitting: *Deleted

## 2019-07-08 ENCOUNTER — Telehealth: Payer: Self-pay | Admitting: Adult Health

## 2019-07-08 NOTE — Telephone Encounter (Signed)
Scheduled appts per 6/24 los. Pt to get updated appt calendar at next appt per appt notes.

## 2019-07-09 ENCOUNTER — Encounter: Payer: Self-pay | Admitting: Adult Health

## 2019-07-11 ENCOUNTER — Other Ambulatory Visit: Payer: Self-pay | Admitting: *Deleted

## 2019-07-11 DIAGNOSIS — C50312 Malignant neoplasm of lower-inner quadrant of left female breast: Secondary | ICD-10-CM

## 2019-07-12 ENCOUNTER — Telehealth: Payer: Self-pay | Admitting: *Deleted

## 2019-07-12 ENCOUNTER — Encounter: Payer: Self-pay | Admitting: *Deleted

## 2019-07-12 NOTE — Telephone Encounter (Signed)
Spoke with patient and she is aware of her u/s biopsy on 07/13/19 at 8:45am at [facility].  She is a [facility] patient but due to their internet and phone lines down needed to get at [facility].  She also stated her back pain is all better now.

## 2019-07-13 ENCOUNTER — Other Ambulatory Visit: Payer: Self-pay | Admitting: Adult Health

## 2019-07-13 ENCOUNTER — Other Ambulatory Visit: Payer: Self-pay

## 2019-07-13 ENCOUNTER — Ambulatory Visit
Admission: RE | Admit: 2019-07-13 | Discharge: 2019-07-13 | Disposition: A | Discharge status: Home / Self Care | Payer: 59 | Source: Ambulatory Visit | Attending: Oncology | Admitting: Oncology

## 2019-07-13 DIAGNOSIS — C50312 Malignant neoplasm of lower-inner quadrant of left female breast: Secondary | ICD-10-CM

## 2019-07-13 DIAGNOSIS — Z170 Estrogen receptor positive status [ER+]: Secondary | ICD-10-CM

## 2019-07-14 ENCOUNTER — Other Ambulatory Visit: Payer: 59

## 2019-07-14 ENCOUNTER — Ambulatory Visit: Payer: 59

## 2019-07-14 ENCOUNTER — Ambulatory Visit: Payer: 59 | Admitting: Adult Health

## 2019-07-14 ENCOUNTER — Other Ambulatory Visit: Payer: Self-pay | Admitting: Adult Health

## 2019-07-14 DIAGNOSIS — C50312 Malignant neoplasm of lower-inner quadrant of left female breast: Secondary | ICD-10-CM

## 2019-07-15 ENCOUNTER — Other Ambulatory Visit: Payer: Self-pay | Admitting: *Deleted

## 2019-07-15 ENCOUNTER — Encounter: Payer: Self-pay | Admitting: *Deleted

## 2019-07-15 DIAGNOSIS — C50312 Malignant neoplasm of lower-inner quadrant of left female breast: Secondary | ICD-10-CM

## 2019-07-16 ENCOUNTER — Ambulatory Visit: Payer: 59

## 2019-07-18 NOTE — Progress Notes (Signed)
[place]  [phone] [phone]) [phone]     ID: Kathy Barker DOB: [birth year]  MR#: [id number]  FMB#:[id number]  Patient Care Team: [name], MD as PCP - General (Family Medicine) Rockwell Germany, RN as Oncology Nurse Navigator Mauro Kaufmann, RN as Oncology Nurse Navigator [name], MD as Consulting Physician (General Surgery) Brixton Schnapp, [name], MD as Consulting Physician (Oncology) [name], MD as Attending Physician (Radiation Oncology) Lavonna Monarch, MD as Consulting Physician (Dermatology) Chauncey Cruel, MD OTHER MD:  CHIEF COMPLAINT: Left-sided breast cancer  CURRENT TREATMENT: Neoadjuvant chemotherapy   INTERVAL HISTORY: Kathy Barker returns today for follow up and treatment of her left-sided breast cancer.   She was started on neoadjvuant chematoherapy with doxorubicin and cyclophosphamide given on day 1 of a 14 day schedule, with Udenyca support on day 3. Today is day 1 cycle 2.  Since her last visit, she underwent left breast ultrasound on 07/13/2019 to further evaluate findings from breast MRI on 06/29/2019. Ultrasound showed no sonographic correlate for MRI findings.   She is scheduled for two MRI-guided biopsies of the left breast on 07/22/2019 to assist in determining the extent of disease.   REVIEW OF SYSTEMS: Kathy Barker is having horrendous night sweats.  This is worse in menopause she says.  However she does not want to try gabapentin at this point.  She is having some scalp pain and she discovered that cold water or ice is extremely helpful for that.  She has started losing her hair.  She is planning to use Gareth Eagle had some not weeks.  She has developed some heartburn particularly in the days after chemotherapy.  Aside from these issues a detailed review of systems today was stable   HISTORY OF CURRENT ILLNESS: From the original intake note:  Kathy Barker herself noted a palpable lower-inner left breast lump and immediately  brought it to medical attention.. She underwent bilateral diagnostic mammography with tomography and left breast ultrasonography at [facility] on 05/30/2019 showing: breast density category B; 1.7 cm lobulated mass in left breast at 8 o'clock; no significant left axillary abnormalities.  Accordingly on 06/01/2019 she proceeded to biopsy of the left breast area in question. The pathology from this procedure ([id number]) showed: invasive ductal carcinoma, grade 3. Prognostic indicators significant for: estrogen receptor, 70% positive with weak staining intensity and progesterone receptor, 0% negative. Proliferation marker Ki67 at 80%. HER2 negative by immunohistochemistry (1+).  The patient's subsequent history is as detailed below.   PAST MEDICAL HISTORY: Past Medical History:  Diagnosis Date  . Allergic rhinitis   . Anxiety    "only during my divorce" (11/18/2012)  . Cancer ([place])   . Depression    "only during my divorce" (11/18/2012)  . GERD (gastroesophageal reflux disease)   . High cholesterol   . Hot flashes   . IBS (irritable bowel syndrome)   . Insomnia   . Migraines    "lately more often; before SVT I'd get them ~ q 6 months" (11/18/2012)  . PONV (postoperative nausea and vomiting)   . 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 CESAREAN SECTION  1992  . COMBINED HYSTERECTOMY VAGINAL / OOPHORECTOMY / A&P REPAIR  1999  . IR IMAGING GUIDED PORT INSERTION  06/30/2019  . 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  SUPRAVENTRICULAR TACHYCARDIA ABLATION N/A 11/18/2012   Procedure: SUPRAVENTRICULAR TACHYCARDIA ABLATION;  Surgeon: Coralyn Mark, MD;  Location: [facility] CATH [facility];  Service: Cardiovascular;  Laterality: N/A;  . VAGINAL HYSTERECTOMY  ~ 2000    FAMILY HISTORY: Family History  Problem  Relation Age of Onset  . Heart attack Father   . Alcohol abuse Father   . Heart attack Paternal Grandfather   . Heart Problems Other        both sides of family  . Breast cancer Mother    Her father died at age 24 from alcohol abuse. Her mother is living at age 38 (as of 05/2019) and has a history of DCIS at age 51 and invasive lobular breast cancer at age 1. Janelys has one brother. She reports cancer of an unknown type in her maternal grandmother and prostate cancer in a maternal uncle.   GYNECOLOGIC HISTORY:  No LMP recorded. Patient has had a hysterectomy. Menarche: 59 years old Age at first live birth: 59 years old [place] P 2 LMP 2000 Contraceptive: never used HRT never used  Hysterectomy? yes BSO? 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  She will be on temporary disability during her chemotherapy.  She is divorced. She lives at home with son [name], age 20, who works in Biomedical scientist in [place]. Son [name], age 80, works in Sales executive here in [place]. 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     ADVANCED DIRECTIVES: Not in place. She intends to name both of her sons as her 57.   HEALTH MAINTENANCE: Social History   Tobacco Use  . Smoking status: Never Smoker  . Smokeless tobacco: Never Used  Vaping Use  . Vaping Use: Never used  Substance Use Topics  . Alcohol use: Yes    Comment: 11/18/2012 "shot of brandy couple times/month"  . Drug use: No     Colonoscopy: yes, date unsure  PAP: date unsure  Bone density: never done   Allergies  Allergen Reactions  . Fentanyl Nausea And Vomiting    Per patient " extreme nausea and vomiting "   . Other     Problems with stitches  . Versed [Midazolam] Nausea And Vomiting    Per patient " extreme N/V"   . Latex Rash    Current Outpatient Medications  Medication Sig Dispense Refill  . cetirizine (ZYRTEC) 10 MG tablet Take 10 mg by mouth daily.      [name] dexamethasone (DECADRON) 4 MG tablet Take 2 tablets by mouth twice a day starting on the day after chemotherapy (day 2), repeat the next day (day 3) then take 2 tablets in AM only day 4, then stop.  Take with food. 30 tablet 1  . lidocaine-prilocaine (EMLA) cream Apply to affected area once 30 g 3  . LIVALO 4 MG TABS Take 1 tablet by mouth daily.    [name] loratadine (CLARITIN) 10 MG tablet Take 1 tablet (10 mg total) by mouth daily. 60 tablet 3  . LORazepam (ATIVAN) 0.5 MG tablet Use 1 tab daily as needed for anxiety and or nausea. 30 tablet 0  . omeprazole (PRILOSEC) 40 MG capsule Take one capsule daily for 5 days starting on chemo day, then daily as needed 60 capsule 3  . prochlorperazine (COMPAZINE) 10 MG tablet Take 1 tablet (10 mg total) by mouth every 6 (six) hours as needed (Nausea or vomiting). 30 tablet 1  . sertraline (ZOLOFT)  100 MG tablet Take 100 mg by mouth daily.    . SYMBICORT 160-4.5 MCG/ACT inhaler Inhale 2 puffs into the lungs 2 (two) times daily.    . temazepam (RESTORIL) 15 MG capsule Take 15 mg by mouth at bedtime.     No current facility-administered medications for this visit.    OBJECTIVE: White woman in no acute distress  Vitals:   07/19/19 0940  BP: 121/69  Pulse: 97  Resp: 18  Temp: 98.2 F (36.8 C)  SpO2: 98%     Body mass index is 23.58 kg/m.   Wt Readings from Last 3 Encounters:  07/19/19 131 lb (59.4 kg)  07/07/19 128 lb 8 oz (58.3 kg)  06/28/19 128 lb 6.4 oz (58.2 kg)      ECOG FS:1 - Symptomatic but completely ambulatory  Sclerae unicteric, EOMs intact Wearing a mask No cervical or supraclavicular adenopathy Lungs no rales or rhonchi Heart regular rate and rhythm Abd soft, nontender, positive bowel sounds MSK no focal spinal tenderness, no upper extremity lymphedema Neuro: nonfocal, well oriented, appropriate affect Breasts: The mass in the lower inner quadrant of the left breast is minimally palpable, perhaps 7 mm, still a bit firm.  Both  axillae are benign.   LAB RESULTS:  CMP     Component Value Date/Time   NA 139 07/07/2019 1200   K 4.5 07/07/2019 1200   CL 105 07/07/2019 1200   CO2 23 07/07/2019 1200   GLUCOSE 106 (H) 07/07/2019 1200   BUN 7 07/07/2019 1200   CREATININE 0.71 07/07/2019 1200   CREATININE 0.79 06/08/2019 0831   CALCIUM 9.8 07/07/2019 1200   PROT 6.7 07/07/2019 1200   ALBUMIN 4.0 07/07/2019 1200   AST 8 (L) 07/07/2019 1200   AST 17 06/08/2019 0831   ALT 15 07/07/2019 1200   ALT 23 06/08/2019 0831   ALKPHOS 99 07/07/2019 1200   BILITOT 0.4 07/07/2019 1200   BILITOT 0.3 06/08/2019 0831   GFRNONAA >60 07/07/2019 1200   GFRNONAA >60 06/08/2019 0831   GFRAA >60 07/07/2019 1200   GFRAA >60 06/08/2019 0831    No results found for: TOTALPROTELP, ALBUMINELP, A1GS, A2GS, BETS, BETA2SER, GAMS, MSPIKE, SPEI  Lab Results  Component Value Date   WBC 10.4 07/19/2019   NEUTROABS 7.8 (H) 07/19/2019   HGB 12.5 07/19/2019   HCT 37.2 07/19/2019   MCV 94.9 07/19/2019   PLT 268 07/19/2019    No results found for: LABCA2  No components found for: [id number]  No results for input(s): INR in the last 168 hours.  No results found for: LABCA2  No results found for: RSW546  No results found for: EVO350  No results found for: KXF818  No results found for: CA2729  No components found for: HGQUANT  No results found for: CEA1 / No results found for: CEA1   No results found for: AFPTUMOR  No results found for: CHROMOGRNA  No results found for: KPAFRELGTCHN, LAMBDASER, KAPLAMBRATIO (kappa/lambda light chains)  No results found for: HGBA, HGBA2QUANT, HGBFQUANT, HGBSQUAN (Hemoglobinopathy evaluation)   No results found for: LDH  No results found for: IRON, TIBC, IRONPCTSAT (Iron and TIBC)  No results found for: FERRITIN  Urinalysis No results found for: COLORURINE, APPEARANCEUR, LABSPEC, PHURINE, GLUCOSEU, HGBUR, BILIRUBINUR, KETONESUR, PROTEINUR, UROBILINOGEN, NITRITE,  LEUKOCYTESUR   STUDIES: MR BREAST BILATERAL W WO CONTRAST INC CAD  Result Date: 06/30/2019 CLINICAL DATA:  59 year old female with recently diagnosed grade 3 invasive ductal carcinoma of the left breast post ultrasound-guided biopsy of  a 1.7 cm mass at the 8 o'clock position 6 cm from nipple. Family history of breast cancer with patient's mother having been diagnosed with breast cancer. LABS:  Not applicable. EXAM: BILATERAL BREAST MRI WITH AND WITHOUT CONTRAST TECHNIQUE: Multiplanar, multisequence MR images of both breasts were obtained prior to and following the intravenous administration of 6 ml of Gadavist Three-dimensional MR images were rendered by post-processing of the original MR data on an independent workstation. The three-dimensional MR images were interpreted, and findings are reported in the following complete MRI report for this study. Three dimensional images were evaluated at the independent DynaCad workstation COMPARISON:  Previous exams. FINDINGS: Breast composition: b.  Scattered fibroglandular tissue. Background parenchymal enhancement: There is mild to moderate background parenchymal enhancement with multiple scattered enhancing foci in each breast which are considered benign given multiplicity and bilaterality. Right breast: No mass or abnormal enhancement. Left breast: Irregular enhancing mass in the lower inner left breast measures 2.7 cm AP, 1.9 cm transverse and 1.2 cm craniocaudal. Biopsy marking clip artifact is present in the superior portion of this mass. There is linear oriented clumped non mass enhancement within the upper central left breast mid depth (subtraction image 83) measuring 1.6 cm. There are several enhancing masses in the central left breast mid to posterior depth and predominantly located lateral to the dominant mass (subtraction images 99 through 101), with these masses all together measuring 2.9 cm transverse and 2.7 cm craniocaudal. These are suspicious in  appearance and not definitely related to normal background enhancement. An additional oval enhancing mass in the lower central left breast mid depth (subtraction image 106) measures 0.7 cm. Lymph nodes: No morphologically abnormal axillary lymph nodes. No internal mammary lymphadenopathy seen. Ancillary findings:  None. IMPRESSION: 1. Biopsy proven malignancy in the lower inner left breast measures 2.7 x 1.9 x 1.2 cm. 2. 1.6 cm linear clumped non mass enhancement in the central upper left breast (subtraction image 83) 3. Multiple enhancing masses in the central left breast all together measuring 2.9 x 2.7 cm (subtraction images 99-101). 4. 0.7 cm enhancing mass in the lower central left breast (subtraction image 106). 5.  No MRI evidence of malignancy in the right breast. RECOMMENDATION: If breast conservation is a consideration, then suggest the patient return for targeted ultrasound to evaluate for a sonographic correlate (and subsequent biopsies) for the enhancing masses and clumped enhancement in the left breast. If these areas cannot be identified sonographically, then recommend MRI guided biopsy of at least 2 of these additional suspicious sites in the left breast. BI-RADS CATEGORY  4: Suspicious. Electronically Signed   By: Everlean Alstrom M.D.   On: 06/30/2019 08:58   US BREAST LTD UNI LEFT INC AXILLA  Result Date: 07/13/2019 CLINICAL DATA:  59 year old with recent diagnosis of grade 3 invasive ductal carcinoma involving the LOWER INNER QUADRANT of the LEFT breast (ER positive, PR negative, HER 2 Neu negative, Ki67 80%). The patient has had 1 dose of neoadjuvant chemotherapy. Pretreatment MRI demonstrated potential clustered masses immediately adjacent and LATERAL to the biopsy-proven malignancy, a 7 mm mass in the LOWER breast near 6 o'clock location, and linear non-mass enhancement in the UPPER breast at MIDDLE depth. Second-look ultrasound is performed to determine if there is a potential sonographic  correlate for the masses in the LOWER breast. EXAM: ULTRASOUND OF THE LEFT BREAST COMPARISON:  MRI breast 06/28/2025. The prior LEFT breast ultrasound 05/30/2019 is compared, but was performed only in the [facility]. FINDINGS: Targeted ultrasound  is performed, showing no sonographic correlate for the MRI findings in the LOWER breast. Specifically, the discrete 7 mm mass at the near 6 o'clock location was not identified. I documented the mass in the [place] with the associated biopsy marker clip. IMPRESSION: No sonographic correlate for the MRI findings in the LEFT breast. RECOMMENDATION: MRI biopsy of the linear non-mass enhancement in the UPPER LEFT breast and the 7 mm nodule in the LOWER LEFT breast which will assist in determining the extent of disease (as these 2 areas are the farthest apart). I have discussed the findings and recommendations with the patient. The patient will be contacted in order to schedule the MRI biopsy. BI-RADS CATEGORY  6: Known biopsy-proven malignancy. Electronically Signed   By: Evangeline Dakin M.D.   On: 07/13/2019 09:49   ECHOCARDIOGRAM COMPLETE  Result Date: 06/28/2019    ECHOCARDIOGRAM REPORT   Patient Name:   JINNIE ONLEY Date of Exam: 06/28/2019 Medical Rec #:  [id number]     Height:       62.5 in Accession #:    [id number]    Weight:       129.6 lb Date of Birth:  22-Sep-1960      BSA:          1.599 m Patient Age:    59 years      BP:           146/80 mmHg Patient Gender: F             HR:           65 bpm. Exam Location:  Outpatient Procedure: 2D Echo, 3D Echo, Cardiac Doppler, Color Doppler and Strain Analysis Indications:    Z51.11 Encounter for antineoplastic chemotheraphy  History:        Patient has prior history of Echocardiogram examinations, most                 recent 10/07/2012. Risk Factors:Dyslipidemia and GERD.  Sonographer:    Jonelle Sidle Dance Referring Phys: [place]  1. Left ventricular ejection fraction, by  estimation, is 60 to 65%. The left ventricle has normal function. The left ventricle has no regional wall motion abnormalities. Left ventricular diastolic parameters are indeterminate. The average left ventricular global longitudinal strain is -16.5 %. The global longitudinal strain is normal.  2. Right ventricular systolic function is normal. The right ventricular size is normal. There is normal pulmonary artery systolic pressure.  3. The mitral valve is normal in structure. Mild mitral valve regurgitation. No evidence of mitral stenosis.  4. The aortic valve is normal in structure. Aortic valve regurgitation is not visualized. No aortic stenosis is present.  5. The inferior vena cava is normal in size with greater than 50% respiratory variability, suggesting right atrial pressure of 3 mmHg.  6. Compared to echo 2014, no significant change. FINDINGS  Left Ventricle: Left ventricular ejection fraction, by estimation, is 60 to 65%. The left ventricle has normal function. The left ventricle has no regional wall motion abnormalities. The average left ventricular global longitudinal strain is -16.5 %. The global longitudinal strain is normal. The left ventricular internal cavity size was normal in size. There is no left ventricular hypertrophy. Left ventricular diastolic parameters are indeterminate. Normal left ventricular filling pressure. Right Ventricle: The right ventricular size is normal. No increase in right ventricular wall thickness. Right ventricular systolic function is normal. There is normal pulmonary artery systolic pressure. The tricuspid regurgitant velocity is  2.12 m/s, and  with an assumed right atrial pressure of 3 mmHg, the estimated right ventricular systolic pressure is 42.3 mmHg. Left Atrium: Left atrial size was normal in size. Right Atrium: Right atrial size was normal in size. Pericardium: There is no evidence of pericardial effusion. Mitral Valve: The mitral valve is normal in structure.  Normal mobility of the mitral valve leaflets. Mild mitral valve regurgitation. No evidence of mitral valve stenosis. Tricuspid Valve: The tricuspid valve is normal in structure. Tricuspid valve regurgitation is not demonstrated. No evidence of tricuspid stenosis. Aortic Valve: The aortic valve is normal in structure. Aortic valve regurgitation is not visualized. No aortic stenosis is present. Pulmonic Valve: The pulmonic valve was normal in structure. Pulmonic valve regurgitation is not visualized. No evidence of pulmonic stenosis. Aorta: The aortic root is normal in size and structure. Venous: The inferior vena cava is normal in size with greater than 50% respiratory variability, suggesting right atrial pressure of 3 mmHg. IAS/Shunts: The interatrial septum appears to be lipomatous. No atrial level shunt detected by color flow Doppler.  LEFT VENTRICLE PLAX 2D LVIDd:         3.60 cm  Diastology LVIDs:         2.50 cm  LV e' lateral:   9.23 cm/s LV PW:         1.20 cm  LV E/e' lateral: 10.1 LV IVS:        1.00 cm  LV e' medial:    6.74 cm/s LVOT diam:     1.50 cm  LV E/e' medial:  13.8 LV SV:         33 LV SV Index:   21       2D Longitudinal Strain LVOT Area:     1.77 cm 2D Strain GLS (A2C):   -17.2 %                         2D Strain GLS (A3C):   -13.8 %                         2D Strain GLS (A4C):   -18.5 %                         2D Strain GLS Avg:     -16.5 %                          3D Volume EF:                         3D EF:        59 %                         LV EDV:       106 ml                         LV ESV:       43 ml                         LV SV:        63 ml RIGHT VENTRICLE             IVC RV Basal diam:  2.10 cm  IVC diam: 1.30 cm RV S prime:     10.10 cm/s TAPSE (M-mode): 1.7 cm LEFT ATRIUM             Index       RIGHT ATRIUM          Index LA diam:        3.30 cm 2.06 cm/m  RA Area:     8.21 cm LA Vol (A2C):   32.2 ml 20.13 ml/m RA Volume:   15.00 ml 9.38 ml/m LA Vol (A4C):   30.7 ml  19.20 ml/m LA Biplane Vol: 32.3 ml 20.20 ml/m  AORTIC VALVE LVOT Vmax:   82.50 cm/s LVOT Vmean:  54.100 cm/s LVOT VTI:    0.188 m  AORTA Ao Root diam: 2.70 cm Ao Asc diam:  2.80 cm MITRAL VALVE               TRICUSPID VALVE MV Area (PHT): 4.06 cm    TR Peak grad:   18.0 mmHg MV Decel Time: 187 msec    TR Vmax:        212.00 cm/s MV E velocity: 93.40 cm/s MV A velocity: 65.60 cm/s  SHUNTS MV E/A ratio:  1.42        Systemic VTI:  0.19 m                            Systemic Diam: 1.50 cm Fransico Him MD Electronically signed by Fransico Him MD Signature Date/Time: 06/28/2019/1:25:36 PM    Final    IR IMAGING GUIDED PORT INSERTION  Result Date: 06/30/2019 INDICATION: 59 year old female with a history of breast carcinoma EXAM: IMAGE GUIDED PORT CATHETER PLACEMENT MEDICATIONS: 2 g Ancef; The antibiotic was administered within an appropriate time interval prior to skin puncture. ANESTHESIA/SEDATION: Moderate (conscious) sedation was not employed during this procedure. 1 mg Dilaudid. 25 mg Benadryl Moderate Sedation Time: 0 minutes. The patient's level of consciousness and vital signs were monitored continuously by radiology nursing throughout the procedure under my direct supervision. FLUOROSCOPY TIME:  Fluoroscopy Time: 0 minutes 6 seconds (1 mGy). COMPLICATIONS: None PROCEDURE: The procedure, risks, benefits, and alternatives were explained to the patient. Questions regarding the procedure were encouraged and answered. The patient understands and consents to the procedure. Ultrasound survey was performed with images stored and sent to PACs. The right neck and chest was prepped with chlorhexidine, and draped in the usual sterile fashion using maximum barrier technique (cap and mask, sterile gown, sterile gloves, large sterile sheet, hand hygiene and cutaneous antiseptic). Antibiotic prophylaxis was provided with 2.0g Ancef administered IV one hour prior to skin incision. Local anesthesia was attained by infiltration  with 1% lidocaine without epinephrine. Ultrasound demonstrated patency of the right internal jugular vein, and this was documented with an image. Under real-time ultrasound guidance, this vein was accessed with a 21 gauge micropuncture needle and image documentation was performed. A small dermatotomy was made at the access site with an 11 scalpel. A 0.018" wire was advanced into the SVC and used to estimate the length of the internal catheter. The access needle exchanged for a 55F micropuncture vascular sheath. The 0.018" wire was then removed and a 0.035" wire advanced into the IVC. An appropriate location for the subcutaneous reservoir was selected below the clavicle and an incision was made through the skin and underlying soft tissues. The subcutaneous tissues were then dissected using a combination of blunt and sharp surgical technique and a  pocket was formed. A single lumen power injectable portacatheter was then tunneled through the subcutaneous tissues from the pocket to the dermatotomy and the port reservoir placed within the subcutaneous pocket. The venous access site was then serially dilated and a peel away vascular sheath placed over the wire. The wire was removed and the port catheter advanced into position under fluoroscopic guidance. The catheter tip is positioned in the cavoatrial junction. This was documented with a spot image. The portacatheter was then tested and found to flush and aspirate well. The pocket was then closed in two layers using first subdermal inverted interrupted absorbable sutures followed by a running subcuticular suture. The epidermis was then sealed with Dermabond. The dermatotomy at the venous access site was also seal with Dermabond. Finally, the [place] needle access was placed and the port was again flushed. Patient tolerated the procedure well and remained hemodynamically stable throughout. No complications encountered and no significant blood loss encountered IMPRESSION:  Status post right IJ port catheter placement. Signed, Dulcy Fanny. Dellia Nims, RPVI [facility] Radiology Electronically Signed   By: Corrie Mckusick D.O.   On: 06/30/2019 13:46     ELIGIBLE FOR AVAILABLE RESEARCH PROTOCOL: no  ASSESSMENT: 59 y.o.  woman status post left breast lower inner quadrant biopsy 06/01/2019 for a clinical T1c N0, stage IB invasive ductal carcinoma, grade 3, weakly estrogen receptor positive but functionally triple negative, with an MIB-1 of 80%.  (1) Oncotype score of 51 predicts a risk of recurrence outside the breast in the next 9 years of greater than 39% if the patient's only systemic therapy is antiestrogens for 5 years.  It also predicts a greater than 15% benefit from chemotherapy  (2) neoadjuvant chemotherapy will consist of doxorubicin and cyclophosphamide in dose dense fashion x4 beginning 06/30/2019, to be followed by weekly paclitaxel and carboplatin weekly x12  (3) definitive surgery pending  (4) adjuvant radiation to follow: Consider capecitabine sensitization   PLAN: Tecla did remarkably well with her first cycle of chemotherapy.  I am not making any changes in her doses or schedule.  I am adding omeprazole for her to take daily beginning on day 1 of chemo and continuing for 5 days after which she may take it as needed.  I think that will make it significant improvement to her tolerance.  I encouraged her excellent exercise program  She understands she will lose her hair over the next few days.  She expects that.  It is always however a shock so we will discuss that further at the next visit  Otherwise we are proceeding with cycle 2 today.  She knows to call for any other issue that may develop before the next visit  Total encounter time 20 minutes.Sarajane Jews C. Cornisha Zetino, MD 07/19/19 10:05 AM Medical Oncology and Hematology [facility] [place], [place]  [address] Tel. [phone]    Fax. [phone]   I, Wilburn Mylar, am acting as scribe for Dr. [name]. Cyniah Gossard.  I, Lurline Del MD, have reviewed the above documentation for accuracy and completeness, and I agree with the above.    *Total Encounter Time as defined by the Centers for Medicare and Medicaid Services includes, in addition to the face-to-face time of a patient visit (documented in the note above) non-face-to-face time: obtaining and reviewing outside history, ordering and reviewing medications, tests or procedures, care coordination (communications with other health care professionals or caregivers) and documentation in the medical  record.

## 2019-07-19 ENCOUNTER — Inpatient Hospital Stay: Payer: 59 | Attending: Oncology

## 2019-07-19 ENCOUNTER — Other Ambulatory Visit: Payer: Self-pay

## 2019-07-19 ENCOUNTER — Inpatient Hospital Stay (HOSPITAL_BASED_OUTPATIENT_CLINIC_OR_DEPARTMENT_OTHER): Payer: 59 | Admitting: Oncology

## 2019-07-19 ENCOUNTER — Inpatient Hospital Stay: Payer: 59

## 2019-07-19 ENCOUNTER — Other Ambulatory Visit: Payer: Managed Care, Other (non HMO)

## 2019-07-19 VITALS — BP 121/69 | HR 97 | Temp 98.2°F | Resp 18 | Ht 62.5 in | Wt 131.0 lb

## 2019-07-19 DIAGNOSIS — Z170 Estrogen receptor positive status [ER+]: Secondary | ICD-10-CM | POA: Diagnosis not present

## 2019-07-19 DIAGNOSIS — R531 Weakness: Secondary | ICD-10-CM | POA: Insufficient documentation

## 2019-07-19 DIAGNOSIS — I340 Nonrheumatic mitral (valve) insufficiency: Secondary | ICD-10-CM | POA: Diagnosis not present

## 2019-07-19 DIAGNOSIS — C50312 Malignant neoplasm of lower-inner quadrant of left female breast: Secondary | ICD-10-CM | POA: Diagnosis not present

## 2019-07-19 DIAGNOSIS — Z5111 Encounter for antineoplastic chemotherapy: Secondary | ICD-10-CM | POA: Insufficient documentation

## 2019-07-19 DIAGNOSIS — Z5189 Encounter for other specified aftercare: Secondary | ICD-10-CM | POA: Insufficient documentation

## 2019-07-19 DIAGNOSIS — R232 Flushing: Secondary | ICD-10-CM | POA: Insufficient documentation

## 2019-07-19 DIAGNOSIS — Z811 Family history of alcohol abuse and dependence: Secondary | ICD-10-CM | POA: Insufficient documentation

## 2019-07-19 DIAGNOSIS — Z79899 Other long term (current) drug therapy: Secondary | ICD-10-CM | POA: Diagnosis not present

## 2019-07-19 DIAGNOSIS — R5383 Other fatigue: Secondary | ICD-10-CM | POA: Insufficient documentation

## 2019-07-19 DIAGNOSIS — Z885 Allergy status to narcotic agent status: Secondary | ICD-10-CM | POA: Insufficient documentation

## 2019-07-19 DIAGNOSIS — Z8249 Family history of ischemic heart disease and other diseases of the circulatory system: Secondary | ICD-10-CM | POA: Diagnosis not present

## 2019-07-19 DIAGNOSIS — Z171 Estrogen receptor negative status [ER-]: Secondary | ICD-10-CM | POA: Insufficient documentation

## 2019-07-19 DIAGNOSIS — Z90721 Acquired absence of ovaries, unilateral: Secondary | ICD-10-CM | POA: Insufficient documentation

## 2019-07-19 DIAGNOSIS — Z803 Family history of malignant neoplasm of breast: Secondary | ICD-10-CM | POA: Diagnosis not present

## 2019-07-19 LAB — CMP (CANCER CENTER ONLY)
ALT: 20 U/L (ref 0–44)
AST: 13 U/L — ABNORMAL LOW (ref 15–41)
Albumin: 4 g/dL (ref 3.5–5.0)
Alkaline Phosphatase: 103 U/L (ref 38–126)
Anion gap: 11 (ref 5–15)
BUN: 8 mg/dL (ref 6–20)
CO2: 21 mmol/L — ABNORMAL LOW (ref 22–32)
Calcium: 9.4 mg/dL (ref 8.9–10.3)
Chloride: 108 mmol/L (ref 98–111)
Creatinine: 0.78 mg/dL (ref 0.44–1.00)
GFR, Est AFR Am: >60 mL/min (ref >60)
GFR, Estimated: >60 mL/min (ref >60)
Glucose, Bld: 170 mg/dL — ABNORMAL HIGH (ref 70–99)
Potassium: 3.8 mmol/L (ref 3.5–5.1)
Sodium: 140 mmol/L (ref 135–145)
Total Bilirubin: <0.2 mg/dL — ABNORMAL LOW (ref 0.3–1.2)
Total Protein: 6.6 g/dL (ref 6.5–8.1)

## 2019-07-19 LAB — CBC WITH DIFFERENTIAL (CANCER CENTER ONLY)
Abs Immature Granulocytes: 0.14 10*3/uL — ABNORMAL HIGH (ref 0.00–0.07)
Basophils Absolute: 0.1 10*3/uL (ref 0.0–0.1)
Basophils Relative: 1 %
Eosinophils Absolute: 0 10*3/uL (ref 0.0–0.5)
Eosinophils Relative: 0 %
HCT: 37.2 % (ref 36.0–46.0)
Hemoglobin: 12.5 g/dL (ref 12.0–15.0)
Immature Granulocytes: 1 %
Lymphocytes Relative: 18 %
Lymphs Abs: 1.9 10*3/uL (ref 0.7–4.0)
MCH: 31.9 pg (ref 26.0–34.0)
MCHC: 33.6 g/dL (ref 30.0–36.0)
MCV: 94.9 fL (ref 80.0–100.0)
Monocytes Absolute: 0.5 10*3/uL (ref 0.1–1.0)
Monocytes Relative: 5 %
Neutro Abs: 7.8 10*3/uL — ABNORMAL HIGH (ref 1.7–7.7)
Neutrophils Relative %: 75 %
Platelet Count: 268 10*3/uL (ref 150–400)
RBC: 3.92 MIL/uL (ref 3.87–5.11)
RDW: 12.7 % (ref 11.5–15.5)
WBC Count: 10.4 10*3/uL (ref 4.0–10.5)
nRBC: 0 % (ref 0.0–0.2)

## 2019-07-19 MED ORDER — PALONOSETRON HCL INJECTION 0.25 MG/5ML
INTRAVENOUS | Status: AC
  Filled 2019-07-19: qty 5

## 2019-07-19 MED ORDER — PEGFILGRASTIM 6 MG/0.6ML ~~LOC~~ PSKT
PREFILLED_SYRINGE | SUBCUTANEOUS | Status: AC
  Filled 2019-07-19: qty 0.6

## 2019-07-19 MED ORDER — OMEPRAZOLE 40 MG PO CPDR
DELAYED_RELEASE_CAPSULE | ORAL | 3 refills | Status: DC
Start: 2019-07-19 — End: 2020-06-13

## 2019-07-19 MED ORDER — HEPARIN SOD (PORK) LOCK FLUSH 100 UNIT/ML IV SOLN
500.0000 [IU] | Freq: Once | INTRAVENOUS | Status: AC | PRN
  Administered 2019-07-19: 500 [IU]
  Filled 2019-07-19: qty 5

## 2019-07-19 MED ORDER — SODIUM CHLORIDE 0.9 % IV SOLN
10.0000 mg | Freq: Once | INTRAVENOUS | Status: AC
  Administered 2019-07-19: 10 mg via INTRAVENOUS
  Filled 2019-07-19: qty 10

## 2019-07-19 MED ORDER — PALONOSETRON HCL INJECTION 0.25 MG/5ML
0.2500 mg | Freq: Once | INTRAVENOUS | Status: AC
  Administered 2019-07-19: 0.25 mg via INTRAVENOUS

## 2019-07-19 MED ORDER — SODIUM CHLORIDE 0.9 % IV SOLN
150.0000 mg | Freq: Once | INTRAVENOUS | Status: AC
  Administered 2019-07-19: 150 mg via INTRAVENOUS
  Filled 2019-07-19: qty 150

## 2019-07-19 MED ORDER — SODIUM CHLORIDE 0.9 % IV SOLN
600.0000 mg/m2 | Freq: Once | INTRAVENOUS | Status: AC
  Administered 2019-07-19: 960 mg via INTRAVENOUS
  Filled 2019-07-19: qty 48

## 2019-07-19 MED ORDER — SODIUM CHLORIDE 0.9% FLUSH
10.0000 mL | INTRAVENOUS | Status: DC | PRN
  Administered 2019-07-19: 10 mL
  Filled 2019-07-19: qty 10

## 2019-07-19 MED ORDER — SODIUM CHLORIDE 0.9 % IV SOLN
Freq: Once | INTRAVENOUS | Status: AC
  Filled 2019-07-19: qty 250

## 2019-07-19 MED ORDER — PEGFILGRASTIM 6 MG/0.6ML ~~LOC~~ PSKT
6.0000 mg | PREFILLED_SYRINGE | Freq: Once | SUBCUTANEOUS | Status: AC
  Administered 2019-07-19: 6 mg via SUBCUTANEOUS

## 2019-07-19 MED ORDER — DOXORUBICIN HCL CHEMO IV INJECTION 2 MG/ML
60.0000 mg/m2 | Freq: Once | INTRAVENOUS | Status: AC
  Administered 2019-07-19: 96 mg via INTRAVENOUS
  Filled 2019-07-19: qty 48

## 2019-07-20 ENCOUNTER — Encounter: Payer: Self-pay | Admitting: *Deleted

## 2019-07-20 ENCOUNTER — Telehealth: Payer: Self-pay | Admitting: Oncology

## 2019-07-20 NOTE — Telephone Encounter (Signed)
No 7/6 los. No changes made to pt's schedule.

## 2019-07-21 ENCOUNTER — Ambulatory Visit: Payer: 59

## 2019-07-21 ENCOUNTER — Ambulatory Visit: Payer: Managed Care, Other (non HMO)

## 2019-07-21 ENCOUNTER — Encounter: Payer: Self-pay | Admitting: *Deleted

## 2019-07-22 ENCOUNTER — Other Ambulatory Visit (HOSPITAL_COMMUNITY): Payer: Self-pay | Admitting: Diagnostic Radiology

## 2019-07-22 ENCOUNTER — Ambulatory Visit
Admission: RE | Admit: 2019-07-22 | Discharge: 2019-07-22 | Disposition: A | Discharge status: Home / Self Care | Payer: 59 | Source: Ambulatory Visit | Attending: Adult Health | Admitting: Adult Health

## 2019-07-22 ENCOUNTER — Other Ambulatory Visit: Payer: Self-pay

## 2019-07-22 DIAGNOSIS — C50312 Malignant neoplasm of lower-inner quadrant of left female breast: Secondary | ICD-10-CM

## 2019-07-22 MED ORDER — GADOBUTROL 1 MMOL/ML IV SOLN
7.0000 mL | Freq: Once | INTRAVENOUS | Status: AC | PRN
  Administered 2019-07-22: 7 mL via INTRAVENOUS

## 2019-07-25 ENCOUNTER — Other Ambulatory Visit: Payer: Self-pay | Admitting: *Deleted

## 2019-07-25 ENCOUNTER — Encounter: Payer: Self-pay | Admitting: *Deleted

## 2019-07-25 DIAGNOSIS — C50312 Malignant neoplasm of lower-inner quadrant of left female breast: Secondary | ICD-10-CM

## 2019-07-26 ENCOUNTER — Inpatient Hospital Stay: Payer: 59

## 2019-07-26 ENCOUNTER — Encounter: Payer: Self-pay | Admitting: Adult Health

## 2019-07-26 ENCOUNTER — Other Ambulatory Visit: Payer: Self-pay

## 2019-07-26 ENCOUNTER — Inpatient Hospital Stay (HOSPITAL_BASED_OUTPATIENT_CLINIC_OR_DEPARTMENT_OTHER): Payer: 59 | Admitting: Adult Health

## 2019-07-26 VITALS — BP 129/65 | HR 100 | Temp 98.7°F | Resp 18 | Ht 62.5 in | Wt 131.6 lb

## 2019-07-26 DIAGNOSIS — C50312 Malignant neoplasm of lower-inner quadrant of left female breast: Secondary | ICD-10-CM

## 2019-07-26 DIAGNOSIS — Z170 Estrogen receptor positive status [ER+]: Secondary | ICD-10-CM

## 2019-07-26 DIAGNOSIS — Z5111 Encounter for antineoplastic chemotherapy: Secondary | ICD-10-CM | POA: Diagnosis not present

## 2019-07-26 DIAGNOSIS — Z95828 Presence of other vascular implants and grafts: Secondary | ICD-10-CM

## 2019-07-26 LAB — CMP (CANCER CENTER ONLY)
ALT: 19 U/L (ref 0–44)
AST: 9 U/L — ABNORMAL LOW (ref 15–41)
Albumin: 3.7 g/dL (ref 3.5–5.0)
Alkaline Phosphatase: 97 U/L (ref 38–126)
Anion gap: 8 (ref 5–15)
BUN: 8 mg/dL (ref 6–20)
CO2: 25 mmol/L (ref 22–32)
Calcium: 9.2 mg/dL (ref 8.9–10.3)
Chloride: 108 mmol/L (ref 98–111)
Creatinine: 0.67 mg/dL (ref 0.44–1.00)
GFR, Est AFR Am: >60 mL/min (ref >60)
GFR, Estimated: >60 mL/min (ref >60)
Glucose, Bld: 104 mg/dL — ABNORMAL HIGH (ref 70–99)
Potassium: 3.7 mmol/L (ref 3.5–5.1)
Sodium: 141 mmol/L (ref 135–145)
Total Bilirubin: 0.4 mg/dL (ref 0.3–1.2)
Total Protein: 6.3 g/dL — ABNORMAL LOW (ref 6.5–8.1)

## 2019-07-26 LAB — CBC WITH DIFFERENTIAL (CANCER CENTER ONLY)
Abs Immature Granulocytes: 0.03 10*3/uL (ref 0.00–0.07)
Basophils Absolute: 0 10*3/uL (ref 0.0–0.1)
Basophils Relative: 1 %
Eosinophils Absolute: 0 10*3/uL (ref 0.0–0.5)
Eosinophils Relative: 1 %
HCT: 32.1 % — ABNORMAL LOW (ref 36.0–46.0)
Hemoglobin: 10.7 g/dL — ABNORMAL LOW (ref 12.0–15.0)
Immature Granulocytes: 2 %
Lymphocytes Relative: 62 %
Lymphs Abs: 0.8 10*3/uL (ref 0.7–4.0)
MCH: 31.8 pg (ref 26.0–34.0)
MCHC: 33.3 g/dL (ref 30.0–36.0)
MCV: 95.5 fL (ref 80.0–100.0)
Monocytes Absolute: 0.2 10*3/uL (ref 0.1–1.0)
Monocytes Relative: 18 %
Neutro Abs: 0.2 10*3/uL — CL (ref 1.7–7.7)
Neutrophils Relative %: 16 %
Platelet Count: 173 10*3/uL (ref 150–400)
RBC: 3.36 MIL/uL — ABNORMAL LOW (ref 3.87–5.11)
RDW: 13 % (ref 11.5–15.5)
WBC Count: 1.3 10*3/uL — ABNORMAL LOW (ref 4.0–10.5)
nRBC: 0 % (ref 0.0–0.2)

## 2019-07-26 MED ORDER — HEPARIN SOD (PORK) LOCK FLUSH 100 UNIT/ML IV SOLN
500.0000 [IU] | Freq: Once | INTRAVENOUS | Status: AC
  Administered 2019-07-26: 500 [IU]
  Filled 2019-07-26: qty 5

## 2019-07-26 MED ORDER — CIPROFLOXACIN HCL 500 MG PO TABS: 500.0000 mg | ORAL_TABLET | Freq: Two times a day (BID) | ORAL | 0 refills | Status: DC

## 2019-07-26 MED ORDER — SODIUM CHLORIDE 0.9% FLUSH
10.0000 mL | Freq: Once | INTRAVENOUS | Status: AC
  Administered 2019-07-26: 10 mL
  Filled 2019-07-26: qty 10

## 2019-07-26 NOTE — Progress Notes (Signed)
[facility]  [phone] [phone]) [phone]     ID: Kathy Barker DOB: [birth year]  MR#: [id number]  NTZ#:[id number]  Patient Care Team: Kathy Neer, MD as PCP - General (Family Medicine) Kathy Germany, RN as Oncology Nurse Navigator Kathy Kaufmann, RN as Oncology Nurse Navigator Kathy Kussmaul, MD as Consulting Physician (General Surgery) Barker, Kathy Dad, MD as Consulting Physician (Oncology) Kathy Gibson, MD as Attending Physician (Radiation Oncology) Kathy Monarch, MD as Consulting Physician (Dermatology) Kathy Dock, NP OTHER MD:  CHIEF COMPLAINT: Left-sided breast cancer  CURRENT TREATMENT: Neoadjuvant chemotherapy   INTERVAL HISTORY: Kathy Barker returns today for follow up and treatment of her left-sided breast cancer.   She was started on neoadjvuant chematoherapy with doxorubicin and cyclophosphamide given on day 1 of a 14 day schedule, with Udenyca support on day 3. Today is day 8 cycle 2.  She underwent MRI guided biopsy 7/9 that showed one area of a CSL where excision was recommended and one area that was benign.  Kathy Barker is already aware of these results.  REVIEW OF SYSTEMS: Kathy Barker notes that she is increasingly fatigued.  She also says that she has an insatiable appetite.  She denies any nausea, vomiting, bowel, or bladder changes.  She has had no fever or chills.  A detailed ROS was otherwise non contributory.     HISTORY OF CURRENT ILLNESS: From the original intake note:  Kathy Barker herself noted a palpable lower-inner left breast lump and immediately brought it to medical attention.. She underwent bilateral diagnostic mammography with tomography and left breast ultrasonography at [facility] on 05/30/2019 showing: breast density category B; 1.7 cm lobulated mass in left breast at 8 o'clock; no significant left axillary abnormalities.  Accordingly on 06/01/2019 she proceeded to biopsy of the left breast area in question. The pathology  from this procedure ([id number]) showed: invasive ductal carcinoma, grade 3. Prognostic indicators significant for: estrogen receptor, 70% positive with weak staining intensity and progesterone receptor, 0% negative. Proliferation marker Ki67 at 80%. HER2 negative by immunohistochemistry (1+).  The patient's subsequent history is as detailed below.   PAST MEDICAL HISTORY: Past Medical History:  Diagnosis Date  . Allergic rhinitis   . Anxiety    "only during my divorce" (11/18/2012)  . Cancer ([place])   . Depression    "only during my divorce" (11/18/2012)  . GERD (gastroesophageal reflux disease)   . High cholesterol   . Hot flashes   . IBS (irritable bowel syndrome)   . Insomnia   . Migraines    "lately more often; before SVT I'd get them ~ q 6 months" (11/18/2012)  . PONV (postoperative nausea and vomiting)   . 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 CESAREAN SECTION  1992  . COMBINED HYSTERECTOMY VAGINAL / OOPHORECTOMY / A&P REPAIR  1999  . IR IMAGING GUIDED PORT INSERTION  06/30/2019  . 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 SUPRAVENTRICULAR TACHYCARDIA ABLATION N/A 11/18/2012   Procedure: SUPRAVENTRICULAR TACHYCARDIA ABLATION;  Surgeon: Coralyn Mark, MD;  Location: [facility];  Service: Cardiovascular;  Laterality: N/A;  . VAGINAL HYSTERECTOMY  ~ 2000    FAMILY HISTORY: Family History  Problem Relation Age of Onset  . Heart attack Father   . Alcohol abuse Father   . Heart  attack Paternal Grandfather   . Heart Problems Other        both sides of family  . Breast cancer Mother    Her father died at age 26 from alcohol abuse. Her mother is living at age 70 (as of 05/2019) and has a history of DCIS at age 41 and invasive lobular breast cancer at age 33. Kathy Barker has one  brother. She reports cancer of an unknown type in her maternal grandmother and prostate cancer in a maternal uncle.   GYNECOLOGIC HISTORY:  No LMP recorded. Patient has had a hysterectomy. Menarche: 59 years old Age at first live birth: 59 years old [place] P 2 LMP 2000 Contraceptive: never used HRT never used  Hysterectomy? yes BSO? 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  She will be on temporary disability during her chemotherapy.  She is divorced. She lives at home with son Kathy Barker, age 20, who works in Biomedical scientist in [place]. Son Kathy Barker, age 14, works in Sales executive here in [place]. 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     ADVANCED DIRECTIVES: Not in place. She intends to name both of her sons as her 106.   HEALTH MAINTENANCE: Social History   Tobacco Use  . Smoking status: Never Smoker  . Smokeless tobacco: Never Used  Vaping Use  . Vaping Use: Never used  Substance Use Topics  . Alcohol use: Yes    Comment: 11/18/2012 "shot of brandy couple times/month"  . Drug use: No     Colonoscopy: yes, date unsure  PAP: date unsure  Bone density: never done   Allergies  Allergen Reactions  . Fentanyl Nausea And Vomiting    Per patient " extreme nausea and vomiting "   . Other     Problems with stitches  . Versed [Midazolam] Nausea And Vomiting    Per patient " extreme N/V"   . Latex Rash    Current Outpatient Medications  Medication Sig Dispense Refill  . cetirizine (ZYRTEC) 10 MG tablet Take 10 mg by mouth daily.    [name] dexamethasone (DECADRON) 4 MG tablet Take 2 tablets by mouth twice a day starting on the day after chemotherapy (day 2), repeat the next day (day 3) then take 2 tablets in AM only day 4, then stop.  Take with food. 30 tablet 1  . lidocaine-prilocaine (EMLA) cream Apply to affected area once 30 g 3  . LIVALO 4 MG TABS Take 1 tablet by mouth daily.    [name] loratadine  (CLARITIN) 10 MG tablet Take 1 tablet (10 mg total) by mouth daily. 60 tablet 3  . LORazepam (ATIVAN) 0.5 MG tablet Use 1 tab daily as needed for anxiety and or nausea. 30 tablet 0  . omeprazole (PRILOSEC) 40 MG capsule Take one capsule daily for 5 days starting on chemo day, then daily as needed 60 capsule 3  . prochlorperazine (COMPAZINE) 10 MG tablet Take 1 tablet (10 mg total) by mouth every 6 (six) hours as needed (Nausea or vomiting). 30 tablet 1  . sertraline (ZOLOFT) 100 MG tablet Take 100 mg by mouth daily.    . SYMBICORT 160-4.5 MCG/ACT inhaler Inhale 2 puffs into the lungs 2 (two) times daily.    . temazepam (RESTORIL) 15 MG capsule Take 15 mg by mouth at bedtime.     No current facility-administered medications for this visit.    OBJECTIVE: White woman in no acute distress  Vitals:   07/26/19 1507  BP: 129/65  Pulse: 100  Resp: 18  Temp: 98.7 F (37.1 C)  SpO2: 99%     Body mass index is 23.69 kg/m.   Wt Readings from Last 3 Encounters:  07/26/19 131 lb 9.6 oz (59.7 kg)  07/19/19 131 lb (59.4 kg)  07/07/19 128 lb 8 oz (58.3 kg)      ECOG FS:1 - Symptomatic but completely ambulatory  GENERAL: Patient is a well appearing female in no acute distress HEENT:  Sclerae anicteric. Mask in place. Neck is supple.  NODES:  No cervical, supraclavicular, or axillary lymphadenopathy palpated.  BREAST EXAM:  Deferred. LUNGS:  Clear to auscultation bilaterally.  No wheezes or rhonchi. HEART:  Regular rate and rhythm. No murmur appreciated. ABDOMEN:  Soft, nontender.  Positive, normoactive bowel sounds. No organomegaly palpated. MSK:  No focal spinal tenderness to palpation. Full range of motion bilaterally in the upper extremities. EXTREMITIES:  No peripheral edema.   SKIN:  Clear with no obvious rashes or skin changes. No nail dyscrasia. NEURO:  Nonfocal. Well oriented.  Appropriate affect.     LAB RESULTS:  CMP     Component Value Date/Time   NA 141 07/26/2019 1415    K 3.7 07/26/2019 1415   CL 108 07/26/2019 1415   CO2 25 07/26/2019 1415   GLUCOSE 104 (H) 07/26/2019 1415   BUN 8 07/26/2019 1415   CREATININE 0.67 07/26/2019 1415   CALCIUM 9.2 07/26/2019 1415   PROT 6.3 (L) 07/26/2019 1415   ALBUMIN 3.7 07/26/2019 1415   AST 9 (L) 07/26/2019 1415   ALT 19 07/26/2019 1415   ALKPHOS 97 07/26/2019 1415   BILITOT 0.4 07/26/2019 1415   GFRNONAA >60 07/26/2019 1415   GFRAA >60 07/26/2019 1415    No results found for: TOTALPROTELP, ALBUMINELP, A1GS, A2GS, BETS, BETA2SER, GAMS, MSPIKE, SPEI  Lab Results  Component Value Date   WBC 1.3 (L) 07/26/2019   NEUTROABS 0.2 (LL) 07/26/2019   HGB 10.7 (L) 07/26/2019   HCT 32.1 (L) 07/26/2019   MCV 95.5 07/26/2019   PLT 173 07/26/2019    No results found for: LABCA2  No components found for: IWPYKD983  No results for input(s): INR in the last 168 hours.  No results found for: LABCA2  No results found for: JAS505  No results found for: LZJ673  No results found for: ALP379  No results found for: CA2729  No components found for: HGQUANT  No results found for: CEA1 / No results found for: CEA1   No results found for: AFPTUMOR  No results found for: CHROMOGRNA  No results found for: KPAFRELGTCHN, LAMBDASER, KAPLAMBRATIO (kappa/lambda light chains)  No results found for: HGBA, HGBA2QUANT, HGBFQUANT, HGBSQUAN (Hemoglobinopathy evaluation)   No results found for: LDH  No results found for: IRON, TIBC, IRONPCTSAT (Iron and TIBC)  No results found for: FERRITIN  Urinalysis No results found for: COLORURINE, APPEARANCEUR, LABSPEC, PHURINE, GLUCOSEU, HGBUR, BILIRUBINUR, KETONESUR, PROTEINUR, UROBILINOGEN, NITRITE, LEUKOCYTESUR   STUDIES: MR BREAST BILATERAL W WO CONTRAST INC CAD  Result Date: 06/30/2019 CLINICAL DATA:  59 year old female with recently diagnosed grade 3 invasive ductal carcinoma of the left breast post ultrasound-guided biopsy of a 1.7 cm mass at the 8 o'clock position 6 cm  from nipple. Family history of breast cancer with patient's mother having been diagnosed with breast cancer. LABS:  Not applicable. EXAM: BILATERAL BREAST MRI WITH AND WITHOUT CONTRAST TECHNIQUE: Multiplanar, multisequence MR images of both breasts were obtained prior to and  following the intravenous administration of 6 ml of Gadavist Three-dimensional MR images were rendered by post-processing of the original MR data on an independent workstation. The three-dimensional MR images were interpreted, and findings are reported in the following complete MRI report for this study. Three dimensional images were evaluated at the independent DynaCad workstation COMPARISON:  Previous exams. FINDINGS: Breast composition: b.  Scattered fibroglandular tissue. Background parenchymal enhancement: There is mild to moderate background parenchymal enhancement with multiple scattered enhancing foci in each breast which are considered benign given multiplicity and bilaterality. Right breast: No mass or abnormal enhancement. Left breast: Irregular enhancing mass in the lower inner left breast measures 2.7 cm AP, 1.9 cm transverse and 1.2 cm craniocaudal. Biopsy marking clip artifact is present in the superior portion of this mass. There is linear oriented clumped non mass enhancement within the upper central left breast mid depth (subtraction image 83) measuring 1.6 cm. There are several enhancing masses in the central left breast mid to posterior depth and predominantly located lateral to the dominant mass (subtraction images 99 through 101), with these masses all together measuring 2.9 cm transverse and 2.7 cm craniocaudal. These are suspicious in appearance and not definitely related to normal background enhancement. An additional oval enhancing mass in the lower central left breast mid depth (subtraction image 106) measures 0.7 cm. Lymph nodes: No morphologically abnormal axillary lymph nodes. No internal mammary lymphadenopathy  seen. Ancillary findings:  None. IMPRESSION: 1. Biopsy proven malignancy in the lower inner left breast measures 2.7 x 1.9 x 1.2 cm. 2. 1.6 cm linear clumped non mass enhancement in the central upper left breast (subtraction image 83) 3. Multiple enhancing masses in the central left breast all together measuring 2.9 x 2.7 cm (subtraction images 99-101). 4. 0.7 cm enhancing mass in the lower central left breast (subtraction image 106). 5.  No MRI evidence of malignancy in the right breast. RECOMMENDATION: If breast conservation is a consideration, then suggest the patient return for targeted ultrasound to evaluate for a sonographic correlate (and subsequent biopsies) for the enhancing masses and clumped enhancement in the left breast. If these areas cannot be identified sonographically, then recommend MRI guided biopsy of at least 2 of these additional suspicious sites in the left breast. BI-RADS CATEGORY  4: Suspicious. Electronically Signed   By: Everlean Alstrom M.D.   On: 06/30/2019 08:58   US BREAST LTD UNI LEFT INC AXILLA  Result Date: 07/13/2019 CLINICAL DATA:  59 year old with recent diagnosis of grade 3 invasive ductal carcinoma involving the LOWER INNER QUADRANT of the LEFT breast (ER positive, PR negative, HER 2 Neu negative, Ki67 80%). The patient has had 1 dose of neoadjuvant chemotherapy. Pretreatment MRI demonstrated potential clustered masses immediately adjacent and LATERAL to the biopsy-proven malignancy, a 7 mm mass in the LOWER breast near 6 o'clock location, and linear non-mass enhancement in the UPPER breast at MIDDLE depth. Second-look ultrasound is performed to determine if there is a potential sonographic correlate for the masses in the LOWER breast. EXAM: ULTRASOUND OF THE LEFT BREAST COMPARISON:  MRI breast 06/28/2025. The prior LEFT breast ultrasound 05/30/2019 is compared, but was performed only in the [facility]. FINDINGS: Targeted ultrasound is performed, showing no  sonographic correlate for the MRI findings in the LOWER breast. Specifically, the discrete 7 mm mass at the near 6 o'clock location was not identified. I documented the mass in the [place] with the associated biopsy marker clip. IMPRESSION: No sonographic correlate for the MRI findings  in the LEFT breast. RECOMMENDATION: MRI biopsy of the linear non-mass enhancement in the UPPER LEFT breast and the 7 mm nodule in the LOWER LEFT breast which will assist in determining the extent of disease (as these 2 areas are the farthest apart). I have discussed the findings and recommendations with the patient. The patient will be contacted in order to schedule the MRI biopsy. BI-RADS CATEGORY  6: Known biopsy-proven malignancy. Electronically Signed   By: Evangeline Dakin M.D.   On: 07/13/2019 09:49   ECHOCARDIOGRAM COMPLETE  Result Date: 06/28/2019    ECHOCARDIOGRAM REPORT   Patient Name:   Kathy Barker Date of Exam: 06/28/2019 Medical Rec #:  [id number]     Height:       62.5 in Accession #:    [id number]    Weight:       129.6 lb Date of Birth:  11/06/1960      BSA:          1.599 m Patient Age:    61 years      BP:           146/80 mmHg Patient Gender: F             HR:           65 bpm. Exam Location:  Outpatient Procedure: 2D Echo, 3D Echo, Cardiac Doppler, Color Doppler and Strain Analysis Indications:    Z51.11 Encounter for antineoplastic chemotheraphy  History:        Patient has prior history of Echocardiogram examinations, most                 recent 10/07/2012. Risk Factors:Dyslipidemia and GERD.  Sonographer:    Jonelle Sidle Dance Referring Phys: [place]  1. Left ventricular ejection fraction, by estimation, is 60 to 65%. The left ventricle has normal function. The left ventricle has no regional wall motion abnormalities. Left ventricular diastolic parameters are indeterminate. The average left ventricular global longitudinal strain is -16.5 %. The global longitudinal strain is  normal.  2. Right ventricular systolic function is normal. The right ventricular size is normal. There is normal pulmonary artery systolic pressure.  3. The mitral valve is normal in structure. Mild mitral valve regurgitation. No evidence of mitral stenosis.  4. The aortic valve is normal in structure. Aortic valve regurgitation is not visualized. No aortic stenosis is present.  5. The inferior vena cava is normal in size with greater than 50% respiratory variability, suggesting right atrial pressure of 3 mmHg.  6. Compared to echo 2014, no significant change. FINDINGS  Left Ventricle: Left ventricular ejection fraction, by estimation, is 60 to 65%. The left ventricle has normal function. The left ventricle has no regional wall motion abnormalities. The average left ventricular global longitudinal strain is -16.5 %. The global longitudinal strain is normal. The left ventricular internal cavity size was normal in size. There is no left ventricular hypertrophy. Left ventricular diastolic parameters are indeterminate. Normal left ventricular filling pressure. Right Ventricle: The right ventricular size is normal. No increase in right ventricular wall thickness. Right ventricular systolic function is normal. There is normal pulmonary artery systolic pressure. The tricuspid regurgitant velocity is 2.12 m/s, and  with an assumed right atrial pressure of 3 mmHg, the estimated right ventricular systolic pressure is 82.9 mmHg. Left Atrium: Left atrial size was normal in size. Right Atrium: Right atrial size was normal in size. Pericardium: There is no evidence of pericardial effusion. Mitral Valve: The mitral  valve is normal in structure. Normal mobility of the mitral valve leaflets. Mild mitral valve regurgitation. No evidence of mitral valve stenosis. Tricuspid Valve: The tricuspid valve is normal in structure. Tricuspid valve regurgitation is not demonstrated. No evidence of tricuspid stenosis. Aortic Valve: The aortic  valve is normal in structure. Aortic valve regurgitation is not visualized. No aortic stenosis is present. Pulmonic Valve: The pulmonic valve was normal in structure. Pulmonic valve regurgitation is not visualized. No evidence of pulmonic stenosis. Aorta: The aortic root is normal in size and structure. Venous: The inferior vena cava is normal in size with greater than 50% respiratory variability, suggesting right atrial pressure of 3 mmHg. IAS/Shunts: The interatrial septum appears to be lipomatous. No atrial level shunt detected by color flow Doppler.  LEFT VENTRICLE PLAX 2D LVIDd:         3.60 cm  Diastology LVIDs:         2.50 cm  LV e' lateral:   9.23 cm/s LV PW:         1.20 cm  LV E/e' lateral: 10.1 LV IVS:        1.00 cm  LV e' medial:    6.74 cm/s LVOT diam:     1.50 cm  LV E/e' medial:  13.8 LV SV:         33 LV SV Index:   21       2D Longitudinal Strain LVOT Area:     1.77 cm 2D Strain GLS (A2C):   -17.2 %                         2D Strain GLS (A3C):   -13.8 %                         2D Strain GLS (A4C):   -18.5 %                         2D Strain GLS Avg:     -16.5 %                          3D Volume EF:                         3D EF:        59 %                         LV EDV:       106 ml                         LV ESV:       43 ml                         LV SV:        63 ml RIGHT VENTRICLE             IVC RV Basal diam:  2.10 cm     IVC diam: 1.30 cm RV S prime:     10.10 cm/s TAPSE (M-mode): 1.7 cm LEFT ATRIUM             Index       RIGHT ATRIUM  Index LA diam:        3.30 cm 2.06 cm/m  RA Area:     8.21 cm LA Vol (A2C):   32.2 ml 20.13 ml/m RA Volume:   15.00 ml 9.38 ml/m LA Vol (A4C):   30.7 ml 19.20 ml/m LA Biplane Vol: 32.3 ml 20.20 ml/m  AORTIC VALVE LVOT Vmax:   82.50 cm/s LVOT Vmean:  54.100 cm/s LVOT VTI:    0.188 m  AORTA Ao Root diam: 2.70 cm Ao Asc diam:  2.80 cm MITRAL VALVE               TRICUSPID VALVE MV Area (PHT): 4.06 cm    TR Peak grad:   18.0 mmHg MV Decel Time:  187 msec    TR Vmax:        212.00 cm/s MV E velocity: 93.40 cm/s MV A velocity: 65.60 cm/s  SHUNTS MV E/A ratio:  1.42        Systemic VTI:  0.19 m                            Systemic Diam: 1.50 cm Fransico Him MD Electronically signed by Fransico Him MD Signature Date/Time: 06/28/2019/1:25:36 PM    Final    MM CLIP PLACEMENT LEFT  Result Date: 07/22/2019 CLINICAL DATA:  Two MRI guided biopsies were recommended of the left breast and performed today. The patient has a recent personal history left breast cancer (containing a ribbon shaped biopsy clip) diagnosed in the lower inner quadrant biopsied under ultrasound guidance. EXAM: DIAGNOSTIC LEFT MAMMOGRAM POST MRI BIOPSIES (2) COMPARISON:  Previous exam(s). FINDINGS: Mammographic images were obtained following MRI guided biopsies of clumped linear enhancement in the upper central left breast and of a 0.7 cm mass in the inferior central left breast. The barbell biopsy clip is satisfactorily positioned in the upper central/slightly lateral left breast in the expected location of the MRI guided biopsy. The cylinder shaped biopsy clip is in the expected location of the biopsied mass in the inferior central left breast. The barbell shaped biopsy clip is approximately 3 cm superior to the ribbon shaped biopsy clip in the known cancer on the 90 degree lateral projection. On the cc view, the barbell shaped biopsy clip is 5 cm lateral to the ribbon shaped biopsy clip within the known cancer. IMPRESSION: Appropriate positioning of the barbell and cylinder shaped biopsy marking clips in the left breast. Final Assessment: Post Procedure Mammograms for Marker Placement Electronically Signed   By: Curlene Dolphin M.D.   On: 07/22/2019 10:23   MR LT BREAST BX W LOC DEV 1ST LESION IMAGE BX SPEC MR GUIDE  Addendum Date: 07/25/2019   ADDENDUM REPORT: 07/25/2019 14:26 ADDENDUM: Pathology revealed COMPLEX SCLEROSING LESION WITH CALCIFICATIONS, FIBROADENOMATOID CHANGES of the Left  breast, upper central (barbell clip). This was found to be concordant by Dr. Curlene Dolphin, with excision recommended. Pathology revealed FOCAL USUAL DUCTAL HYPERPLASIA, COLUMNAR AND FIBROCYSTIC CELL CHANGES of the Left breast, inferior central (cylinder clip). This was found to be concordant by Dr. Curlene Dolphin. Pathology results were discussed with the patient by telephone. The patient reported doing well after the biopsies with tenderness at the sites. Post biopsy instructions and care were reviewed and questions were answered. The patient was encouraged to call [facility] for any additional concerns. My direct phone number was provided for the patient. The patient has a recent diagnosis of Left breast  cancer and should follow her outlined treatment plan. Dr. Autumn Messing and Dr. Gunnar Bulla Barker were notified of biopsy results via EPIC message on July 25, 2019. Pathology results reported by Terie Purser, RN on 07/25/2019. Electronically Signed   By: Curlene Dolphin M.D.   On: 07/25/2019 14:26   Result Date: 07/25/2019 CLINICAL DATA:  Two MRI guided biopsies of the left breast were recommended to evaluate clumped linear enhancement in the upper central left breast and a 0.7 cm enhancing mass in the inferior central left breast. The patient has a recent diagnosis of a cancer in the lower inner quadrant of the left breast, containing a ribbon shaped biopsy clip. She has begun neoadjuvant chemotherapy. EXAM: MRI GUIDED CORE NEEDLE BIOPSY OF THE LEFT BREAST X 2 TECHNIQUE: Multiplanar, multisequence MR imaging of the left breast was performed both before and after administration of intravenous contrast. CONTRAST:  70m GADAVIST GADOBUTROL 1 MMOL/ML IV SOLN COMPARISON:  Previous exams. FINDINGS: I met with the patient, and we discussed the procedure of MRI guided biopsy, including risks, benefits, and alternatives. Specifically, we discussed the risks of infection, bleeding, tissue injury, clip  migration, and inadequate sampling. Informed, written consent was given. The usual time out protocol was performed immediately prior to the procedure. Using sterile technique, 1% Lidocaine, MRI guidance, and a 9 gauge vacuum assisted device, biopsy was performed of clumped linear enhancement in the upper central left breast using a lateral approach. At the conclusion of the procedure, a barbell tissue marker clip was deployed into the biopsy cavity. Follow-up 2-view mammogram was performed and dictated separately. Using sterile technique, 1% Lidocaine, MRI guidance, and a 9 gauge vacuum assisted device, biopsy was performed of 0.7 cm mass in the inferior central left breast using a lateral approach. At the conclusion of the procedure, a cylinder tissue marker clip was deployed into the biopsy cavity. Follow-up 2-view mammogram was performed and dictated separately. IMPRESSION: Two MRI guided biopsies of the left breast. No apparent complications. Electronically Signed: By: SCurlene DolphinM.D. On: 07/22/2019 10:26   MR LT BREAST BX W LOC DEV EA ADD LESION IMAGE BX SPEC MR GUIDE  Addendum Date: 07/25/2019   ADDENDUM REPORT: 07/25/2019 14:26 ADDENDUM: Pathology revealed COMPLEX SCLEROSING LESION WITH CALCIFICATIONS, FIBROADENOMATOID CHANGES of the Left breast, upper central (barbell clip). This was found to be concordant by Dr. SCurlene Dolphin with excision recommended. Pathology revealed FOCAL USUAL DUCTAL HYPERPLASIA, COLUMNAR AND FIBROCYSTIC CELL CHANGES of the Left breast, inferior central (cylinder clip). This was found to be concordant by Dr. SCurlene Dolphin Pathology results were discussed with the patient by telephone. The patient reported doing well after the biopsies with tenderness at the sites. Post biopsy instructions and care were reviewed and questions were answered. The patient was encouraged to [facility]. My direct phone number was provided  for the patient. The patient has a recent diagnosis of Left breast cancer and should follow her outlined treatment plan. Dr. PAutumn Messingand Dr. GGunnar BullaMagrinat were notified of biopsy results via EPIC message on July 25, 2019. Pathology results reported by LTerie Purser RN on 07/25/2019. Electronically Signed   By: SCurlene DolphinM.D.   On: 07/25/2019 14:26   Result Date: 07/25/2019 CLINICAL DATA:  Two MRI guided biopsies of the left breast were recommended to evaluate clumped linear enhancement in the upper central left breast and a 0.7 cm enhancing mass in the inferior central left breast. The patient has  a recent diagnosis of a cancer in the lower inner quadrant of the left breast, containing a ribbon shaped biopsy clip. She has begun neoadjuvant chemotherapy. EXAM: MRI GUIDED CORE NEEDLE BIOPSY OF THE LEFT BREAST X 2 TECHNIQUE: Multiplanar, multisequence MR imaging of the left breast was performed both before and after administration of intravenous contrast. CONTRAST:  29m GADAVIST GADOBUTROL 1 MMOL/ML IV SOLN COMPARISON:  Previous exams. FINDINGS: I met with the patient, and we discussed the procedure of MRI guided biopsy, including risks, benefits, and alternatives. Specifically, we discussed the risks of infection, bleeding, tissue injury, clip migration, and inadequate sampling. Informed, written consent was given. The usual time out protocol was performed immediately prior to the procedure. Using sterile technique, 1% Lidocaine, MRI guidance, and a 9 gauge vacuum assisted device, biopsy was performed of clumped linear enhancement in the upper central left breast using a lateral approach. At the conclusion of the procedure, a barbell tissue marker clip was deployed into the biopsy cavity. Follow-up 2-view mammogram was performed and dictated separately. Using sterile technique, 1% Lidocaine, MRI guidance, and a 9 gauge vacuum assisted device, biopsy was performed of 0.7 cm mass in the inferior central left  breast using a lateral approach. At the conclusion of the procedure, a cylinder tissue marker clip was deployed into the biopsy cavity. Follow-up 2-view mammogram was performed and dictated separately. IMPRESSION: Two MRI guided biopsies of the left breast. No apparent complications. Electronically Signed: By: SCurlene DolphinM.D. On: 07/22/2019 10:26   IR IMAGING GUIDED PORT INSERTION  Result Date: 06/30/2019 INDICATION: 59year old female with a history of breast carcinoma EXAM: IMAGE GUIDED PORT CATHETER PLACEMENT MEDICATIONS: 2 g Ancef; The antibiotic was administered within an appropriate time interval prior to skin puncture. ANESTHESIA/SEDATION: Moderate (conscious) sedation was not employed during this procedure. 1 mg Dilaudid. 25 mg Benadryl Moderate Sedation Time: 0 minutes. The patient's level of consciousness and vital signs were monitored continuously by radiology nursing throughout the procedure under my direct supervision. FLUOROSCOPY TIME:  Fluoroscopy Time: 0 minutes 6 seconds (1 mGy). COMPLICATIONS: None PROCEDURE: The procedure, risks, benefits, and alternatives were explained to the patient. Questions regarding the procedure were encouraged and answered. The patient understands and consents to the procedure. Ultrasound survey was performed with images stored and sent to PACs. The right neck and chest was prepped with chlorhexidine, and draped in the usual sterile fashion using maximum barrier technique (cap and mask, sterile gown, sterile gloves, large sterile sheet, hand hygiene and cutaneous antiseptic). Antibiotic prophylaxis was provided with 2.0g Ancef administered IV one hour prior to skin incision. Local anesthesia was attained by infiltration with 1% lidocaine without epinephrine. Ultrasound demonstrated patency of the right internal jugular vein, and this was documented with an image. Under real-time ultrasound guidance, this vein was accessed with a 21 gauge micropuncture needle and  image documentation was performed. A small dermatotomy was made at the access site with an 11 scalpel. A 0.018" wire was advanced into the SVC and used to estimate the length of the internal catheter. The access needle exchanged for a 90F micropuncture vascular sheath. The 0.018" wire was then removed and a 0.035" wire advanced into the IVC. An appropriate location for the subcutaneous reservoir was selected below the clavicle and an incision was made through the skin and underlying soft tissues. The subcutaneous tissues were then dissected using a combination of blunt and sharp surgical technique and a pocket was formed. A single lumen power injectable portacatheter was then tunneled through  the subcutaneous tissues from the pocket to the dermatotomy and the port reservoir placed within the subcutaneous pocket. The venous access site was then serially dilated and a peel away vascular sheath placed over the wire. The wire was removed and the port catheter advanced into position under fluoroscopic guidance. The catheter tip is positioned in the cavoatrial junction. This was documented with a spot image. The portacatheter was then tested and found to flush and aspirate well. The pocket was then closed in two layers using first subdermal inverted interrupted absorbable sutures followed by a running subcuticular suture. The epidermis was then sealed with Dermabond. The dermatotomy at the venous access site was also seal with Dermabond. Finally, the Saunemin needle access was placed and the port was again flushed. Patient tolerated the procedure well and remained hemodynamically stable throughout. No complications encountered and no significant blood loss encountered IMPRESSION: Status post right IJ port catheter placement. Signed, Dulcy Fanny. Dellia Nims, RPVI [facility] Radiology Electronically Signed   By: Corrie Mckusick D.O.   On: 06/30/2019 13:46     ELIGIBLE FOR  AVAILABLE RESEARCH PROTOCOL: no  ASSESSMENT: 59 y.o. 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  (1) Oncotype score of 51 predicts a risk of recurrence outside the breast in the next 9 years of greater than 39% if the patient's only systemic therapy is antiestrogens for 5 years.  It also predicts a greater than 15% benefit from chemotherapy  (2) neoadjuvant chemotherapy will consist of doxorubicin and cyclophosphamide in dose dense fashion x4 beginning 06/30/2019, to be followed by weekly paclitaxel and carboplatin weekly x12  (3) definitive surgery pending  (4) adjuvant radiation to follow: Consider capecitabine sensitization   PLAN: Kathy Barker tolerated her second cycle of neoadjuvant chemtoehrapy moderately well.  Her tumor is shrinking, giving evidence of a clinical response.  She is neutropenic with an ANC of 0.2.  We reviewed neutropenic precautions in detail.  I sent in Cipro for her to take BID for the next 5 days.    Kathy Barker and I reviewed her pathology results including the fact that she will need an additional excision of the CSL.  She understands that there is a chance that there can be DCIS there, and she may end up needing radiation at that site.  She understands this.    Kathy Barker will decrease her dexamethasone to 1 tablet BID after her next cycle of chemotherapy. This will hopefully decrease her insatiable appetite, and reflux.  We will see Kathy Barker back in 1 week for labs, f/u, and cycle 3 of treatment.  She knows to call for any questions that may arise between now and her next appointment.  We are happy to see her sooner if needed.  Total encounter time 20 minutes.Wilber Bihari, NP 07/26/19 3:23 PM Medical Oncology and Hematology [facility] [place], [place]  [address] Tel. [phone]    Fax. [phone]     *Total Encounter Time as defined by the Centers for Medicare and Medicaid Services includes, in addition to the face-to-face time of a patient visit (documented in the note above) non-face-to-face time: obtaining and reviewing outside history, ordering and reviewing medications, tests or procedures, care coordination (communications with other health care professionals or caregivers) and documentation in the medical record.

## 2019-07-28 ENCOUNTER — Other Ambulatory Visit: Payer: 59

## 2019-07-28 ENCOUNTER — Ambulatory Visit: Payer: 59

## 2019-07-28 ENCOUNTER — Ambulatory Visit: Payer: 59 | Admitting: Adult Health

## 2019-07-29 ENCOUNTER — Encounter: Payer: Self-pay | Admitting: Oncology

## 2019-07-30 ENCOUNTER — Ambulatory Visit: Payer: 59

## 2019-08-01 ENCOUNTER — Other Ambulatory Visit: Payer: Self-pay

## 2019-08-01 DIAGNOSIS — C50312 Malignant neoplasm of lower-inner quadrant of left female breast: Secondary | ICD-10-CM

## 2019-08-01 MED FILL — Fosaprepitant Dimeglumine For IV Infusion 150 MG (Base Eq): INTRAVENOUS | Qty: 5 | Status: AC

## 2019-08-01 MED FILL — Dexamethasone Sodium Phosphate Inj 100 MG/10ML: INTRAMUSCULAR | Qty: 1 | Status: AC

## 2019-08-02 ENCOUNTER — Encounter: Payer: Self-pay | Admitting: Adult Health

## 2019-08-02 ENCOUNTER — Encounter: Payer: Self-pay | Admitting: Oncology

## 2019-08-02 ENCOUNTER — Inpatient Hospital Stay: Payer: 59

## 2019-08-02 ENCOUNTER — Other Ambulatory Visit: Payer: Self-pay

## 2019-08-02 ENCOUNTER — Other Ambulatory Visit: Payer: Managed Care, Other (non HMO)

## 2019-08-02 ENCOUNTER — Ambulatory Visit: Payer: Managed Care, Other (non HMO)

## 2019-08-02 ENCOUNTER — Inpatient Hospital Stay (HOSPITAL_BASED_OUTPATIENT_CLINIC_OR_DEPARTMENT_OTHER): Payer: 59 | Admitting: Adult Health

## 2019-08-02 VITALS — BP 136/67 | HR 81 | Temp 98.3°F | Resp 18 | Ht 62.5 in | Wt 132.5 lb

## 2019-08-02 DIAGNOSIS — C50312 Malignant neoplasm of lower-inner quadrant of left female breast: Secondary | ICD-10-CM

## 2019-08-02 DIAGNOSIS — Z170 Estrogen receptor positive status [ER+]: Secondary | ICD-10-CM | POA: Diagnosis not present

## 2019-08-02 DIAGNOSIS — Z5111 Encounter for antineoplastic chemotherapy: Secondary | ICD-10-CM | POA: Diagnosis not present

## 2019-08-02 DIAGNOSIS — Z95828 Presence of other vascular implants and grafts: Secondary | ICD-10-CM

## 2019-08-02 LAB — CBC WITH DIFFERENTIAL (CANCER CENTER ONLY)
Abs Immature Granulocytes: 0.44 10*3/uL — ABNORMAL HIGH (ref 0.00–0.07)
Basophils Absolute: 0.1 10*3/uL (ref 0.0–0.1)
Basophils Relative: 1 %
Eosinophils Absolute: 0 10*3/uL (ref 0.0–0.5)
Eosinophils Relative: 0 %
HCT: 31.2 % — ABNORMAL LOW (ref 36.0–46.0)
Hemoglobin: 10.4 g/dL — ABNORMAL LOW (ref 12.0–15.0)
Immature Granulocytes: 5 %
Lymphocytes Relative: 15 %
Lymphs Abs: 1.3 10*3/uL (ref 0.7–4.0)
MCH: 31.4 pg (ref 26.0–34.0)
MCHC: 33.3 g/dL (ref 30.0–36.0)
MCV: 94.3 fL (ref 80.0–100.0)
Monocytes Absolute: 0.4 10*3/uL (ref 0.1–1.0)
Monocytes Relative: 4 %
Neutro Abs: 6.7 10*3/uL (ref 1.7–7.7)
Neutrophils Relative %: 75 %
Platelet Count: 78 10*3/uL — ABNORMAL LOW (ref 150–400)
RBC: 3.31 MIL/uL — ABNORMAL LOW (ref 3.87–5.11)
RDW: 13.8 % (ref 11.5–15.5)
WBC Count: 8.9 10*3/uL (ref 4.0–10.5)
nRBC: 0 % (ref 0.0–0.2)

## 2019-08-02 LAB — CMP (CANCER CENTER ONLY)
ALT: 28 U/L (ref 0–44)
AST: 16 U/L (ref 15–41)
Albumin: 3.8 g/dL (ref 3.5–5.0)
Alkaline Phosphatase: 96 U/L (ref 38–126)
Anion gap: 10 (ref 5–15)
BUN: 8 mg/dL (ref 6–20)
CO2: 23 mmol/L (ref 22–32)
Calcium: 8.8 mg/dL — ABNORMAL LOW (ref 8.9–10.3)
Chloride: 110 mmol/L (ref 98–111)
Creatinine: 0.7 mg/dL (ref 0.44–1.00)
GFR, Est AFR Am: >60 mL/min (ref >60)
GFR, Estimated: >60 mL/min (ref >60)
Glucose, Bld: 146 mg/dL — ABNORMAL HIGH (ref 70–99)
Potassium: 3.6 mmol/L (ref 3.5–5.1)
Sodium: 143 mmol/L (ref 135–145)
Total Bilirubin: 0.2 mg/dL — ABNORMAL LOW (ref 0.3–1.2)
Total Protein: 6.1 g/dL — ABNORMAL LOW (ref 6.5–8.1)

## 2019-08-02 MED ORDER — SODIUM CHLORIDE 0.9% FLUSH
10.0000 mL | Freq: Once | INTRAVENOUS | Status: AC
  Administered 2019-08-02: 10 mL
  Filled 2019-08-02: qty 10

## 2019-08-02 NOTE — Progress Notes (Signed)
Met w/ pt to introduce myself as her Arboriculturist and to discuss copay assistance.  Pt has met her deductible for the year so copay assistance shouldn't be needed.  I informed her of the [employer] and went over what it covers.  She would like to apply so she will bring proof of income on 08/09/19.  She has my card for any questions or concerns she may have in the future.

## 2019-08-02 NOTE — Patient Instructions (Signed)

## 2019-08-02 NOTE — Progress Notes (Signed)
Nutrition  RD planning to see patient in infusion today.  Infusion cancelled and visit changed to phone.    RD called patient, no answer. Left message with call back number.  Verlaine Embry B. Zenia Resides, [place], [place] Registered Dietitian [phone] (pager)

## 2019-08-02 NOTE — Progress Notes (Addendum)
[place]  [phone] [phone]     ID: Kathy Barker DOB: [birth year]  MR#: [id number]  LNL#:[id number]  Patient Care Team: [name], MD as PCP - General (Family Medicine) Rockwell Germany, RN as Oncology Nurse Navigator Mauro Kaufmann, RN as Oncology Nurse Navigator [name], MD as Consulting Physician (General Surgery) [name], [name], MD as Consulting Physician (Oncology) [name], MD as Attending Physician (Radiation Oncology) Lavonna Monarch, MD as Consulting Physician (Dermatology) Scot Dock, NP OTHER MD:  CHIEF COMPLAINT: Left-sided breast cancer  CURRENT TREATMENT: Neoadjuvant chemotherapy   INTERVAL HISTORY: Bennie returns today for follow up and treatment of her left-sided breast cancer.   She was started on neoadjvuant chematoherapy with doxorubicin and cyclophosphamide given on day 1 of a 14 day schedule, with Udenyca support on day 3. Today is day 1 cycle 3.  Her platelet count today is 78, she has no easy bruising or bleeding.  REVIEW OF SYSTEMS: Harlene is doing well today.  She notes her fatigue is improving, but is still slightly worse than prior. She notes that her tongue has had some mild sensitivity and her gums feel sensitive as well.  She denies fever or chills, chest pain, palpitations, cough, bowel/bladder changes, headaches, vision issues.  A detailed ROS was otherwise non contributory.     HISTORY OF CURRENT ILLNESS: From the original intake note:  Kathy Barker herself noted a palpable lower-inner left breast lump and immediately brought it to medical attention.. She underwent bilateral diagnostic mammography with tomography and left breast ultrasonography at [facility] on 05/30/2019 showing: breast density category B; 1.7 cm lobulated mass in left breast at 8 o'clock; no significant left axillary abnormalities.  Accordingly on 06/01/2019 she proceeded to biopsy of the left breast area in question.  The pathology from this procedure ([id number]) showed: invasive ductal carcinoma, grade 3. Prognostic indicators significant for: estrogen receptor, 70% positive with weak staining intensity and progesterone receptor, 0% negative. Proliferation marker Ki67 at 80%. HER2 negative by immunohistochemistry (1+).  The patient's subsequent history is as detailed below.   PAST MEDICAL HISTORY: Past Medical History:  Diagnosis Date  . Allergic rhinitis   . Anxiety    "only during my divorce" (11/18/2012)  . Cancer ([place])   . Depression    "only during my divorce" (11/18/2012)  . GERD (gastroesophageal reflux disease)   . High cholesterol   . Hot flashes   . IBS (irritable bowel syndrome)   . Insomnia   . Migraines    "lately more often; before SVT I'd get them ~ q 6 months" (11/18/2012)  . PONV (postoperative nausea and vomiting)   . 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 CESAREAN SECTION  1992  . COMBINED HYSTERECTOMY VAGINAL / OOPHORECTOMY / A&P REPAIR  1999  . IR IMAGING GUIDED PORT INSERTION  06/30/2019  . 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 SUPRAVENTRICULAR TACHYCARDIA ABLATION N/A 11/18/2012   Procedure: SUPRAVENTRICULAR TACHYCARDIA ABLATION;  Surgeon: Coralyn Mark, MD;  Location: [facility];  Service: Cardiovascular;  Laterality: N/A;  . VAGINAL HYSTERECTOMY  ~ 2000    FAMILY HISTORY: Family History  Problem Relation Age of Onset  . Heart attack Father   . Alcohol abuse Father   . Heart attack Paternal  Grandfather   . Heart Problems Other        both sides of family  . Breast cancer Mother    Her father died at age 18 from alcohol abuse. Her mother is living at age 82 (as of 05/2019) and has a history of DCIS at age 86 and invasive lobular breast cancer at age 92. Kathy Barker  has one brother. She reports cancer of an unknown type in her maternal grandmother and prostate cancer in a maternal uncle.   GYNECOLOGIC HISTORY:  No LMP recorded. Patient has had a hysterectomy. Menarche: 59 years old Age at first live birth: 59 years old [place] P 2 LMP 2000 Contraceptive: never used HRT never used  Hysterectomy? yes BSO? 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  She will be on temporary disability during her chemotherapy.  She is divorced. She lives at home with son [name], age 43, who works in Biomedical scientist in [place]. Son [name], age 73, works in Sales executive here in [place]. 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     ADVANCED DIRECTIVES: Not in place. She intends to name both of her sons as her 51.   HEALTH MAINTENANCE: Social History   Tobacco Use  . Smoking status: Never Smoker  . Smokeless tobacco: Never Used  Vaping Use  . Vaping Use: Never used  Substance Use Topics  . Alcohol use: Yes    Comment: 11/18/2012 "shot of brandy couple times/month"  . Drug use: No     Colonoscopy: yes, date unsure  PAP: date unsure  Bone density: never done   Allergies  Allergen Reactions  . Fentanyl Nausea And Vomiting    Per patient " extreme nausea and vomiting "   . Other     Problems with stitches  . Versed [Midazolam] Nausea And Vomiting    Per patient " extreme N/V"   . Latex Rash    Current Outpatient Medications  Medication Sig Dispense Refill  . cetirizine (ZYRTEC) 10 MG tablet Take 10 mg by mouth daily.    . ciprofloxacin (CIPRO) 500 MG tablet Take 1 tablet (500 mg total) by mouth 2 (two) times daily. 10 tablet 0  . dexamethasone (DECADRON) 4 MG tablet Take 2 tablets by mouth twice a day starting on the day after chemotherapy (day 2), repeat the next day (day 3) then take 2 tablets in AM only day 4, then stop.  Take with food. 30 tablet 1  .  lidocaine-prilocaine (EMLA) cream Apply to affected area once 30 g 3  . LIVALO 4 MG TABS Take 1 tablet by mouth daily.    [name] loratadine (CLARITIN) 10 MG tablet Take 1 tablet (10 mg total) by mouth daily. 60 tablet 3  . LORazepam (ATIVAN) 0.5 MG tablet Use 1 tab daily as needed for anxiety and or nausea. 30 tablet 0  . omeprazole (PRILOSEC) 40 MG capsule Take one capsule daily for 5 days starting on chemo day, then daily as needed 60 capsule 3  . prochlorperazine (COMPAZINE) 10 MG tablet Take 1 tablet (10 mg total) by mouth every 6 (six) hours as needed (Nausea or vomiting). 30 tablet 1  . sertraline (ZOLOFT) 100 MG tablet Take 100 mg by mouth daily.    . SYMBICORT 160-4.5 MCG/ACT inhaler Inhale 2 puffs into the lungs 2 (two) times daily.    . temazepam (RESTORIL) 15 MG capsule Take 15 mg by mouth at bedtime.  No current facility-administered medications for this visit.    OBJECTIVE: White woman in no acute distress  Vitals:   08/02/19 0928  BP: 136/67  Pulse: 81  Resp: 18  Temp: 98.3 F (36.8 C)  SpO2: 100%     Body mass index is 23.85 kg/m.   Wt Readings from Last 3 Encounters:  08/02/19 132 lb 8 oz (60.1 kg)  07/26/19 131 lb 9.6 oz (59.7 kg)  07/19/19 131 lb (59.4 kg)      ECOG FS:1 - Symptomatic but completely ambulatory  GENERAL: Patient is a well appearing female in no acute distress HEENT:  Sclerae anicteric. Mask in place. Neck is supple.  NODES:  No cervical, supraclavicular, or axillary lymphadenopathy palpated.  BREAST EXAM:  Deferred. LUNGS:  Clear to auscultation bilaterally.  No wheezes or rhonchi. HEART:  Regular rate and rhythm. No murmur appreciated. ABDOMEN:  Soft, nontender.  Positive, normoactive bowel sounds. No organomegaly palpated. MSK:  No focal spinal tenderness to palpation. Full range of motion bilaterally in the upper extremities. EXTREMITIES:  No peripheral edema.   SKIN:  Clear with no obvious rashes or skin changes. No nail dyscrasia. NEURO:   Nonfocal. Well oriented.  Appropriate affect.     LAB RESULTS:  CMP     Component Value Date/Time   NA 143 08/02/2019 0901   K 3.6 08/02/2019 0901   CL 110 08/02/2019 0901   CO2 23 08/02/2019 0901   GLUCOSE 146 (H) 08/02/2019 0901   BUN 8 08/02/2019 0901   CREATININE 0.70 08/02/2019 0901   CALCIUM 8.8 (L) 08/02/2019 0901   PROT 6.1 (L) 08/02/2019 0901   ALBUMIN 3.8 08/02/2019 0901   AST 16 08/02/2019 0901   ALT 28 08/02/2019 0901   ALKPHOS 96 08/02/2019 0901   BILITOT 0.2 (L) 08/02/2019 0901   GFRNONAA >60 08/02/2019 0901   GFRAA >60 08/02/2019 0901    No results found for: TOTALPROTELP, ALBUMINELP, A1GS, A2GS, BETS, BETA2SER, GAMS, MSPIKE, SPEI  Lab Results  Component Value Date   WBC 8.9 08/02/2019   NEUTROABS 6.7 08/02/2019   HGB 10.4 (L) 08/02/2019   HCT 31.2 (L) 08/02/2019   MCV 94.3 08/02/2019   PLT 78 (L) 08/02/2019    No results found for: LABCA2  No components found for: EHMCNO709  No results for input(s): INR in the last 168 hours.  No results found for: LABCA2  No results found for: GGE366  No results found for: QHU765  No results found for: YYT035  No results found for: CA2729  No components found for: HGQUANT  No results found for: CEA1 / No results found for: CEA1   No results found for: AFPTUMOR  No results found for: CHROMOGRNA  No results found for: KPAFRELGTCHN, LAMBDASER, KAPLAMBRATIO (kappa/lambda light chains)  No results found for: HGBA, HGBA2QUANT, HGBFQUANT, HGBSQUAN (Hemoglobinopathy evaluation)   No results found for: LDH  No results found for: IRON, TIBC, IRONPCTSAT (Iron and TIBC)  No results found for: FERRITIN  Urinalysis No results found for: COLORURINE, APPEARANCEUR, LABSPEC, PHURINE, GLUCOSEU, HGBUR, BILIRUBINUR, KETONESUR, PROTEINUR, UROBILINOGEN, NITRITE, LEUKOCYTESUR   STUDIES: US BREAST LTD UNI LEFT INC AXILLA  Result Date: 07/13/2019 CLINICAL DATA:  59 year old with recent diagnosis of grade 3  invasive ductal carcinoma involving the LOWER INNER QUADRANT of the LEFT breast (ER positive, PR negative, HER 2 Neu negative, Ki67 80%). The patient has had 1 dose of neoadjuvant chemotherapy. Pretreatment MRI demonstrated potential clustered masses immediately adjacent and LATERAL to the biopsy-proven malignancy, a  7 mm mass in the LOWER breast near 6 o'clock location, and linear non-mass enhancement in the UPPER breast at MIDDLE depth. Second-look ultrasound is performed to determine if there is a potential sonographic correlate for the masses in the LOWER breast. EXAM: ULTRASOUND OF THE LEFT BREAST COMPARISON:  MRI breast 06/28/2025. The prior LEFT breast ultrasound 05/30/2019 is compared, but was performed only in the [facility]. FINDINGS: Targeted ultrasound is performed, showing no sonographic correlate for the MRI findings in the LOWER breast. Specifically, the discrete 7 mm mass at the near 6 o'clock location was not identified. I documented the mass in the [place] with the associated biopsy marker clip. IMPRESSION: No sonographic correlate for the MRI findings in the LEFT breast. RECOMMENDATION: MRI biopsy of the linear non-mass enhancement in the UPPER LEFT breast and the 7 mm nodule in the LOWER LEFT breast which will assist in determining the extent of disease (as these 2 areas are the farthest apart). I have discussed the findings and recommendations with the patient. The patient will be contacted in order to schedule the MRI biopsy. BI-RADS CATEGORY  6: Known biopsy-proven malignancy. Electronically Signed   By: Evangeline Dakin M.D.   On: 07/13/2019 09:49   MM CLIP PLACEMENT LEFT  Result Date: 07/22/2019 CLINICAL DATA:  Two MRI guided biopsies were recommended of the left breast and performed today. The patient has a recent personal history left breast cancer (containing a ribbon shaped biopsy clip) diagnosed in the lower inner quadrant biopsied under ultrasound guidance.  EXAM: DIAGNOSTIC LEFT MAMMOGRAM POST MRI BIOPSIES (2) COMPARISON:  Previous exam(s). FINDINGS: Mammographic images were obtained following MRI guided biopsies of clumped linear enhancement in the upper central left breast and of a 0.7 cm mass in the inferior central left breast. The barbell biopsy clip is satisfactorily positioned in the upper central/slightly lateral left breast in the expected location of the MRI guided biopsy. The cylinder shaped biopsy clip is in the expected location of the biopsied mass in the inferior central left breast. The barbell shaped biopsy clip is approximately 3 cm superior to the ribbon shaped biopsy clip in the known cancer on the 90 degree lateral projection. On the cc view, the barbell shaped biopsy clip is 5 cm lateral to the ribbon shaped biopsy clip within the known cancer. IMPRESSION: Appropriate positioning of the barbell and cylinder shaped biopsy marking clips in the left breast. Final Assessment: Post Procedure Mammograms for Marker Placement Electronically Signed   By: Curlene Dolphin M.D.   On: 07/22/2019 10:23   MR LT BREAST BX W LOC DEV 1ST LESION IMAGE BX SPEC MR GUIDE  Addendum Date: 07/25/2019   ADDENDUM REPORT: 07/25/2019 14:26 ADDENDUM: Pathology revealed COMPLEX SCLEROSING LESION WITH CALCIFICATIONS, FIBROADENOMATOID CHANGES of the Left breast, upper central (barbell clip). This was found to be concordant by Dr. Curlene Dolphin, with excision recommended. Pathology revealed FOCAL USUAL DUCTAL HYPERPLASIA, COLUMNAR AND FIBROCYSTIC CELL CHANGES of the Left breast, inferior central (cylinder clip). This was found to be concordant by Dr. Curlene Dolphin. Pathology results were discussed with the patient by telephone. The patient reported doing well after the biopsies with tenderness at the sites. Post biopsy instructions and care were reviewed and questions were answered. The patient was encouraged to call [facility] for any additional  concerns. My direct phone number was provided for the patient. The patient has a recent diagnosis of Left breast cancer and should follow her outlined treatment plan.  Dr. Autumn Messing and Dr. Gunnar Bulla [name] were notified of biopsy results via EPIC message on July 25, 2019. Pathology results reported by Terie Purser, RN on 07/25/2019. Electronically Signed   By: Curlene Dolphin M.D.   On: 07/25/2019 14:26   Result Date: 07/25/2019 CLINICAL DATA:  Two MRI guided biopsies of the left breast were recommended to evaluate clumped linear enhancement in the upper central left breast and a 0.7 cm enhancing mass in the inferior central left breast. The patient has a recent diagnosis of a cancer in the lower inner quadrant of the left breast, containing a ribbon shaped biopsy clip. She has begun neoadjuvant chemotherapy. EXAM: MRI GUIDED CORE NEEDLE BIOPSY OF THE LEFT BREAST X 2 TECHNIQUE: Multiplanar, multisequence MR imaging of the left breast was performed both before and after administration of intravenous contrast. CONTRAST:  52m GADAVIST GADOBUTROL 1 MMOL/ML IV SOLN COMPARISON:  Previous exams. FINDINGS: I met with the patient, and we discussed the procedure of MRI guided biopsy, including risks, benefits, and alternatives. Specifically, we discussed the risks of infection, bleeding, tissue injury, clip migration, and inadequate sampling. Informed, written consent was given. The usual time out protocol was performed immediately prior to the procedure. Using sterile technique, 1% Lidocaine, MRI guidance, and a 9 gauge vacuum assisted device, biopsy was performed of clumped linear enhancement in the upper central left breast using a lateral approach. At the conclusion of the procedure, a barbell tissue marker clip was deployed into the biopsy cavity. Follow-up 2-view mammogram was performed and dictated separately. Using sterile technique, 1% Lidocaine, MRI guidance, and a 9 gauge vacuum assisted device, biopsy was performed of  0.7 cm mass in the inferior central left breast using a lateral approach. At the conclusion of the procedure, a cylinder tissue marker clip was deployed into the biopsy cavity. Follow-up 2-view mammogram was performed and dictated separately. IMPRESSION: Two MRI guided biopsies of the left breast. No apparent complications. Electronically Signed: By: SCurlene DolphinM.D. On: 07/22/2019 10:26   MR LT BREAST BX W LOC DEV EA ADD LESION IMAGE BX SPEC MR GUIDE  Addendum Date: 07/25/2019   ADDENDUM REPORT: 07/25/2019 14:26 ADDENDUM: Pathology revealed COMPLEX SCLEROSING LESION WITH CALCIFICATIONS, FIBROADENOMATOID CHANGES of the Left breast, upper central (barbell clip). This was found to be concordant by Dr. SCurlene Dolphin with excision recommended. Pathology revealed FOCAL USUAL DUCTAL HYPERPLASIA, COLUMNAR AND FIBROCYSTIC CELL CHANGES of the Left breast, inferior central (cylinder clip). This was found to be concordant by Dr. SCurlene Dolphin Pathology results were discussed with the patient by telephone. The patient reported doing well after the biopsies with tenderness at the sites. Post biopsy instructions and care were reviewed and questions were answered. The [facility] My direct phone number was provided for the patient. The patient has a recent diagnosis of Left breast cancer and should follow her outlined treatment plan. Dr. PAutumn Messingand Dr. GGunnar BullaMagrinat were notified of biopsy results via EPIC message on July 25, 2019. Pathology results reported by LTerie Purser RN on 07/25/2019. Electronically Signed   By: SCurlene DolphinM.D.   On: 07/25/2019 14:26   Result Date: 07/25/2019 CLINICAL DATA:  Two MRI guided biopsies of the left breast were recommended to evaluate clumped linear enhancement in the upper central left breast and a 0.7 cm enhancing mass in the inferior central left breast. The patient has a recent diagnosis of a  cancer in the  lower inner quadrant of the left breast, containing a ribbon shaped biopsy clip. She has begun neoadjuvant chemotherapy. EXAM: MRI GUIDED CORE NEEDLE BIOPSY OF THE LEFT BREAST X 2 TECHNIQUE: Multiplanar, multisequence MR imaging of the left breast was performed both before and after administration of intravenous contrast. CONTRAST:  67m GADAVIST GADOBUTROL 1 MMOL/ML IV SOLN COMPARISON:  Previous exams. FINDINGS: I met with the patient, and we discussed the procedure of MRI guided biopsy, including risks, benefits, and alternatives. Specifically, we discussed the risks of infection, bleeding, tissue injury, clip migration, and inadequate sampling. Informed, written consent was given. The usual time out protocol was performed immediately prior to the procedure. Using sterile technique, 1% Lidocaine, MRI guidance, and a 9 gauge vacuum assisted device, biopsy was performed of clumped linear enhancement in the upper central left breast using a lateral approach. At the conclusion of the procedure, a barbell tissue marker clip was deployed into the biopsy cavity. Follow-up 2-view mammogram was performed and dictated separately. Using sterile technique, 1% Lidocaine, MRI guidance, and a 9 gauge vacuum assisted device, biopsy was performed of 0.7 cm mass in the inferior central left breast using a lateral approach. At the conclusion of the procedure, a cylinder tissue marker clip was deployed into the biopsy cavity. Follow-up 2-view mammogram was performed and dictated separately. IMPRESSION: Two MRI guided biopsies of the left breast. No apparent complications. Electronically Signed: By: SCurlene DolphinM.D. On: 07/22/2019 10:26     ELIGIBLE FOR AVAILABLE RESEARCH PROTOCOL: no  ASSESSMENT: 59y.o. [place] woman status post left breast lower inner quadrant biopsy 06/01/2019 for a clinical T1c N0, stage IB invasive ductal carcinoma, grade 3, weakly estrogen receptor positive but functionally triple  negative, with an MIB-1 of 80%.  (1) Oncotype score of 51 predicts a risk of recurrence outside the breast in the next 9 years of greater than 39% if the patient's only systemic therapy is antiestrogens for 5 years.  It also predicts a greater than 15% benefit from chemotherapy  (2) neoadjuvant chemotherapy will consist of doxorubicin and cyclophosphamide in dose dense fashion x4 beginning 06/30/2019, to be followed by weekly paclitaxel and carboplatin weekly x12  (3) definitive surgery pending  (4) adjuvant radiation to follow: Consider capecitabine sensitization   PLAN: KMaudie Mercurymet with myself and Dr. MJana Hakimtoday.  Due to her platelets being 78, we will delay her chemotherapy x 1 week.  She will not receive treatment today and she is ok with this.  She is going to take this week and continue walking and recovering, particularly in regards to her energy level.    We did briefly discuss her next chemotherapy regimen with weekly Paclitaxel and the anti nausea regimen.  KZharahas an appointment with Joli in nutrition this afternoon.  I have asked that my nurse LMickel Baascall her to see if she wants patient to come downstairs early, or if she wants to call the patient at her appointment time.    KTieawill see uKoreanext week for labs, f/u, and her third cycle of neoadjuvant chemotherapy.  She knows to call for any questions that may arise between now and her next appointment.  We are happy to see her sooner if needed.   Total encounter time 20 minutes.*Wilber Bihari NP 08/02/19 10:15 AM Medical Oncology [facility] H[facility] [place] [address]Tel. [phone]   Fax. [phone]  ADDENDUM: KMiashahas tolerated treatment remarkably well.  At this  point her bone marrow is telling us that it needs a breathing space.  I reassured her that taking a week off is not going to affect her long-term prognosis.  We will resume therapy next week.  At this point I'm  very encouraged that she is having a good initial response and we're hoping for a complete pathologic response once she gets to definitive surgery.  I personally saw this patient and performed a substantive portion of this encounter with the listed APP documented above.   Chauncey Cruel, MD Medical Oncology and Hematology [facility] ([place]) [address] [place], [place] [address] Tel. [phone]    Fax. [phone]      *Total Encounter Time as defined by the Centers for Medicare and Medicaid Services includes, in addition to the face-to-face time of a patient visit (documented in the note above) non-face-to-face time: obtaining and reviewing outside history, ordering and reviewing medications, tests or procedures, care coordination (communications with other health care professionals or caregivers) and documentation in the medical record.

## 2019-08-04 ENCOUNTER — Ambulatory Visit: Payer: Managed Care, Other (non HMO)

## 2019-08-04 ENCOUNTER — Ambulatory Visit: Payer: 59

## 2019-08-04 ENCOUNTER — Telehealth: Payer: Self-pay | Admitting: Adult Health

## 2019-08-04 NOTE — Telephone Encounter (Signed)
Scheduled appts per 7/20 los. Pt confirmed appt dates and times

## 2019-08-05 NOTE — Progress Notes (Signed)
Pharmacist Chemotherapy Monitoring - Follow Up Assessment    I verify that I have reviewed each item in the below checklist:  . Regimen for the patient is scheduled for the appropriate day and plan matches scheduled date. [name] Appropriate non-routine labs are ordered dependent on drug ordered. . If applicable, additional medications reviewed and ordered per protocol based on lifetime cumulative doses and/or treatment regimen.   Plan for follow-up and/or issues identified: Yes . I-vent associated with next due treatment: Yes . MD and/or nursing notified: No   Kennith Center, Pharm.D., CPP 08/05/2019@5 :22 PM

## 2019-08-07 NOTE — Progress Notes (Signed)
[place]  [phone] [phone]     ID: Kathy Barker DOB: 02-May-1960  MR#: [id number]  HKV#:[id number]  Patient Care Team: [name], MD as PCP - General (Family Medicine) Rockwell Germany, RN as Oncology Nurse Navigator Mauro Kaufmann, RN as Oncology Nurse Navigator [name], MD as Consulting Physician (General Surgery) [name], [name], MD as Consulting Physician (Oncology) [name], MD as Attending Physician (Radiation Oncology) Lavonna Monarch, MD as Consulting Physician (Dermatology) Scot Dock, NP OTHER MD:  CHIEF COMPLAINT: Left-sided breast cancer  CURRENT TREATMENT: Neoadjuvant chemotherapy   INTERVAL HISTORY: Kathy Barker returns today for follow up and treatment of her left-sided breast cancer.   She was started on neoadjvuant chematoherapy with doxorubicin and cyclophosphamide given on day 1 of a 14 day schedule, with Udenyca support on day 3. Today is day 1 cycle 3.  Her treatment was held last week due to a platelet count of 78.    Today her platelet count has improved and her fatigue has as well.  She notes she is Barker to finish out her last two of this regimen and go onto weekly Paclitaxel.     REVIEW OF SYSTEMS: Kathy Barker is feeling well.  Her only complaint is that she was up and around doing things this past week during her break and pulled something in her back.  She denies any bowel/bladder changes, weakness, radiating pain, fever, chills, cough, nausea, vomiting, mucositis, shortness of breath, chest pain or palpitations.  A detailed ROS was otherwise non contributory.     HISTORY OF CURRENT ILLNESS: From the original intake note:  Kathy Barker herself noted a palpable lower-inner left breast lump and immediately brought it to medical attention.. She underwent bilateral diagnostic mammography with tomography and left breast ultrasonography at [facility] on 05/30/2019 showing: breast density category B; 1.7 cm  lobulated mass in left breast at 8 o'clock; no significant left axillary abnormalities.  Accordingly on 06/01/2019 she proceeded to biopsy of the left breast area in question. The pathology from this procedure ([id number]) showed: invasive ductal carcinoma, grade 3. Prognostic indicators significant for: estrogen receptor, 70% positive with weak staining intensity and progesterone receptor, 0% negative. Proliferation marker Ki67 at 80%. HER2 negative by immunohistochemistry (1+).  The patient's subsequent history is as detailed below.   PAST MEDICAL HISTORY: Past Medical History:  Diagnosis Date  . Allergic rhinitis   . Anxiety    "only during my divorce" (11/18/2012)  . Cancer ([place])   . Depression    "only during my divorce" (11/18/2012)  . GERD (gastroesophageal reflux disease)   . High cholesterol   . Hot flashes   . IBS (irritable bowel syndrome)   . Insomnia   . Migraines    "lately more often; before SVT I'd get them ~ q 6 months" (11/18/2012)  . PONV (postoperative nausea and vomiting)   . 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 CESAREAN SECTION  1992  . COMBINED HYSTERECTOMY VAGINAL / OOPHORECTOMY / A&P REPAIR  1999  . IR IMAGING GUIDED PORT INSERTION  06/30/2019  . 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 SUPRAVENTRICULAR TACHYCARDIA ABLATION N/A 11/18/2012   Procedure: SUPRAVENTRICULAR TACHYCARDIA ABLATION;  Surgeon: Coralyn Mark, MD;  Location: [facility] CATH [facility];  Service: Cardiovascular;  Laterality:  N/A;  . VAGINAL HYSTERECTOMY  ~ 2000    FAMILY HISTORY: Family History  Problem Relation Age of Onset  . Heart attack Father   . Alcohol abuse Father   . Heart attack Paternal Grandfather   . Heart Problems Other        both sides of family  . Breast cancer Mother     Her father died at age 74 from alcohol abuse. Her mother is living at age 54 (as of 05/2019) and has a history of DCIS at age 85 and invasive lobular breast cancer at age 63. Kathy Barker has one brother. She reports cancer of an unknown type in her maternal grandmother and prostate cancer in a maternal uncle.   GYNECOLOGIC HISTORY:  No LMP recorded. Patient has had a hysterectomy. Menarche: 59 years old Age at first live birth: 59 years old [place] P 2 LMP 2000 Contraceptive: never used HRT never used  Hysterectomy? yes BSO? 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  She will be on temporary disability during her chemotherapy.  She is divorced. She lives at home with son Kathy Barker, age 42, who works in Biomedical scientist in [place]. Son Kathy Barker, age 62, works in Sales executive here in [place]. 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     ADVANCED DIRECTIVES: Not in place. She intends to name both of her sons as her 74.   HEALTH MAINTENANCE: Social History   Tobacco Use  . Smoking status: Never Smoker  . Smokeless tobacco: Never Used  Vaping Use  . Vaping Use: Never used  Substance Use Topics  . Alcohol use: Yes    Comment: 11/18/2012 "shot of brandy couple times/month"  . Drug use: No     Colonoscopy: yes, date unsure  PAP: date unsure  Bone density: never done   Allergies  Allergen Reactions  . Fentanyl Nausea And Vomiting    Per patient " extreme nausea and vomiting "   . Other     Problems with stitches  . Versed [Midazolam] Nausea And Vomiting    Per patient " extreme N/V"   . Latex Rash    Current Outpatient Medications  Medication Sig Dispense Refill  . cetirizine (ZYRTEC) 10 MG tablet Take 10 mg by mouth daily.    . ciprofloxacin (CIPRO) 500 MG tablet Take 1 tablet (500 mg total) by mouth 2 (two) times daily. 10 tablet 0  . dexamethasone (DECADRON) 4 MG tablet Take 2 tablets by mouth twice a  day starting on the day after chemotherapy (day 2), repeat the next day (day 3) then take 2 tablets in AM only day 4, then stop.  Take with food. 30 tablet 1  . lidocaine-prilocaine (EMLA) cream Apply to affected area once 30 g 3  . LIVALO 4 MG TABS Take 1 tablet by mouth daily.    [name] loratadine (CLARITIN) 10 MG tablet Take 1 tablet (10 mg total) by mouth daily. 60 tablet 3  . LORazepam (ATIVAN) 0.5 MG tablet Use 1 tab daily as needed for anxiety and or nausea. 30 tablet 0  . omeprazole (PRILOSEC) 40 MG capsule Take one capsule daily for 5 days starting on chemo day, then daily as needed 60 capsule 3  . prochlorperazine (COMPAZINE) 10 MG tablet Take 1 tablet (10 mg total) by mouth every 6 (six) hours as needed (Nausea or vomiting). 30 tablet 1  . sertraline (ZOLOFT) 100 MG tablet Take 100  mg by mouth daily.    . SYMBICORT 160-4.5 MCG/ACT inhaler Inhale 2 puffs into the lungs 2 (two) times daily.    . temazepam (RESTORIL) 15 MG capsule Take 15 mg by mouth at bedtime.     No current facility-administered medications for this visit.    OBJECTIVE: White woman in no acute distress Patient seen in infusion, review vitals in [facility] There were no vitals filed for this visit.   There is no height or weight on file to calculate BMI.   Wt Readings from Last 3 Encounters:  08/02/19 132 lb 8 oz (60.1 kg)  07/26/19 131 lb 9.6 oz (59.7 kg)  07/19/19 131 lb (59.4 kg)      ECOG FS:1 - Symptomatic but completely ambulatory  GENERAL: Patient is a well appearing female in no acute distress HEENT:  Sclerae anicteric. Mask in place. Neck is supple.  NODES:  No cervical, supraclavicular, or axillary lymphadenopathy palpated.  BREAST EXAM:  Deferred. LUNGS:  Clear to auscultation bilaterally.  No wheezes or rhonchi. HEART:  Regular rate and rhythm. No murmur appreciated. ABDOMEN:  Soft, nontender.  Positive, normoactive bowel sounds. No organomegaly palpated. MSK:  No focal spinal tenderness to palpation. Full  range of motion bilaterally in the upper extremities. EXTREMITIES:  No peripheral edema.   SKIN:  Clear with no obvious rashes or skin changes. No nail dyscrasia. NEURO:  Nonfocal. Well oriented.  Appropriate affect.     LAB RESULTS:  CMP     Component Value Date/Time   NA 143 08/02/2019 0901   K 3.6 08/02/2019 0901   CL 110 08/02/2019 0901   CO2 23 08/02/2019 0901   GLUCOSE 146 (H) 08/02/2019 0901   BUN 8 08/02/2019 0901   CREATININE 0.70 08/02/2019 0901   CALCIUM 8.8 (L) 08/02/2019 0901   PROT 6.1 (L) 08/02/2019 0901   ALBUMIN 3.8 08/02/2019 0901   AST 16 08/02/2019 0901   ALT 28 08/02/2019 0901   ALKPHOS 96 08/02/2019 0901   BILITOT 0.2 (L) 08/02/2019 0901   GFRNONAA >60 08/02/2019 0901   GFRAA >60 08/02/2019 0901    No results found for: TOTALPROTELP, ALBUMINELP, A1GS, A2GS, BETS, BETA2SER, GAMS, MSPIKE, SPEI  Lab Results  Component Value Date   WBC 8.9 08/02/2019   NEUTROABS 6.7 08/02/2019   HGB 10.4 (L) 08/02/2019   HCT 31.2 (L) 08/02/2019   MCV 94.3 08/02/2019   PLT 78 (L) 08/02/2019    No results found for: LABCA2  No components found for: NLGXQJ194  No results for input(s): INR in the last 168 hours.  No results found for: LABCA2  No results found for: RDE081  No results found for: KGY185  No results found for: UDJ497  No results found for: CA2729  No components found for: HGQUANT  No results found for: CEA1 / No results found for: CEA1   No results found for: AFPTUMOR  No results found for: CHROMOGRNA  No results found for: KPAFRELGTCHN, LAMBDASER, KAPLAMBRATIO (kappa/lambda light chains)  No results found for: HGBA, HGBA2QUANT, HGBFQUANT, HGBSQUAN (Hemoglobinopathy evaluation)   No results found for: LDH  No results found for: IRON, TIBC, IRONPCTSAT (Iron and TIBC)  No results found for: FERRITIN  Urinalysis No results found for: COLORURINE, APPEARANCEUR, LABSPEC, PHURINE, GLUCOSEU, HGBUR, BILIRUBINUR, KETONESUR, PROTEINUR,  UROBILINOGEN, NITRITE, LEUKOCYTESUR   STUDIES: US BREAST LTD UNI LEFT INC AXILLA  Result Date: 07/13/2019 CLINICAL DATA:  59 year old with recent diagnosis of grade 3 invasive ductal carcinoma involving the LOWER INNER QUADRANT of  the LEFT breast (ER positive, PR negative, HER 2 Neu negative, Ki67 80%). The patient has had 1 dose of neoadjuvant chemotherapy. Pretreatment MRI demonstrated potential clustered masses immediately adjacent and LATERAL to the biopsy-proven malignancy, a 7 mm mass in the LOWER breast near 6 o'clock location, and linear non-mass enhancement in the UPPER breast at MIDDLE depth. Second-look ultrasound is performed to determine if there is a potential sonographic correlate for the masses in the LOWER breast. EXAM: ULTRASOUND OF THE LEFT BREAST COMPARISON:  MRI breast 06/28/2025. The prior LEFT breast ultrasound 05/30/2019 is compared, but was performed only in the [facility]. FINDINGS: Targeted ultrasound is performed, showing no sonographic correlate for the MRI findings in the LOWER breast. Specifically, the discrete 7 mm mass at the near 6 o'clock location was not identified. I documented the mass in the [place] with the associated biopsy marker clip. IMPRESSION: No sonographic correlate for the MRI findings in the LEFT breast. RECOMMENDATION: MRI biopsy of the linear non-mass enhancement in the UPPER LEFT breast and the 7 mm nodule in the LOWER LEFT breast which will assist in determining the extent of disease (as these 2 areas are the farthest apart). I have discussed the findings and recommendations with the patient. The patient will be contacted in order to schedule the MRI biopsy. BI-RADS CATEGORY  6: Known biopsy-proven malignancy. Electronically Signed   By: Evangeline Dakin M.D.   On: 07/13/2019 09:49   MM CLIP PLACEMENT LEFT  Result Date: 07/22/2019 CLINICAL DATA:  Two MRI guided biopsies were recommended of the left breast and performed today. The  patient has a recent personal history left breast cancer (containing a ribbon shaped biopsy clip) diagnosed in the lower inner quadrant biopsied under ultrasound guidance. EXAM: DIAGNOSTIC LEFT MAMMOGRAM POST MRI BIOPSIES (2) COMPARISON:  Previous exam(s). FINDINGS: Mammographic images were obtained following MRI guided biopsies of clumped linear enhancement in the upper central left breast and of a 0.7 cm mass in the inferior central left breast. The barbell biopsy clip is satisfactorily positioned in the upper central/slightly lateral left breast in the expected location of the MRI guided biopsy. The cylinder shaped biopsy clip is in the expected location of the biopsied mass in the inferior central left breast. The barbell shaped biopsy clip is approximately 3 cm superior to the ribbon shaped biopsy clip in the known cancer on the 90 degree lateral projection. On the cc view, the barbell shaped biopsy clip is 5 cm lateral to the ribbon shaped biopsy clip within the known cancer. IMPRESSION: Appropriate positioning of the barbell and cylinder shaped biopsy marking clips in the left breast. Final Assessment: Post Procedure Mammograms for Marker Placement Electronically Signed   By: Curlene Dolphin M.D.   On: 07/22/2019 10:23   MR LT BREAST BX W LOC DEV 1ST LESION IMAGE BX SPEC MR GUIDE  Addendum Date: 07/25/2019   ADDENDUM REPORT: 07/25/2019 14:26 ADDENDUM: Pathology revealed COMPLEX SCLEROSING LESION WITH CALCIFICATIONS, FIBROADENOMATOID CHANGES of the Left breast, upper central (barbell clip). This was found to be concordant by Dr. Curlene Dolphin, with excision recommended. Pathology revealed FOCAL USUAL DUCTAL HYPERPLASIA, COLUMNAR AND FIBROCYSTIC CELL CHANGES of the Left breast, inferior central (cylinder clip). This was found to be concordant by Dr. Curlene Dolphin. Pathology results were discussed with the patient by telephone. The patient reported doing well after the biopsies with tenderness at the sites. Post  biopsy instructions and care were reviewed and questions were answered. The patient was encouraged to  call [facility] for any additional concerns. My direct phone number was provided for the patient. The patient has a recent diagnosis of Left breast cancer and should follow her outlined treatment plan. Dr. Autumn Messing and Dr. Gunnar Bulla [name] were notified of biopsy results via EPIC message on July 25, 2019. Pathology results reported by Terie Purser, RN on 07/25/2019. Electronically Signed   By: Curlene Dolphin M.D.   On: 07/25/2019 14:26   Result Date: 07/25/2019 CLINICAL DATA:  Two MRI guided biopsies of the left breast were recommended to evaluate clumped linear enhancement in the upper central left breast and a 0.7 cm enhancing mass in the inferior central left breast. The patient has a recent diagnosis of a cancer in the lower inner quadrant of the left breast, containing a ribbon shaped biopsy clip. She has begun neoadjuvant chemotherapy. EXAM: MRI GUIDED CORE NEEDLE BIOPSY OF THE LEFT BREAST X 2 TECHNIQUE: Multiplanar, multisequence MR imaging of the left breast was performed both before and after administration of intravenous contrast. CONTRAST:  17m GADAVIST GADOBUTROL 1 MMOL/ML IV SOLN COMPARISON:  Previous exams. FINDINGS: I met with the patient, and we discussed the procedure of MRI guided biopsy, including risks, benefits, and alternatives. Specifically, we discussed the risks of infection, bleeding, tissue injury, clip migration, and inadequate sampling. Informed, written consent was given. The usual time out protocol was performed immediately prior to the procedure. Using sterile technique, 1% Lidocaine, MRI guidance, and a 9 gauge vacuum assisted device, biopsy was performed of clumped linear enhancement in the upper central left breast using a lateral approach. At the conclusion of the procedure, a barbell tissue marker clip was deployed into the biopsy cavity. Follow-up  2-view mammogram was performed and dictated separately. Using sterile technique, 1% Lidocaine, MRI guidance, and a 9 gauge vacuum assisted device, biopsy was performed of 0.7 cm mass in the inferior central left breast using a lateral approach. At the conclusion of the procedure, a cylinder tissue marker clip was deployed into the biopsy cavity. Follow-up 2-view mammogram was performed and dictated separately. IMPRESSION: Two MRI guided biopsies of the left breast. No apparent complications. Electronically Signed: By: SCurlene DolphinM.D. On: 07/22/2019 10:26   MR LT BREAST BX W LOC DEV EA ADD LESION IMAGE BX SPEC MR GUIDE  Addendum Date: 07/25/2019   ADDENDUM REPORT: 07/25/2019 14:26 ADDENDUM: Pathology revealed COMPLEX SCLEROSING LESION WITH CALCIFICATIONS, FIBROADENOMATOID CHANGES of the Left breast, upper central (barbell clip). This was found to be concordant by Dr. SCurlene Dolphin with excision recommended. Pathology revealed FOCAL USUAL DUCTAL HYPERPLASIA, COLUMNAR AND FIBROCYSTIC CELL CHANGES of the Left breast, inferior central (cylinder clip). This was found to be concordant by Dr. SCurlene Dolphin Pathology results were discussed with the patient by telephone. The patient reported doing well after the biopsies with tenderness at the sites. Post biopsy instructions and care were reviewed and questions were answered. [facility] My direct phone number was provided for the patient. The patient has a recent diagnosis of Left breast cancer and should follow her outlined treatment plan. Dr. PAutumn Messingand Dr. GGunnar BullaMagrinat were notified of biopsy results via EPIC message on July 25, 2019. Pathology results reported by LTerie Purser RN on 07/25/2019. Electronically Signed   By: SCurlene DolphinM.D.   On: 07/25/2019 14:26   Result Date: 07/25/2019 CLINICAL DATA:  Two MRI guided biopsies of the left breast  were recommended to  evaluate clumped linear enhancement in the upper central left breast and a 0.7 cm enhancing mass in the inferior central left breast. The patient has a recent diagnosis of a cancer in the lower inner quadrant of the left breast, containing a ribbon shaped biopsy clip. She has begun neoadjuvant chemotherapy. EXAM: MRI GUIDED CORE NEEDLE BIOPSY OF THE LEFT BREAST X 2 TECHNIQUE: Multiplanar, multisequence MR imaging of the left breast was performed both before and after administration of intravenous contrast. CONTRAST:  76m GADAVIST GADOBUTROL 1 MMOL/ML IV SOLN COMPARISON:  Previous exams. FINDINGS: I met with the patient, and we discussed the procedure of MRI guided biopsy, including risks, benefits, and alternatives. Specifically, we discussed the risks of infection, bleeding, tissue injury, clip migration, and inadequate sampling. Informed, written consent was given. The usual time out protocol was performed immediately prior to the procedure. Using sterile technique, 1% Lidocaine, MRI guidance, and a 9 gauge vacuum assisted device, biopsy was performed of clumped linear enhancement in the upper central left breast using a lateral approach. At the conclusion of the procedure, a barbell tissue marker clip was deployed into the biopsy cavity. Follow-up 2-view mammogram was performed and dictated separately. Using sterile technique, 1% Lidocaine, MRI guidance, and a 9 gauge vacuum assisted device, biopsy was performed of 0.7 cm mass in the inferior central left breast using a lateral approach. At the conclusion of the procedure, a cylinder tissue marker clip was deployed into the biopsy cavity. Follow-up 2-view mammogram was performed and dictated separately. IMPRESSION: Two MRI guided biopsies of the left breast. No apparent complications. Electronically Signed: By: SCurlene DolphinM.D. On: 07/22/2019 10:26     ELIGIBLE FOR AVAILABLE RESEARCH PROTOCOL: no  ASSESSMENT: 59y.o. Kathy Barker woman status post left  breast lower inner quadrant biopsy 06/01/2019 for a clinical T1c N0, stage IB invasive ductal carcinoma, grade 3, weakly estrogen receptor positive but functionally triple negative, with an MIB-1 of 80%.  (1) Oncotype score of 51 predicts a risk of recurrence outside the breast in the next 9 years of greater than 39% if the patient's only systemic therapy is antiestrogens for 5 years.  It also predicts a greater than 15% benefit from chemotherapy  (2) neoadjuvant chemotherapy will consist of doxorubicin and cyclophosphamide in dose dense fashion x4 beginning 06/30/2019, to be followed by weekly paclitaxel and carboplatin weekly x12  (3) definitive surgery pending  (4) adjuvant radiation to follow: Consider capecitabine sensitization   PLAN: KJamelacontinues on treatment with neoadjuvant doxorubicin and cyclophsophamide.  She has had a week break since her plt count had declined, and since her plt count has recovered, along with her energy and she is feeling well.  Her labs are within parameters to receive treatment today.  She will receive Onpro today, so will not need to return for injection.  I recommended she continue with healthy diet and exercise.  She is doing this.  We will see KTyrellback in 2 weeks for labs, f/u, and her next appointment.  She knows to call for any questions that may arise between now and her next appointment.  We are happy to see her sooner if needed.   Total encounter time 20 minutes.*Wilber Bihari NP 08/07/19 9:04 AM Medical Oncology and Hematology [facility] [place] [id number]. [phone]   Fax. [phone]   *Total Encounter Time as defined by the Centers for Medicare and Medicaid Services includes, in addition  to the face-to-face time of a patient visit (documented in the note above) non-face-to-face time: obtaining and reviewing outside history, ordering and reviewing medications, tests or procedures, care  coordination (communications with other health care professionals or caregivers) and documentation in the medical record.

## 2019-08-08 ENCOUNTER — Other Ambulatory Visit: Payer: Self-pay | Admitting: *Deleted

## 2019-08-08 DIAGNOSIS — C50312 Malignant neoplasm of lower-inner quadrant of left female breast: Secondary | ICD-10-CM

## 2019-08-09 ENCOUNTER — Other Ambulatory Visit: Payer: Self-pay

## 2019-08-09 ENCOUNTER — Other Ambulatory Visit: Payer: 59

## 2019-08-09 ENCOUNTER — Other Ambulatory Visit: Payer: Self-pay | Admitting: Oncology

## 2019-08-09 ENCOUNTER — Inpatient Hospital Stay: Payer: 59

## 2019-08-09 ENCOUNTER — Inpatient Hospital Stay (HOSPITAL_BASED_OUTPATIENT_CLINIC_OR_DEPARTMENT_OTHER): Payer: 59 | Admitting: Adult Health

## 2019-08-09 ENCOUNTER — Encounter: Payer: Self-pay | Admitting: Adult Health

## 2019-08-09 ENCOUNTER — Encounter: Payer: Self-pay | Admitting: Oncology

## 2019-08-09 VITALS — BP 128/68 | HR 86 | Temp 98.6°F | Resp 18 | Wt 136.0 lb

## 2019-08-09 DIAGNOSIS — C50312 Malignant neoplasm of lower-inner quadrant of left female breast: Secondary | ICD-10-CM

## 2019-08-09 DIAGNOSIS — Z5111 Encounter for antineoplastic chemotherapy: Secondary | ICD-10-CM | POA: Diagnosis not present

## 2019-08-09 DIAGNOSIS — Z170 Estrogen receptor positive status [ER+]: Secondary | ICD-10-CM

## 2019-08-09 DIAGNOSIS — Z95828 Presence of other vascular implants and grafts: Secondary | ICD-10-CM

## 2019-08-09 LAB — CMP (CANCER CENTER ONLY)
ALT: 34 U/L (ref 0–44)
AST: 21 U/L (ref 15–41)
Albumin: 3.9 g/dL (ref 3.5–5.0)
Alkaline Phosphatase: 79 U/L (ref 38–126)
Anion gap: 9 (ref 5–15)
BUN: 6 mg/dL (ref 6–20)
CO2: 25 mmol/L (ref 22–32)
Calcium: 9.6 mg/dL (ref 8.9–10.3)
Chloride: 110 mmol/L (ref 98–111)
Creatinine: 0.69 mg/dL (ref 0.44–1.00)
GFR, Est AFR Am: >60 mL/min (ref >60)
GFR, Estimated: >60 mL/min (ref >60)
Glucose, Bld: 105 mg/dL — ABNORMAL HIGH (ref 70–99)
Potassium: 4.1 mmol/L (ref 3.5–5.1)
Sodium: 144 mmol/L (ref 135–145)
Total Bilirubin: 0.3 mg/dL (ref 0.3–1.2)
Total Protein: 6.1 g/dL — ABNORMAL LOW (ref 6.5–8.1)

## 2019-08-09 LAB — CBC WITH DIFFERENTIAL (CANCER CENTER ONLY)
Abs Immature Granulocytes: 0.02 10*3/uL (ref 0.00–0.07)
Basophils Absolute: 0 10*3/uL (ref 0.0–0.1)
Basophils Relative: 1 %
Eosinophils Absolute: 0 10*3/uL (ref 0.0–0.5)
Eosinophils Relative: 0 %
HCT: 32.1 % — ABNORMAL LOW (ref 36.0–46.0)
Hemoglobin: 10.6 g/dL — ABNORMAL LOW (ref 12.0–15.0)
Immature Granulocytes: 0 %
Lymphocytes Relative: 20 %
Lymphs Abs: 1 10*3/uL (ref 0.7–4.0)
MCH: 31.9 pg (ref 26.0–34.0)
MCHC: 33 g/dL (ref 30.0–36.0)
MCV: 96.7 fL (ref 80.0–100.0)
Monocytes Absolute: 0.5 10*3/uL (ref 0.1–1.0)
Monocytes Relative: 9 %
Neutro Abs: 3.4 10*3/uL (ref 1.7–7.7)
Neutrophils Relative %: 70 %
Platelet Count: 196 10*3/uL (ref 150–400)
RBC: 3.32 MIL/uL — ABNORMAL LOW (ref 3.87–5.11)
RDW: 16.2 % — ABNORMAL HIGH (ref 11.5–15.5)
WBC Count: 4.9 10*3/uL (ref 4.0–10.5)
nRBC: 0 % (ref 0.0–0.2)

## 2019-08-09 MED ORDER — PEGFILGRASTIM 6 MG/0.6ML ~~LOC~~ PSKT
6.0000 mg | PREFILLED_SYRINGE | Freq: Once | SUBCUTANEOUS | Status: AC
  Administered 2019-08-09: 6 mg via SUBCUTANEOUS

## 2019-08-09 MED ORDER — DOXORUBICIN HCL CHEMO IV INJECTION 2 MG/ML
60.0000 mg/m2 | Freq: Once | INTRAVENOUS | Status: AC
  Administered 2019-08-09: 96 mg via INTRAVENOUS
  Filled 2019-08-09: qty 48

## 2019-08-09 MED ORDER — SODIUM CHLORIDE 0.9 % IV SOLN
10.0000 mg | Freq: Once | INTRAVENOUS | Status: AC
  Administered 2019-08-09: 10 mg via INTRAVENOUS
  Filled 2019-08-09: qty 10

## 2019-08-09 MED ORDER — SODIUM CHLORIDE 0.9 % IV SOLN
Freq: Once | INTRAVENOUS | Status: AC
  Filled 2019-08-09: qty 250

## 2019-08-09 MED ORDER — PALONOSETRON HCL INJECTION 0.25 MG/5ML
0.2500 mg | Freq: Once | INTRAVENOUS | Status: AC
  Administered 2019-08-09: 0.25 mg via INTRAVENOUS

## 2019-08-09 MED ORDER — SODIUM CHLORIDE 0.9 % IV SOLN
150.0000 mg | Freq: Once | INTRAVENOUS | Status: AC
  Administered 2019-08-09: 150 mg via INTRAVENOUS
  Filled 2019-08-09: qty 150

## 2019-08-09 MED ORDER — PALONOSETRON HCL INJECTION 0.25 MG/5ML
INTRAVENOUS | Status: AC
  Filled 2019-08-09: qty 5

## 2019-08-09 MED ORDER — SODIUM CHLORIDE 0.9% FLUSH
10.0000 mL | INTRAVENOUS | Status: DC | PRN
  Administered 2019-08-09: 10 mL
  Filled 2019-08-09: qty 10

## 2019-08-09 MED ORDER — HEPARIN SOD (PORK) LOCK FLUSH 100 UNIT/ML IV SOLN
500.0000 [IU] | Freq: Once | INTRAVENOUS | Status: AC | PRN
  Administered 2019-08-09: 500 [IU]
  Filled 2019-08-09: qty 5

## 2019-08-09 MED ORDER — SODIUM CHLORIDE 0.9% FLUSH
10.0000 mL | Freq: Once | INTRAVENOUS | Status: AC
  Administered 2019-08-09: 10 mL
  Filled 2019-08-09: qty 10

## 2019-08-09 MED ORDER — PEGFILGRASTIM 6 MG/0.6ML ~~LOC~~ PSKT
PREFILLED_SYRINGE | SUBCUTANEOUS | Status: AC
  Filled 2019-08-09: qty 0.6

## 2019-08-09 MED ORDER — SODIUM CHLORIDE 0.9 % IV SOLN
600.0000 mg/m2 | Freq: Once | INTRAVENOUS | Status: AC
  Administered 2019-08-09: 960 mg via INTRAVENOUS
  Filled 2019-08-09: qty 48

## 2019-08-09 NOTE — Progress Notes (Signed)
Pt is approved for the $1000 [employer] grant.  

## 2019-08-09 NOTE — Patient Instructions (Addendum)
[facility] Discharge Instructions for Patients Receiving Chemotherapy  Today you received the following chemotherapy agents Doxorubicin (ADRIAMYCIN) & Cyclophosphamide (CYTOXAN).  To help prevent nausea and vomiting after your treatment, we encourage you to take your nausea medication as prescribed.   If you develop nausea and vomiting that is not controlled by your nausea medication, call the clinic.   BELOW ARE SYMPTOMS THAT SHOULD BE REPORTED IMMEDIATELY:  *FEVER GREATER THAN 100.5 F  *CHILLS WITH OR WITHOUT FEVER  NAUSEA AND VOMITING THAT IS NOT CONTROLLED WITH YOUR NAUSEA MEDICATION  *UNUSUAL SHORTNESS OF BREATH  *UNUSUAL BRUISING OR BLEEDING  TENDERNESS IN MOUTH AND THROAT WITH OR WITHOUT PRESENCE OF ULCERS  *URINARY PROBLEMS  *BOWEL PROBLEMS  UNUSUAL RASH Items with * indicate a potential emergency and should be followed up as soon as possible.  Feel free to call the clinic should you have any questions or concerns. The clinic phone number is [phone].  Please show the [facility] at check-in to the Emergency Department and triage nurse.  Pegfilgrastim injection What is this medicine? PEGFILGRASTIM (PEG fil gra stim) is a long-acting granulocyte colony-stimulating factor that stimulates the growth of neutrophils, a type of white blood cell important in the body's fight against infection. It is used to reduce the incidence of fever and infection in patients with certain types of cancer who are receiving chemotherapy that affects the bone marrow, and to increase survival after being exposed to high doses of radiation. This medicine may be used for other purposes; ask your health care provider or pharmacist if you have questions. COMMON BRAND NAME(S): Steve Rattler, Ziextenzo What should I tell my health care provider before I take this medicine? They need to know if you have any of these conditions:  kidney disease  latex  allergy  ongoing radiation therapy  sickle cell disease  skin reactions to acrylic adhesives (On-Body Injector only)  an unusual or allergic reaction to pegfilgrastim, filgrastim, other medicines, foods, dyes, or preservatives  pregnant or trying to get pregnant  breast-feeding How should I use this medicine? This medicine is for injection under the skin. If you get this medicine at home, you will be taught how to prepare and give the pre-filled syringe or how to use the On-body Injector. Refer to the patient Instructions for Use for detailed instructions. Use exactly as directed. Tell your healthcare provider immediately if you suspect that the On-body Injector may not have performed as intended or if you suspect the use of the On-body Injector resulted in a missed or partial dose. It is important that you put your used needles and syringes in a special sharps container. Do not put them in a trash can. If you do not have a sharps container, call your pharmacist or healthcare provider to get one. Talk to your pediatrician regarding the use of this medicine in children. While this drug may be prescribed for selected conditions, precautions do apply. Overdosage: If you think you have taken too much of this medicine contact a poison control center or emergency room at once. NOTE: This medicine is only for you. Do not share this medicine with others. What if I miss a dose? It is important not to miss your dose. Call your doctor or health care professional if you miss your dose. If you miss a dose due to an On-body Injector failure or leakage, a new dose should be administered as soon as possible using a single prefilled syringe for manual  use. What may interact with this medicine? Interactions have not been studied. Give your health care provider a list of all the medicines, herbs, non-prescription drugs, or dietary supplements you use. Also tell them if you smoke, drink alcohol, or use illegal  drugs. Some items may interact with your medicine. This list may not describe all possible interactions. Give your health care provider a list of all the medicines, herbs, non-prescription drugs, or dietary supplements you use. Also tell them if you smoke, drink alcohol, or use illegal drugs. Some items may interact with your medicine. What should I watch for while using this medicine? You may need blood work done while you are taking this medicine. If you are going to need a MRI, CT scan, or other procedure, tell your doctor that you are using this medicine (On-Body Injector only). What side effects may I notice from receiving this medicine? Side effects that you should report to your doctor or health care professional as soon as possible:  allergic reactions like skin rash, itching or hives, swelling of the face, lips, or tongue  back pain  dizziness  fever  pain, redness, or irritation at site where injected  pinpoint red spots on the skin  red or dark-brown urine  shortness of breath or breathing problems  stomach or side pain, or pain at the shoulder  swelling  tiredness  trouble passing urine or change in the amount of urine Side effects that usually do not require medical attention (report to your doctor or health care professional if they continue or are bothersome):  bone pain  muscle pain This list may not describe all possible side effects. Call your doctor for medical advice about side effects. You may report side effects to FDA at 1-800-FDA-1088. Where should I keep my medicine? Keep out of the reach of children. If you are using this medicine at home, you will be instructed on how to store it. Throw away any unused medicine after the expiration date on the label. NOTE: This sheet is a summary. It may not cover all possible information. If you have questions about this medicine, talk to your doctor, pharmacist, or health care provider.  2020 Elsevier/Gold  Standard (2017-04-06 16:57:08)

## 2019-08-09 NOTE — Patient Instructions (Signed)

## 2019-08-10 ENCOUNTER — Telehealth: Payer: Self-pay | Admitting: Adult Health

## 2019-08-10 NOTE — Telephone Encounter (Signed)
No 7/27 los, no changes made to pt schedule

## 2019-08-11 ENCOUNTER — Other Ambulatory Visit: Payer: 59

## 2019-08-11 ENCOUNTER — Inpatient Hospital Stay: Payer: 59

## 2019-08-11 ENCOUNTER — Ambulatory Visit: Payer: 59

## 2019-08-11 ENCOUNTER — Ambulatory Visit: Payer: 59 | Admitting: Oncology

## 2019-08-13 ENCOUNTER — Ambulatory Visit: Payer: 59

## 2019-08-15 ENCOUNTER — Encounter: Payer: Self-pay | Admitting: *Deleted

## 2019-08-16 ENCOUNTER — Other Ambulatory Visit: Payer: Self-pay | Admitting: *Deleted

## 2019-08-16 ENCOUNTER — Encounter: Payer: Self-pay | Admitting: *Deleted

## 2019-08-16 ENCOUNTER — Encounter: Payer: Self-pay | Admitting: Oncology

## 2019-08-16 ENCOUNTER — Other Ambulatory Visit: Payer: 59

## 2019-08-16 ENCOUNTER — Ambulatory Visit: Payer: 59

## 2019-08-16 ENCOUNTER — Ambulatory Visit: Payer: 59 | Admitting: Oncology

## 2019-08-16 MED ORDER — ACYCLOVIR 400 MG PO TABS: ORAL_TABLET | ORAL | 3 refills | Status: DC

## 2019-08-18 ENCOUNTER — Ambulatory Visit: Payer: 59

## 2019-08-22 ENCOUNTER — Encounter: Payer: Self-pay | Admitting: *Deleted

## 2019-08-23 ENCOUNTER — Inpatient Hospital Stay (HOSPITAL_BASED_OUTPATIENT_CLINIC_OR_DEPARTMENT_OTHER): Payer: 59 | Admitting: Adult Health

## 2019-08-23 ENCOUNTER — Inpatient Hospital Stay: Payer: 59

## 2019-08-23 ENCOUNTER — Encounter: Payer: Self-pay | Admitting: Adult Health

## 2019-08-23 ENCOUNTER — Telehealth: Payer: Self-pay | Admitting: Adult Health

## 2019-08-23 ENCOUNTER — Inpatient Hospital Stay: Payer: 59 | Attending: Oncology

## 2019-08-23 ENCOUNTER — Other Ambulatory Visit: Payer: Self-pay

## 2019-08-23 VITALS — BP 140/83 | HR 96 | Temp 97.9°F | Resp 16 | Ht 62.5 in | Wt 134.7 lb

## 2019-08-23 DIAGNOSIS — Z803 Family history of malignant neoplasm of breast: Secondary | ICD-10-CM | POA: Insufficient documentation

## 2019-08-23 DIAGNOSIS — C50312 Malignant neoplasm of lower-inner quadrant of left female breast: Secondary | ICD-10-CM

## 2019-08-23 DIAGNOSIS — Z170 Estrogen receptor positive status [ER+]: Secondary | ICD-10-CM | POA: Insufficient documentation

## 2019-08-23 DIAGNOSIS — Z79899 Other long term (current) drug therapy: Secondary | ICD-10-CM | POA: Diagnosis not present

## 2019-08-23 DIAGNOSIS — Z811 Family history of alcohol abuse and dependence: Secondary | ICD-10-CM | POA: Diagnosis not present

## 2019-08-23 DIAGNOSIS — Z8249 Family history of ischemic heart disease and other diseases of the circulatory system: Secondary | ICD-10-CM | POA: Diagnosis not present

## 2019-08-23 DIAGNOSIS — Z5111 Encounter for antineoplastic chemotherapy: Secondary | ICD-10-CM | POA: Diagnosis not present

## 2019-08-23 LAB — CBC WITH DIFFERENTIAL (CANCER CENTER ONLY)
Abs Immature Granulocytes: 0.41 10*3/uL — ABNORMAL HIGH (ref 0.00–0.07)
Basophils Absolute: 0.1 10*3/uL (ref 0.0–0.1)
Basophils Relative: 1 %
Eosinophils Absolute: 0 10*3/uL (ref 0.0–0.5)
Eosinophils Relative: 0 %
HCT: 32.8 % — ABNORMAL LOW (ref 36.0–46.0)
Hemoglobin: 10.8 g/dL — ABNORMAL LOW (ref 12.0–15.0)
Immature Granulocytes: 4 %
Lymphocytes Relative: 12 %
Lymphs Abs: 1.2 10*3/uL (ref 0.7–4.0)
MCH: 32.1 pg (ref 26.0–34.0)
MCHC: 32.9 g/dL (ref 30.0–36.0)
MCV: 97.6 fL (ref 80.0–100.0)
Monocytes Absolute: 0.6 10*3/uL (ref 0.1–1.0)
Monocytes Relative: 6 %
Neutro Abs: 8.4 10*3/uL — ABNORMAL HIGH (ref 1.7–7.7)
Neutrophils Relative %: 77 %
Platelet Count: 91 10*3/uL — ABNORMAL LOW (ref 150–400)
RBC: 3.36 MIL/uL — ABNORMAL LOW (ref 3.87–5.11)
RDW: 16.8 % — ABNORMAL HIGH (ref 11.5–15.5)
WBC Count: 10.7 10*3/uL — ABNORMAL HIGH (ref 4.0–10.5)
nRBC: 0 % (ref 0.0–0.2)

## 2019-08-23 LAB — CMP (CANCER CENTER ONLY)
ALT: 41 U/L (ref 0–44)
AST: 25 U/L (ref 15–41)
Albumin: 3.9 g/dL (ref 3.5–5.0)
Alkaline Phosphatase: 109 U/L (ref 38–126)
Anion gap: 7 (ref 5–15)
BUN: 7 mg/dL (ref 6–20)
CO2: 25 mmol/L (ref 22–32)
Calcium: 9.6 mg/dL (ref 8.9–10.3)
Chloride: 107 mmol/L (ref 98–111)
Creatinine: 0.71 mg/dL (ref 0.44–1.00)
GFR, Est AFR Am: >60 mL/min (ref >60)
GFR, Estimated: >60 mL/min (ref >60)
Glucose, Bld: 129 mg/dL — ABNORMAL HIGH (ref 70–99)
Potassium: 3.9 mmol/L (ref 3.5–5.1)
Sodium: 139 mmol/L (ref 135–145)
Total Bilirubin: 0.3 mg/dL (ref 0.3–1.2)
Total Protein: 6.5 g/dL (ref 6.5–8.1)

## 2019-08-23 MED ORDER — PALONOSETRON HCL INJECTION 0.25 MG/5ML
0.2500 mg | Freq: Once | INTRAVENOUS | Status: AC
  Administered 2019-08-23: 0.25 mg via INTRAVENOUS

## 2019-08-23 MED ORDER — SODIUM CHLORIDE 0.9 % IV SOLN
600.0000 mg/m2 | Freq: Once | INTRAVENOUS | Status: AC
  Administered 2019-08-23: 960 mg via INTRAVENOUS
  Filled 2019-08-23: qty 48

## 2019-08-23 MED ORDER — PEGFILGRASTIM 6 MG/0.6ML ~~LOC~~ PSKT
6.0000 mg | PREFILLED_SYRINGE | Freq: Once | SUBCUTANEOUS | Status: AC
  Administered 2019-08-23: 6 mg via SUBCUTANEOUS

## 2019-08-23 MED ORDER — SODIUM CHLORIDE 0.9% FLUSH
10.0000 mL | INTRAVENOUS | Status: DC | PRN
  Administered 2019-08-23: 10 mL
  Filled 2019-08-23: qty 10

## 2019-08-23 MED ORDER — DOXORUBICIN HCL CHEMO IV INJECTION 2 MG/ML
60.0000 mg/m2 | Freq: Once | INTRAVENOUS | Status: AC
  Administered 2019-08-23: 96 mg via INTRAVENOUS
  Filled 2019-08-23: qty 48

## 2019-08-23 MED ORDER — SODIUM CHLORIDE 0.9 % IV SOLN
Freq: Once | INTRAVENOUS | Status: AC
  Filled 2019-08-23: qty 250

## 2019-08-23 MED ORDER — PEGFILGRASTIM 6 MG/0.6ML ~~LOC~~ PSKT
PREFILLED_SYRINGE | SUBCUTANEOUS | Status: AC
  Filled 2019-08-23: qty 0.6

## 2019-08-23 MED ORDER — HEPARIN SOD (PORK) LOCK FLUSH 100 UNIT/ML IV SOLN
500.0000 [IU] | Freq: Once | INTRAVENOUS | Status: AC | PRN
  Administered 2019-08-23: 500 [IU]
  Filled 2019-08-23: qty 5

## 2019-08-23 MED ORDER — VALACYCLOVIR HCL 500 MG PO TABS: 500.0000 mg | ORAL_TABLET | Freq: Two times a day (BID) | ORAL | 1 refills | Status: DC

## 2019-08-23 MED ORDER — SODIUM CHLORIDE 0.9 % IV SOLN
150.0000 mg | Freq: Once | INTRAVENOUS | Status: AC
  Administered 2019-08-23: 150 mg via INTRAVENOUS
  Filled 2019-08-23: qty 150

## 2019-08-23 MED ORDER — PALONOSETRON HCL INJECTION 0.25 MG/5ML
INTRAVENOUS | Status: AC
  Filled 2019-08-23: qty 5

## 2019-08-23 MED ORDER — SODIUM CHLORIDE 0.9 % IV SOLN
10.0000 mg | Freq: Once | INTRAVENOUS | Status: AC
  Administered 2019-08-23: 10 mg via INTRAVENOUS
  Filled 2019-08-23: qty 10

## 2019-08-23 NOTE — Telephone Encounter (Signed)
Scheduled appts per 8/10 los. Left voicemail with appt date and time.  

## 2019-08-23 NOTE — Progress Notes (Signed)
[facility]  [phone] [phone]) [phone]     ID: Kathy Barker DOB: 03-Feb-1960  MR#: [id number]  QAS#:[id number]  Patient Care Team: Lupita Raider, MD as PCP - General (Family Medicine) Donnelly Angelica, RN as Oncology Nurse Navigator Pershing Proud, RN as Oncology Nurse Navigator Griselda Miner, MD as Consulting Physician (General Surgery) [name], Valentino Hue, MD as Consulting Physician (Oncology) Lonie Peak, MD as Attending Physician (Radiation Oncology) Janalyn Harder, MD as Consulting Physician (Dermatology) Noreene Filbert, NP OTHER MD:  CHIEF COMPLAINT: Left-sided breast cancer  CURRENT TREATMENT: Neoadjuvant chemotherapy   INTERVAL HISTORY: Kathy Barker returns today for follow up and treatment of her left-sided breast cancer.   She was started on neoadjvuant chematoherapy with doxorubicin and cyclophosphamide given on day 1 of a 14 day schedule, with Udenyca support on day 3. Today is day 1 cycle 4.  Her treatment   REVIEW OF SYSTEMS: Kathy Barker is doing well today.  She notes that she had several issues this past week.  She has had mouth sores, taste changes, fatigue.  She then had diarrhea this past Saturday.  She says that it has since improved.  She has not had any fevers.  She is without abdominal discomfort, nausea, vomiting, vision issues, or any other concerns.  A detailed ROS was otherwise non contributory.     HISTORY OF CURRENT ILLNESS: From the original intake note:  Kathy Barker herself noted a palpable lower-inner left breast lump and immediately brought it to medical attention.. She underwent bilateral diagnostic mammography with tomography and left breast ultrasonography at [facility] on 05/30/2019 showing: breast density category B; 1.7 cm lobulated mass in left breast at 8 o'clock; no significant left axillary abnormalities.  Accordingly on 06/01/2019 she proceeded to biopsy of the left breast area in question. The pathology from this  procedure ([id number]) showed: invasive ductal carcinoma, grade 3. Prognostic indicators significant for: estrogen receptor, 70% positive with weak staining intensity and progesterone receptor, 0% negative. Proliferation marker Ki67 at 80%. HER2 negative by immunohistochemistry (1+).  The patient's subsequent history is as detailed below.   PAST MEDICAL HISTORY: Past Medical History:  Diagnosis Date  . Allergic rhinitis   . Anxiety    "only during my divorce" (11/18/2012)  . Cancer (HCC)   . Depression    "only during my divorce" (11/18/2012)  . GERD (gastroesophageal reflux disease)   . High cholesterol   . Hot flashes   . IBS (irritable bowel syndrome)   . Insomnia   . Migraines    "lately more often; before SVT I'd get them ~ q 6 months" (11/18/2012)  . PONV (postoperative nausea and vomiting)   . 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 CESAREAN SECTION  1992  . COMBINED HYSTERECTOMY VAGINAL / OOPHORECTOMY / A&P REPAIR  1999  . IR IMAGING GUIDED PORT INSERTION  06/30/2019  . SUPRAVENTRICULAR TACHYCARDIA ABLATION  11-18-2012   slow pathway modification of AVNRT by Dr Johney Frame  . SUPRAVENTRICULAR TACHYCARDIA ABLATION N/A 11/18/2012   Procedure: SUPRAVENTRICULAR TACHYCARDIA ABLATION;  Surgeon: Gardiner Rhyme, MD;  Location: [facility] CATH LAB;  Service: Cardiovascular;  Laterality: N/A;  . VAGINAL HYSTERECTOMY  ~ 2000    FAMILY HISTORY: Family History  Problem Relation Age of Onset  . Heart attack Father   . Alcohol abuse Father   . Heart attack  Paternal Grandfather   . Heart Problems Other        both sides of family  . Breast cancer Mother    Her father died at age 22 from alcohol abuse. Her mother is living at age 50 (as of 05/2019) and has a history of DCIS at age 19 and invasive lobular breast cancer at age 75. Kathy Barker has one brother. She  reports cancer of an unknown type in her maternal grandmother and prostate cancer in a maternal uncle.   GYNECOLOGIC HISTORY:  No LMP recorded. Patient has had a hysterectomy. Menarche: 59 years old Age at first live birth: 59 years old GX P 2 LMP 2000 Contraceptive: never used HRT never used  Hysterectomy? yes BSO? 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  She will be on temporary disability during her chemotherapy.  She is divorced. She lives at home with son Victory Dakin, age 46, who works in Aeronautical engineer in [place]. Son Timothy Lasso, age 20, works in Engineer, petroleum here in [place]. She is not a Actor.     ADVANCED DIRECTIVES: Not in place. She intends to name both of her sons as her HCPOA.   HEALTH MAINTENANCE: Social History   Tobacco Use  . Smoking status: Never Smoker  . Smokeless tobacco: Never Used  Vaping Use  . Vaping Use: Never used  Substance Use Topics  . Alcohol use: Yes    Comment: 11/18/2012 "shot of brandy couple times/month"  . Drug use: No     Colonoscopy: yes, date unsure  PAP: date unsure  Bone density: never done   Allergies  Allergen Reactions  . Fentanyl Nausea And Vomiting    Per patient " extreme nausea and vomiting "   . Other     Problems with stitches  . Versed [Midazolam] Nausea And Vomiting    Per patient " extreme N/V"   . Latex Rash    Current Outpatient Medications  Medication Sig Dispense Refill  . acyclovir (ZOVIRAX) 400 MG tablet Take 2 tablets tid for 10 days then decrease to 1 tablet twice a day 120 tablet 3  . cetirizine (ZYRTEC) 10 MG tablet Take 10 mg by mouth daily.    [name] dexamethasone (DECADRON) 4 MG tablet Take 2 tablets by mouth twice a day starting on the day after chemotherapy (day 2), repeat the next day (day 3) then take 2 tablets in AM only day 4, then stop.  Take with food. 30 tablet 1  . lidocaine-prilocaine (EMLA)  cream Apply to affected area once 30 g 3  . LIVALO 4 MG TABS Take 1 tablet by mouth daily.    [name] loratadine (CLARITIN) 10 MG tablet Take 1 tablet (10 mg total) by mouth daily. 60 tablet 3  . LORazepam (ATIVAN) 0.5 MG tablet Use 1 tab daily as needed for anxiety and or nausea. 30 tablet 0  . omeprazole (PRILOSEC) 40 MG capsule Take one capsule daily for 5 days starting on chemo day, then daily as needed 60 capsule 3  . prochlorperazine (COMPAZINE) 10 MG tablet Take 1 tablet (10 mg total) by mouth every 6 (six) hours as needed (Nausea or vomiting). 30 tablet 1  . sertraline (ZOLOFT) 100 MG tablet Take 100 mg by mouth daily.    . SYMBICORT 160-4.5 MCG/ACT inhaler Inhale 2 puffs into the lungs 2 (two) times daily.    . temazepam (RESTORIL) 15 MG capsule Take 15 mg  by mouth at bedtime.     No current facility-administered medications for this visit.    OBJECTIVE: White woman in no acute distress Patient seen in infusion, review vitals in [facility] Vitals:   08/23/19 1007  BP: 140/83  Pulse: 96  Resp: 16  Temp: 97.9 F (36.6 C)  SpO2: 99%     Body mass index is 24.24 kg/m.   Wt Readings from Last 3 Encounters:  08/23/19 134 lb 11.2 oz (61.1 kg)  08/09/19 136 lb (61.7 kg)  08/02/19 132 lb 8 oz (60.1 kg)      ECOG FS:1 - Symptomatic but completely ambulatory  GENERAL: Patient is a well appearing female in no acute distress HEENT:  Sclerae anicteric. Mask in place. Neck is supple.  NODES:  No cervical, supraclavicular, or axillary lymphadenopathy palpated.  BREAST EXAM:  Deferred. LUNGS:  Clear to auscultation bilaterally.  No wheezes or rhonchi. HEART:  Regular rate and rhythm. No murmur appreciated. ABDOMEN:  Soft, nontender.  Positive, normoactive bowel sounds. No organomegaly palpated. MSK:  No focal spinal tenderness to palpation. Full range of motion bilaterally in the upper extremities. EXTREMITIES:  No peripheral edema.   SKIN:  Clear with no obvious rashes or skin changes. No nail  dyscrasia. NEURO:  Nonfocal. Well oriented.  Appropriate affect.     LAB RESULTS:  CMP     Component Value Date/Time   NA 139 08/23/2019 0945   K 3.9 08/23/2019 0945   CL 107 08/23/2019 0945   CO2 25 08/23/2019 0945   GLUCOSE 129 (H) 08/23/2019 0945   BUN 7 08/23/2019 0945   CREATININE 0.71 08/23/2019 0945   CALCIUM 9.6 08/23/2019 0945   PROT 6.5 08/23/2019 0945   ALBUMIN 3.9 08/23/2019 0945   AST 25 08/23/2019 0945   ALT 41 08/23/2019 0945   ALKPHOS 109 08/23/2019 0945   BILITOT 0.3 08/23/2019 0945   GFRNONAA >60 08/23/2019 0945   GFRAA >60 08/23/2019 0945    No results found for: TOTALPROTELP, ALBUMINELP, A1GS, A2GS, BETS, BETA2SER, GAMS, MSPIKE, SPEI  Lab Results  Component Value Date   WBC 10.7 (H) 08/23/2019   NEUTROABS 8.4 (H) 08/23/2019   HGB 10.8 (L) 08/23/2019   HCT 32.8 (L) 08/23/2019   MCV 97.6 08/23/2019   PLT 91 (L) 08/23/2019    No results found for: LABCA2  No components found for: SAYTKZ601  No results for input(s): INR in the last 168 hours.  No results found for: LABCA2  No results found for: UXN235  No results found for: TDD220  No results found for: URK270  No results found for: CA2729  No components found for: HGQUANT  No results found for: CEA1 / No results found for: CEA1   No results found for: AFPTUMOR  No results found for: CHROMOGRNA  No results found for: KPAFRELGTCHN, LAMBDASER, KAPLAMBRATIO (kappa/lambda light chains)  No results found for: HGBA, HGBA2QUANT, HGBFQUANT, HGBSQUAN (Hemoglobinopathy evaluation)   No results found for: LDH  No results found for: IRON, TIBC, IRONPCTSAT (Iron and TIBC)  No results found for: FERRITIN  Urinalysis No results found for: COLORURINE, APPEARANCEUR, LABSPEC, PHURINE, GLUCOSEU, HGBUR, BILIRUBINUR, KETONESUR, PROTEINUR, UROBILINOGEN, NITRITE, LEUKOCYTESUR   STUDIES: No results found.   ELIGIBLE FOR AVAILABLE RESEARCH PROTOCOL: no  ASSESSMENT: 59 y.o. [place]  woman status post left breast lower inner quadrant biopsy 06/01/2019 for a clinical T1c N0, stage IB invasive ductal carcinoma, grade 3, weakly estrogen receptor positive but functionally triple negative, with an MIB-1 of 80%.  (1)  Oncotype score of 51 predicts a risk of recurrence outside the breast in the next 9 years of greater than 39% if the patient's only systemic therapy is antiestrogens for 5 years.  It also predicts a greater than 15% benefit from chemotherapy  (2) neoadjuvant chemotherapy will consist of doxorubicin and cyclophosphamide in dose dense fashion x4 beginning 06/30/2019, to be followed by weekly paclitaxel and carboplatin weekly x12  (3) definitive surgery pending  (4) adjuvant radiation to follow: Consider capecitabine sensitization   PLAN: Kathy Barker continues to do well with her treatment.  Her platelet count is 91.  I reviewed this with Dr. Jana Hakim and she will proceed with her final doxorubicin and cyclophosphamide today.  Clinically, she no longer feels her breast tumor, which is evidence of response.    She will continue mouth rinses for her mucositis.  I did not see evidence of this today.  I sent in Valtrex for her to take BID to help with this.  Khristian will let us know if her diarrhea recurs.  If it does, we will likely need Cdiff testing.    We will see Kathy Barker back in 1 weeks for labs, f/u.  She knows to call for any questions that may arise between now and her next appointment.  We are happy to see her sooner if needed.   Total encounter time 20 minutes.Wilber Bihari, NP 08/23/19 10:37 AM Medical Oncology and Hematology [facility] [place], [place] [address] Tel. [phone]    Fax. [phone]    *Total Encounter Time as defined by the Centers for Medicare and Medicaid Services includes, in addition to the face-to-face time of a patient visit (documented in the note above) non-face-to-face time: obtaining and reviewing outside  history, ordering and reviewing medications, tests or procedures, care coordination (communications with other health care professionals or caregivers) and documentation in the medical record.

## 2019-08-25 ENCOUNTER — Ambulatory Visit: Payer: 59

## 2019-08-26 ENCOUNTER — Encounter: Payer: Self-pay | Admitting: Oncology

## 2019-08-26 ENCOUNTER — Telehealth: Payer: Self-pay | Admitting: *Deleted

## 2019-08-26 NOTE — Telephone Encounter (Signed)
Pt sent aJust wanted to check on the new prescription. My insurance is questioning it because they say the medicine is just like the Zovirax.

## 2019-08-29 ENCOUNTER — Encounter: Payer: Self-pay | Admitting: *Deleted

## 2019-08-29 ENCOUNTER — Encounter: Payer: Self-pay | Admitting: Oncology

## 2019-08-29 NOTE — Progress Notes (Signed)
Pt would like to apply for copay assistance for Udenyca so I enrolled her in the [place] Completeprogramfor [employer] $15,000 for 12 monthsfrom 08/29/19.  Ptis eligible to have $0 out-of-pocket costs for each Udenyca dose.

## 2019-08-30 NOTE — Progress Notes (Signed)
Kathy Barker  [phone] [phone]     ID: Kathy Barker DOB: 1960-02-03  MR#: [id number]  SNK#:[id number]  Patient Care Team: Kathy Neer, MD as PCP - General (Family Medicine) Kathy Germany, RN as Oncology Nurse Navigator Kathy Kaufmann, RN as Oncology Nurse Navigator Kathy Kussmaul, MD as Consulting Physician (General Surgery) [name], [name], MD as Consulting Physician (Oncology) [name], MD as Attending Physician (Radiation Oncology) Lavonna Monarch, MD as Consulting Physician (Dermatology) Scot Dock, NP OTHER MD:  CHIEF COMPLAINT: Left-sided breast cancer  CURRENT TREATMENT: Neoadjuvant chemotherapy   INTERVAL HISTORY: Kathy Barker today for follow up and treatment of her left-sided breast cancer.   She was started on neoadjvuant chematoherapy with doxorubicin and cyclophosphamide given on day 1 of a 14 day schedule, with Udenyca support on day 3. Today is day 8 cycle 4.  Her treatment   REVIEW OF SYSTEMS: Kathy Barker has had significant issues with her mouth that began a few days ago.  She notes that her lips feel like they are doused in [place].  She notes her throat feels tighter too, and she has been cutting her food up smaller.  She was taking Acyclovir for genital herpes, and then Valtrex for her mucositis, however, she couldn't get them from her pharmacy.  She is eating and drinking without difficulty.    She denies any fever or chills.  She has no cough, shortness of breath, bowel/bladder changes, headaches, nausea or vomiting, or any other concerns today.  A detailed ROS was otherwise non contributory.     HISTORY OF CURRENT ILLNESS: From the original intake note:  Kathy Barker herself noted a palpable lower-inner left breast lump and immediately brought it to medical attention.. She underwent bilateral diagnostic mammography with tomography and left breast ultrasonography at [facility] on 05/30/2019 showing: breast  density category B; 1.7 cm lobulated mass in left breast at 8 o'clock; no significant left axillary abnormalities.  Accordingly on 06/01/2019 she proceeded to biopsy of the left breast area in question. The pathology from this procedure ([id number]) showed: invasive ductal carcinoma, grade 3. Prognostic indicators significant for: estrogen receptor, 70% positive with weak staining intensity and progesterone receptor, 0% negative. Proliferation marker Ki67 at 80%. HER2 negative by immunohistochemistry (1+).  The patient's subsequent history is as detailed below.   PAST MEDICAL HISTORY: Past Medical History:  Diagnosis Date  . Allergic rhinitis   . Anxiety    "only during my divorce" (11/18/2012)  . Cancer ([place])   . Depression    "only during my divorce" (11/18/2012)  . GERD (gastroesophageal reflux disease)   . High cholesterol   . Hot flashes   . IBS (irritable bowel syndrome)   . Insomnia   . Migraines    "lately more often; before SVT I'd get them ~ q 6 months" (11/18/2012)  . PONV (postoperative nausea and vomiting)   . 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 CESAREAN SECTION  1992  . COMBINED HYSTERECTOMY VAGINAL / OOPHORECTOMY / A&Barker REPAIR  1999  . IR IMAGING GUIDED PORT INSERTION  06/30/2019  . 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 SUPRAVENTRICULAR TACHYCARDIA ABLATION N/A 11/18/2012   Procedure: SUPRAVENTRICULAR TACHYCARDIA ABLATION;  Surgeon: Coralyn Mark, MD;  Location: [facility];  Service:  Cardiovascular;  Laterality: N/A;  . VAGINAL HYSTERECTOMY  ~ 2000    FAMILY HISTORY: Family History  Problem Relation Age of Onset  . Heart attack Father   . Alcohol abuse Father   . Heart attack Paternal Grandfather   . Heart Problems Other        both sides of family  .  Breast cancer Mother    Her father died at age 54 from alcohol abuse. Her mother is living at age 48 (as of 05/2019) and has a history of DCIS at age 59 and invasive lobular breast cancer at age 54. Kathy Barker has one brother. She reports cancer of an unknown type in her maternal grandmother and prostate cancer in a maternal uncle.   GYNECOLOGIC HISTORY:  No LMP recorded. Patient has had a hysterectomy. Menarche: 59 years old Age at first live birth: 59 years old Kathy Barker 2 LMP 2000 Contraceptive: never used HRT never used  Hysterectomy? yes BSO? 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  She will be on temporary disability during her chemotherapy.  She is divorced. She lives at home with son Kathy Barker, age 23, who works in Biomedical scientist in [place]. Son Kathy Barker, age 21, works in Sales executive here in [place]. 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     ADVANCED DIRECTIVES: Not in place. She intends to name both of her sons as her 23.   HEALTH MAINTENANCE: Social History   Tobacco Use  . Smoking status: Never Smoker  . Smokeless tobacco: Never Used  Vaping Use  . Vaping Use: Never used  Substance Use Topics  . Alcohol use: Yes    Comment: 11/18/2012 "shot of brandy couple times/month"  . Drug use: No     Colonoscopy: yes, date unsure  PAP: date unsure  Bone density: never done   Allergies  Allergen Reactions  . Fentanyl Nausea And Vomiting    Per patient " extreme nausea and vomiting "   . Other     Problems with stitches  . Versed [Midazolam] Nausea And Vomiting    Per patient " extreme N/V"   . Latex Rash    Current Outpatient Medications  Medication Sig Dispense Refill  . acyclovir (ZOVIRAX) 400 MG tablet Take 2 tablets tid for 10 days then decrease to 1 tablet twice a day 120 tablet 3  . dexamethasone (DECADRON) 4 MG tablet Take 2 tablets by mouth twice a day starting on the day after  chemotherapy (day 2), repeat the next day (day 3) then take 2 tablets in AM only day 4, then stop.  Take with food. 30 tablet 1  . lidocaine-prilocaine (EMLA) cream Apply to affected area once 30 g 3  . LIVALO 4 MG TABS Take 1 tablet by mouth daily.    [name] loratadine (CLARITIN) 10 MG tablet Take 1 tablet (10 mg total) by mouth daily. 60 tablet 3  . LORazepam (ATIVAN) 0.5 MG tablet Use 1 tab daily as needed for anxiety and or nausea. 30 tablet 0  . omeprazole (PRILOSEC) 40 MG capsule Take one capsule daily for 5 days starting on chemo day, then daily as needed 60 capsule 3  . prochlorperazine (COMPAZINE) 10 MG tablet Take 1 tablet (10 mg total) by mouth every 6 (six) hours as needed (Nausea or vomiting). 30 tablet 1  . sertraline (ZOLOFT) 100 MG tablet Take 100 mg by mouth daily.    Dellis Anes  160-4.5 MCG/ACT inhaler Inhale 2 puffs into the lungs 2 (two) times daily.    . temazepam (RESTORIL) 15 MG capsule Take 15 mg by mouth at bedtime.     No current facility-administered medications for this visit.    OBJECTIVE: Vitals:   08/31/19 1150  BP: 134/67  Pulse: (!) 109  Resp: 17  Temp: 98.6 F (37 C)  SpO2: 100%     Body mass index is 24.41 kg/m.   Wt Readings from Last 3 Encounters:  08/31/19 135 lb 9.6 oz (61.5 kg)  08/23/19 134 lb 11.2 oz (61.1 kg)  08/09/19 136 lb (61.7 kg)      ECOG FS:1 - Symptomatic but completely ambulatory  GENERAL: Patient is a well appearing female in no acute distress HEENT:  Sclerae anicteric. Lips are swollen, no oral candida noted, no ulcerations visualized. Neck is supple.  NODES:  No cervical, supraclavicular, or axillary lymphadenopathy palpated.  BREAST EXAM:  Unable to palpate left breast mass LUNGS:  Clear to auscultation bilaterally.  No wheezes or rhonchi. HEART:  Regular rate and rhythm. No murmur appreciated. ABDOMEN:  Soft, nontender.  Positive, normoactive bowel sounds. No organomegaly palpated. MSK:  No focal spinal tenderness to  palpation. Full range of motion bilaterally in the upper extremities. EXTREMITIES:  No peripheral edema.   SKIN:  Clear with no obvious rashes or skin changes. No nail dyscrasia. NEURO:  Nonfocal. Well oriented.  Appropriate affect.     LAB RESULTS:  CMP     Component Value Date/Time   NA 139 08/31/2019 1108   K 3.3 (L) 08/31/2019 1108   CL 106 08/31/2019 1108   CO2 24 08/31/2019 1108   GLUCOSE 156 (H) 08/31/2019 1108   BUN <4 (L) 08/31/2019 1108   CREATININE 0.69 08/31/2019 1108   CALCIUM 9.5 08/31/2019 1108   PROT 6.1 (L) 08/31/2019 1108   ALBUMIN 3.7 08/31/2019 1108   AST 7 (L) 08/31/2019 1108   ALT 16 08/31/2019 1108   ALKPHOS 96 08/31/2019 1108   BILITOT 0.3 08/31/2019 1108   GFRNONAA >60 08/31/2019 1108   GFRAA >60 08/31/2019 1108    No results found for: TOTALPROTELP, ALBUMINELP, A1GS, A2GS, BETS, BETA2SER, GAMS, MSPIKE, SPEI  Lab Results  Component Value Date   WBC 1.4 (L) 08/31/2019   NEUTROABS 0.4 (LL) 08/31/2019   HGB 9.0 (L) 08/31/2019   HCT 27.4 (L) 08/31/2019   MCV 97.9 08/31/2019   PLT 105 (L) 08/31/2019    No results found for: LABCA2  No components found for: ZOXWRU045  No results for input(s): INR in the last 168 hours.  No results found for: LABCA2  No results found for: WUJ811  No results found for: BJY782  No results found for: NFA213  No results found for: CA2729  No components found for: HGQUANT  No results found for: CEA1 / No results found for: CEA1   No results found for: AFPTUMOR  No results found for: CHROMOGRNA  No results found for: KPAFRELGTCHN, LAMBDASER, KAPLAMBRATIO (kappa/lambda light chains)  No results found for: HGBA, HGBA2QUANT, HGBFQUANT, HGBSQUAN (Hemoglobinopathy evaluation)   No results found for: LDH  No results found for: IRON, TIBC, IRONPCTSAT (Iron and TIBC)  No results found for: FERRITIN  Urinalysis No results found for: COLORURINE, APPEARANCEUR, LABSPEC, PHURINE, GLUCOSEU, HGBUR,  BILIRUBINUR, KETONESUR, PROTEINUR, UROBILINOGEN, NITRITE, LEUKOCYTESUR   STUDIES: No results found.   ELIGIBLE FOR AVAILABLE RESEARCH PROTOCOL: no  ASSESSMENT: 59 y.o. [place] woman status post left breast lower inner quadrant biopsy  06/01/2019 for a clinical T1c N0, stage IB invasive ductal carcinoma, grade 3, weakly estrogen receptor positive but functionally triple negative, with an MIB-1 of 80%.  (1) Oncotype score of 51 predicts a risk of recurrence outside the breast in the next 9 years of greater than 39% if the patient's only systemic therapy is antiestrogens for 5 years.  It also predicts a greater than 15% benefit from chemotherapy  (2) neoadjuvant chemotherapy will consist of doxorubicin and cyclophosphamide in dose dense fashion x4 beginning 06/30/2019, to be followed by weekly paclitaxel and carboplatin weekly x12  (3) definitive surgery pending  (4) adjuvant radiation to follow: Consider capecitabine sensitization   PLAN: Anam is here today for labs and f/u after receiving her fourth cycle of doxorubicin and cyclophosphamide last week.  She is having a very difficult time with mouth ulcers.  I have sent in Magic mouthwash and have recommend she increase her acyclovir to TID.    Samreet is also neutropenic and we discussed neutropenic precautions in detail.  She will restart her cipro and take this BID.  Blessings and I discussed whether or not we should postpone her weekly Paclitaxel and Carbopatlin by one week or not.  We decided to keep it as is, and she knows if her labs are not within parameters, we will hold it.    She will return in one week for labs, f/u, and her next treatment.  She knows to call for any questions that may arise between now and her next appointment.  We are happy to see her sooner if needed.  Total encounter time 20 minutes.Wilber Bihari, NP 08/31/19 12:58 PM Medical Oncology and Hematology [facility] [place],  [place] [address] Tel. [phone]    Fax. [phone]    *Total Encounter Time as defined by the Centers for Medicare and Medicaid Services includes, in addition to the face-to-face time of a patient visit (documented in the note above) non-face-to-face time: obtaining and reviewing outside history, ordering and reviewing medications, tests or procedures, care coordination (communications with other health care professionals or caregivers) and documentation in the medical record.

## 2019-08-31 ENCOUNTER — Inpatient Hospital Stay: Payer: 59

## 2019-08-31 ENCOUNTER — Encounter: Payer: Self-pay | Admitting: Adult Health

## 2019-08-31 ENCOUNTER — Other Ambulatory Visit: Payer: Self-pay

## 2019-08-31 ENCOUNTER — Telehealth: Payer: Self-pay | Admitting: Adult Health

## 2019-08-31 ENCOUNTER — Inpatient Hospital Stay (HOSPITAL_BASED_OUTPATIENT_CLINIC_OR_DEPARTMENT_OTHER): Payer: 59 | Admitting: Adult Health

## 2019-08-31 VITALS — BP 134/67 | HR 109 | Temp 98.6°F | Resp 17 | Ht 62.5 in | Wt 135.6 lb

## 2019-08-31 DIAGNOSIS — Z170 Estrogen receptor positive status [ER+]: Secondary | ICD-10-CM

## 2019-08-31 DIAGNOSIS — C50312 Malignant neoplasm of lower-inner quadrant of left female breast: Secondary | ICD-10-CM

## 2019-08-31 DIAGNOSIS — Z95828 Presence of other vascular implants and grafts: Secondary | ICD-10-CM

## 2019-08-31 DIAGNOSIS — Z5111 Encounter for antineoplastic chemotherapy: Secondary | ICD-10-CM | POA: Diagnosis not present

## 2019-08-31 LAB — CBC WITH DIFFERENTIAL (CANCER CENTER ONLY)
Abs Immature Granulocytes: 0.03 10*3/uL (ref 0.00–0.07)
Basophils Absolute: 0 10*3/uL (ref 0.0–0.1)
Basophils Relative: 2 %
Eosinophils Absolute: 0 10*3/uL (ref 0.0–0.5)
Eosinophils Relative: 0 %
HCT: 27.4 % — ABNORMAL LOW (ref 36.0–46.0)
Hemoglobin: 9 g/dL — ABNORMAL LOW (ref 12.0–15.0)
Immature Granulocytes: 2 %
Lymphocytes Relative: 42 %
Lymphs Abs: 0.5 10*3/uL — ABNORMAL LOW (ref 0.7–4.0)
MCH: 32.1 pg (ref 26.0–34.0)
MCHC: 32.8 g/dL (ref 30.0–36.0)
MCV: 97.9 fL (ref 80.0–100.0)
Monocytes Absolute: 0.3 10*3/uL (ref 0.1–1.0)
Monocytes Relative: 25 %
Neutro Abs: 0.4 10*3/uL — CL (ref 1.7–7.7)
Neutrophils Relative %: 29 %
Platelet Count: 105 10*3/uL — ABNORMAL LOW (ref 150–400)
RBC: 2.8 MIL/uL — ABNORMAL LOW (ref 3.87–5.11)
RDW: 15.9 % — ABNORMAL HIGH (ref 11.5–15.5)
WBC Count: 1.4 10*3/uL — ABNORMAL LOW (ref 4.0–10.5)
nRBC: 1.5 % — ABNORMAL HIGH (ref 0.0–0.2)

## 2019-08-31 LAB — CMP (CANCER CENTER ONLY)
ALT: 16 U/L (ref 0–44)
AST: 7 U/L — ABNORMAL LOW (ref 15–41)
Albumin: 3.7 g/dL (ref 3.5–5.0)
Alkaline Phosphatase: 96 U/L (ref 38–126)
Anion gap: 9 (ref 5–15)
BUN: <4 mg/dL — ABNORMAL LOW (ref 6–20)
CO2: 24 mmol/L (ref 22–32)
Calcium: 9.5 mg/dL (ref 8.9–10.3)
Chloride: 106 mmol/L (ref 98–111)
Creatinine: 0.69 mg/dL (ref 0.44–1.00)
GFR, Est AFR Am: >60 mL/min (ref >60)
GFR, Estimated: >60 mL/min (ref >60)
Glucose, Bld: 156 mg/dL — ABNORMAL HIGH (ref 70–99)
Potassium: 3.3 mmol/L — ABNORMAL LOW (ref 3.5–5.1)
Sodium: 139 mmol/L (ref 135–145)
Total Bilirubin: 0.3 mg/dL (ref 0.3–1.2)
Total Protein: 6.1 g/dL — ABNORMAL LOW (ref 6.5–8.1)

## 2019-08-31 MED ORDER — CIPROFLOXACIN HCL 500 MG PO TABS
500.0000 mg | ORAL_TABLET | Freq: Two times a day (BID) | ORAL | 0 refills | Status: DC
Start: 2019-08-31 — End: 2019-12-05

## 2019-08-31 MED ORDER — MAGIC MOUTHWASH W/LIDOCAINE
5.0000 mL | Freq: Four times a day (QID) | ORAL | 1 refills | Status: DC | PRN
Start: 2019-08-31 — End: 2019-12-05

## 2019-08-31 MED ORDER — HEPARIN SOD (PORK) LOCK FLUSH 100 UNIT/ML IV SOLN
500.0000 [IU] | Freq: Once | INTRAVENOUS | Status: AC
  Administered 2019-08-31: 500 [IU]
  Filled 2019-08-31: qty 5

## 2019-08-31 MED ORDER — SODIUM CHLORIDE 0.9% FLUSH
10.0000 mL | Freq: Once | INTRAVENOUS | Status: AC
  Administered 2019-08-31: 10 mL
  Filled 2019-08-31: qty 10

## 2019-08-31 MED FILL — MAGIC MOUTHWASH D/M/L: 12 days supply | Qty: 240 | Fill #0

## 2019-08-31 MED FILL — CIPROFLOXACIN HCL 500 MG TA: 500 | 5 days supply | Qty: 10 | Fill #0

## 2019-08-31 NOTE — Patient Instructions (Signed)

## 2019-08-31 NOTE — Telephone Encounter (Signed)
Rx's for Cipro 500 mg for neutropenia and Magic Mouthwash called in per Wilber Bihari, NP to [place]. Pt is in agreement with this pharmacy, as it is the onlt one close by that can mix compound drug. Pt will pick up today; verbalized thanks and understanding.

## 2019-08-31 NOTE — Telephone Encounter (Signed)
Scheduled appt with Kathy Barker between visits that were already scheduled. Pt stated she would refer to mychart for any changes to her schedule.

## 2019-09-03 ENCOUNTER — Encounter: Payer: Self-pay | Admitting: Oncology

## 2019-09-05 ENCOUNTER — Encounter: Payer: Self-pay | Admitting: *Deleted

## 2019-09-06 ENCOUNTER — Ambulatory Visit: Payer: 59

## 2019-09-06 ENCOUNTER — Ambulatory Visit: Payer: 59 | Admitting: Medical

## 2019-09-06 ENCOUNTER — Other Ambulatory Visit: Payer: 59

## 2019-09-12 ENCOUNTER — Encounter: Payer: Self-pay | Admitting: *Deleted

## 2019-09-13 ENCOUNTER — Inpatient Hospital Stay: Payer: 59

## 2019-09-13 ENCOUNTER — Inpatient Hospital Stay (HOSPITAL_BASED_OUTPATIENT_CLINIC_OR_DEPARTMENT_OTHER): Payer: 59 | Admitting: Oncology

## 2019-09-13 ENCOUNTER — Other Ambulatory Visit: Payer: Self-pay

## 2019-09-13 VITALS — BP 139/77 | HR 84 | Temp 98.4°F | Resp 17

## 2019-09-13 VITALS — BP 130/68 | HR 87 | Temp 98.1°F | Resp 18 | Ht 62.5 in | Wt 134.1 lb

## 2019-09-13 DIAGNOSIS — C50312 Malignant neoplasm of lower-inner quadrant of left female breast: Secondary | ICD-10-CM

## 2019-09-13 DIAGNOSIS — Z170 Estrogen receptor positive status [ER+]: Secondary | ICD-10-CM

## 2019-09-13 DIAGNOSIS — Z5111 Encounter for antineoplastic chemotherapy: Secondary | ICD-10-CM | POA: Diagnosis not present

## 2019-09-13 DIAGNOSIS — Z95828 Presence of other vascular implants and grafts: Secondary | ICD-10-CM

## 2019-09-13 LAB — CBC WITH DIFFERENTIAL (CANCER CENTER ONLY)
Abs Immature Granulocytes: 0.04 10*3/uL (ref 0.00–0.07)
Basophils Absolute: 0 10*3/uL (ref 0.0–0.1)
Basophils Relative: 1 %
Eosinophils Absolute: 0 10*3/uL (ref 0.0–0.5)
Eosinophils Relative: 0 %
HCT: 29.4 % — ABNORMAL LOW (ref 36.0–46.0)
Hemoglobin: 9.8 g/dL — ABNORMAL LOW (ref 12.0–15.0)
Immature Granulocytes: 1 %
Lymphocytes Relative: 13 %
Lymphs Abs: 0.9 10*3/uL (ref 0.7–4.0)
MCH: 33.4 pg (ref 26.0–34.0)
MCHC: 33.3 g/dL (ref 30.0–36.0)
MCV: 100.3 fL — ABNORMAL HIGH (ref 80.0–100.0)
Monocytes Absolute: 0.6 10*3/uL (ref 0.1–1.0)
Monocytes Relative: 9 %
Neutro Abs: 4.8 10*3/uL (ref 1.7–7.7)
Neutrophils Relative %: 76 %
Platelet Count: 233 10*3/uL (ref 150–400)
RBC: 2.93 MIL/uL — ABNORMAL LOW (ref 3.87–5.11)
RDW: 18.8 % — ABNORMAL HIGH (ref 11.5–15.5)
WBC Count: 6.3 10*3/uL (ref 4.0–10.5)
nRBC: 0 % (ref 0.0–0.2)

## 2019-09-13 LAB — CMP (CANCER CENTER ONLY)
ALT: 29 U/L (ref 0–44)
AST: 21 U/L (ref 15–41)
Albumin: 3.8 g/dL (ref 3.5–5.0)
Alkaline Phosphatase: 90 U/L (ref 38–126)
Anion gap: 8 (ref 5–15)
BUN: 7 mg/dL (ref 6–20)
CO2: 25 mmol/L (ref 22–32)
Calcium: 9.7 mg/dL (ref 8.9–10.3)
Chloride: 107 mmol/L (ref 98–111)
Creatinine: 0.69 mg/dL (ref 0.44–1.00)
GFR, Est AFR Am: >60 mL/min (ref >60)
GFR, Estimated: >60 mL/min (ref >60)
Glucose, Bld: 126 mg/dL — ABNORMAL HIGH (ref 70–99)
Potassium: 3.8 mmol/L (ref 3.5–5.1)
Sodium: 140 mmol/L (ref 135–145)
Total Bilirubin: 0.3 mg/dL (ref 0.3–1.2)
Total Protein: 6.2 g/dL — ABNORMAL LOW (ref 6.5–8.1)

## 2019-09-13 MED ORDER — PROCHLORPERAZINE EDISYLATE 10 MG/2ML IJ SOLN
10.0000 mg | Freq: Once | INTRAMUSCULAR | Status: AC
  Administered 2019-09-13: 10 mg via INTRAVENOUS

## 2019-09-13 MED ORDER — PROCHLORPERAZINE EDISYLATE 10 MG/2ML IJ SOLN
INTRAMUSCULAR | Status: AC
  Filled 2019-09-13: qty 2

## 2019-09-13 MED ORDER — DIPHENHYDRAMINE HCL 50 MG/ML IJ SOLN
25.0000 mg | Freq: Once | INTRAMUSCULAR | Status: AC
  Administered 2019-09-13: 25 mg via INTRAVENOUS

## 2019-09-13 MED ORDER — SODIUM CHLORIDE 0.9 % IV SOLN
Freq: Once | INTRAVENOUS | Status: AC
  Filled 2019-09-13: qty 250

## 2019-09-13 MED ORDER — HEPARIN SOD (PORK) LOCK FLUSH 100 UNIT/ML IV SOLN
500.0000 [IU] | Freq: Once | INTRAVENOUS | Status: AC | PRN
  Administered 2019-09-13: 500 [IU]
  Filled 2019-09-13: qty 5

## 2019-09-13 MED ORDER — SODIUM CHLORIDE 0.9 % IV SOLN
20.0000 mg | Freq: Once | INTRAVENOUS | Status: AC
  Administered 2019-09-13: 20 mg via INTRAVENOUS
  Filled 2019-09-13: qty 20

## 2019-09-13 MED ORDER — FAMOTIDINE IN NACL 20-0.9 MG/50ML-% IV SOLN
INTRAVENOUS | Status: AC
  Filled 2019-09-13: qty 50

## 2019-09-13 MED ORDER — SODIUM CHLORIDE 0.9% FLUSH
10.0000 mL | Freq: Once | INTRAVENOUS | Status: AC
  Administered 2019-09-13: 10 mL
  Filled 2019-09-13: qty 10

## 2019-09-13 MED ORDER — FAMOTIDINE IN NACL 20-0.9 MG/50ML-% IV SOLN
20.0000 mg | Freq: Once | INTRAVENOUS | Status: AC
  Administered 2019-09-13: 20 mg via INTRAVENOUS

## 2019-09-13 MED ORDER — SODIUM CHLORIDE 0.9% FLUSH
10.0000 mL | INTRAVENOUS | Status: DC | PRN
  Administered 2019-09-13: 10 mL
  Filled 2019-09-13: qty 10

## 2019-09-13 MED ORDER — SODIUM CHLORIDE 0.9 % IV SOLN
80.0000 mg/m2 | Freq: Once | INTRAVENOUS | Status: AC
  Administered 2019-09-13: 126 mg via INTRAVENOUS
  Filled 2019-09-13: qty 21

## 2019-09-13 MED ORDER — SODIUM CHLORIDE 0.9 % IV SOLN
188.8000 mg | Freq: Once | INTRAVENOUS | Status: AC
  Administered 2019-09-13: 190 mg via INTRAVENOUS
  Filled 2019-09-13: qty 19

## 2019-09-13 MED ORDER — DIPHENHYDRAMINE HCL 50 MG/ML IJ SOLN
INTRAMUSCULAR | Status: AC
  Filled 2019-09-13: qty 1

## 2019-09-13 NOTE — Telephone Encounter (Signed)
No entry 

## 2019-09-13 NOTE — Progress Notes (Signed)
Pharmacist Chemotherapy Monitoring - Initial Assessment    Anticipated start date: 09/20/19   Regimen:  . Are orders appropriate based on the patient's diagnosis, regimen, and cycle? Yes . Does the plan date match the patient's scheduled date? Yes . Is the sequencing of drugs appropriate? Yes . Are the premedications appropriate for the patient's regimen? Yes . Prior Authorization for treatment is: Pending o If applicable, is the correct biosimilar selected based on the patient's insurance? not applicable  Organ Function and Labs: [name] Are dose adjustments needed based on the patient's renal function, hepatic function, or hematologic function? No . Are appropriate labs ordered prior to the start of patient's treatment? Yes . Other organ system assessment, if indicated: N/A . The following baseline labs, if indicated, have been ordered: N/A  Dose Assessment: . Are the drug doses appropriate? Yes . Are the following correct: o Drug concentrations Yes o IV fluid compatible with drug Yes o Administration routes Yes o Timing of therapy Yes . If applicable, does the patient have documented access for treatment and/or plans for port-a-cath placement? not applicable . If applicable, have lifetime cumulative doses been properly documented and assessed? not applicable Lifetime Dose Tracking  . Doxorubicin: 235.978 mg/m2 (384 mg) = 52.44 % of the maximum lifetime dose of 450 mg/m2  o   Toxicity Monitoring/Prevention: . The patient has the following take home antiemetics prescribed: N/A . The patient has the following take home medications prescribed: N/A . Medication allergies and previous infusion related reactions, if applicable, have been reviewed and addressed. Yes . The patient's current medication list has been assessed for drug-drug interactions with their chemotherapy regimen. no significant drug-drug interactions were identified on review.  Order Review: . Are the treatment plan orders  signed? No . Is the patient scheduled to see a provider prior to their treatment? Yes  I verify that I have reviewed each item in the above checklist and answered each question accordingly.  Kathy Barker K 09/13/2019 12:10 PM

## 2019-09-13 NOTE — Patient Instructions (Signed)
[facility] Discharge Instructions for Patients Receiving Chemotherapy  Today you received the following chemotherapy agents Paclitaxel (TAXOL) & Carboplatin (PARAPLATIN).  To help prevent nausea and vomiting after your treatment, we encourage you to take your nausea medication as prescribed.   If you develop nausea and vomiting that is not controlled by your nausea medication, call the clinic.   BELOW ARE SYMPTOMS THAT SHOULD BE REPORTED IMMEDIATELY:  *FEVER GREATER THAN 100.5 F  *CHILLS WITH OR WITHOUT FEVER  NAUSEA AND VOMITING THAT IS NOT CONTROLLED WITH YOUR NAUSEA MEDICATION  *UNUSUAL SHORTNESS OF BREATH  *UNUSUAL BRUISING OR BLEEDING  TENDERNESS IN MOUTH AND THROAT WITH OR WITHOUT PRESENCE OF ULCERS  *URINARY PROBLEMS  *BOWEL PROBLEMS  UNUSUAL RASH Items with * indicate a potential emergency and should be followed up as soon as possible.  Feel free to call the clinic should you have any questions or concerns. The clinic phone number is [phone].  Please show the CHEMO ALERT CARD at check-in to the Emergency Department and triage nurse.  Paclitaxel injection What is this medicine? PACLITAXEL (PAK li TAX el) is a chemotherapy drug. It targets fast dividing cells, like cancer cells, and causes these cells to die. This medicine is used to treat ovarian cancer, breast cancer, lung cancer, Kaposi's sarcoma, and other cancers. This medicine may be used for other purposes; ask your health care provider or pharmacist if you have questions. COMMON BRAND NAME(S): Onxol, Taxol What should I tell my health care provider before I take this medicine? They need to know if you have any of these conditions:  history of irregular heartbeat  liver disease  low blood counts, like low white cell, platelet, or red cell counts  lung or breathing disease, like asthma  tingling of the fingers or toes, or other nerve disorder  an unusual or allergic reaction to  paclitaxel, alcohol, polyoxyethylated castor oil, other chemotherapy, other medicines, foods, dyes, or preservatives  pregnant or trying to get pregnant  breast-feeding How should I use this medicine? This drug is given as an infusion into a vein. It is administered in a hospital or clinic by a specially trained health care professional. Talk to your pediatrician regarding the use of this medicine in children. Special care may be needed. Overdosage: If you think you have taken too much of this medicine contact a poison control center or emergency room at once. NOTE: This medicine is only for you. Do not share this medicine with others. What if I miss a dose? It is important not to miss your dose. Call your doctor or health care professional if you are unable to keep an appointment. What may interact with this medicine? Do not take this medicine with any of the following medications:  disulfiram  metronidazole This medicine may also interact with the following medications:  antiviral medicines for hepatitis, HIV or AIDS  certain antibiotics like erythromycin and clarithromycin  certain medicines for fungal infections like ketoconazole and itraconazole  certain medicines for seizures like carbamazepine, phenobarbital, phenytoin  gemfibrozil  nefazodone  rifampin  St. John's wort This list may not describe all possible interactions. Give your health care provider a list of all the medicines, herbs, non-prescription drugs, or dietary supplements you use. Also tell them if you smoke, drink alcohol, or use illegal drugs. Some items may interact with your medicine. What should I watch for while using this medicine? Your condition will be monitored carefully while you are receiving this medicine. You will   need important blood work done while you are taking this medicine. This medicine can cause serious allergic reactions. To reduce your risk you will need to take other medicine(s)  before treatment with this medicine. If you experience allergic reactions like skin rash, itching or hives, swelling of the face, lips, or tongue, tell your doctor or health care professional right away. In some cases, you may be given additional medicines to help with side effects. Follow all directions for their use. This drug may make you feel generally unwell. This is not uncommon, as chemotherapy can affect healthy cells as well as cancer cells. Report any side effects. Continue your course of treatment even though you feel ill unless your doctor tells you to stop. Call your doctor or health care professional for advice if you get a fever, chills or sore throat, or other symptoms of a cold or flu. Do not treat yourself. This drug decreases your body's ability to fight infections. Try to avoid being around people who are sick. This medicine may increase your risk to bruise or bleed. Call your doctor or health care professional if you notice any unusual bleeding. Be careful brushing and flossing your teeth or using a toothpick because you may get an infection or bleed more easily. If you have any dental work done, tell your dentist you are receiving this medicine. Avoid taking products that contain aspirin, acetaminophen, ibuprofen, naproxen, or ketoprofen unless instructed by your doctor. These medicines may hide a fever. Do not become pregnant while taking this medicine. Women should inform their doctor if they wish to become pregnant or think they might be pregnant. There is a potential for serious side effects to an unborn child. Talk to your health care professional or pharmacist for more information. Do not breast-feed an infant while taking this medicine. Men are advised not to father a child while receiving this medicine. This product may contain alcohol. Ask your pharmacist or healthcare provider if this medicine contains alcohol. Be sure to tell all healthcare providers you are taking this  medicine. Certain medicines, like metronidazole and disulfiram, can cause an unpleasant reaction when taken with alcohol. The reaction includes flushing, headache, nausea, vomiting, sweating, and increased thirst. The reaction can last from 30 minutes to several hours. What side effects may I notice from receiving this medicine? Side effects that you should report to your doctor or health care professional as soon as possible:  allergic reactions like skin rash, itching or hives, swelling of the face, lips, or tongue  breathing problems  changes in vision  fast, irregular heartbeat  high or low blood pressure  mouth sores  pain, tingling, numbness in the hands or feet  signs of decreased platelets or bleeding - bruising, pinpoint red spots on the skin, black, tarry stools, blood in the urine  signs of decreased red blood cells - unusually weak or tired, feeling faint or lightheaded, falls  signs of infection - fever or chills, cough, sore throat, pain or difficulty passing urine  signs and symptoms of liver injury like dark yellow or brown urine; general ill feeling or flu-like symptoms; light-colored stools; loss of appetite; nausea; right upper belly pain; unusually weak or tired; yellowing of the eyes or skin  swelling of the ankles, feet, hands  unusually slow heartbeat Side effects that usually do not require medical attention (report to your doctor or health care professional if they continue or are bothersome):  diarrhea  hair loss  loss of appetite  muscle   or joint pain  nausea, vomiting  pain, redness, or irritation at site where injected  tiredness This list may not describe all possible side effects. Call your doctor for medical advice about side effects. You may report side effects to FDA at 1-800-FDA-1088. Where should I keep my medicine? This drug is given in a hospital or clinic and will not be stored at home. NOTE: This sheet is a summary. It may not  cover all possible information. If you have questions about this medicine, talk to your doctor, pharmacist, or health care provider.  2020 [employer] Standard (2016-09-02 13:14:55)  Carboplatin injection What is this medicine? CARBOPLATIN (KAR boe pla tin) is a chemotherapy drug. It targets fast dividing cells, like cancer cells, and causes these cells to die. This medicine is used to treat ovarian cancer and many other cancers. This medicine may be used for other purposes; ask your health care provider or pharmacist if you have questions. COMMON BRAND NAME(S): Paraplatin What should I tell my health care provider before I take this medicine? They need to know if you have any of these conditions:  blood disorders  hearing problems  kidney disease  recent or ongoing radiation therapy  an unusual or allergic reaction to carboplatin, cisplatin, other chemotherapy, other medicines, foods, dyes, or preservatives  pregnant or trying to get pregnant  breast-feeding How should I use this medicine? This drug is usually given as an infusion into a vein. It is administered in a hospital or clinic by a specially trained health care professional. Talk to your pediatrician regarding the use of this medicine in children. Special care may be needed. Overdosage: If you think you have taken too much of this medicine contact a poison control center or emergency room at once. NOTE: This medicine is only for you. Do not share this medicine with others. What if I miss a dose? It is important not to miss a dose. Call your doctor or health care professional if you are unable to keep an appointment. What may interact with this medicine?  medicines for seizures  medicines to increase blood counts like filgrastim, pegfilgrastim, sargramostim  some antibiotics like amikacin, gentamicin, neomycin, streptomycin, tobramycin  vaccines Talk to your doctor or health care professional before taking any of  these medicines:  acetaminophen  aspirin  ibuprofen  ketoprofen  naproxen This list may not describe all possible interactions. Give your health care provider a list of all the medicines, herbs, non-prescription drugs, or dietary supplements you use. Also tell them if you smoke, drink alcohol, or use illegal drugs. Some items may interact with your medicine. What should I watch for while using this medicine? Your condition will be monitored carefully while you are receiving this medicine. You will need important blood work done while you are taking this medicine. This drug may make you feel generally unwell. This is not uncommon, as chemotherapy can affect healthy cells as well as cancer cells. Report any side effects. Continue your course of treatment even though you feel ill unless your doctor tells you to stop. In some cases, you may be given additional medicines to help with side effects. Follow all directions for their use. Call your doctor or health care professional for advice if you get a fever, chills or sore throat, or other symptoms of a cold or flu. Do not treat yourself. This drug decreases your body's ability to fight infections. Try to avoid being around people who are sick. This medicine may increase your risk   to bruise or bleed. Call your doctor or health care professional if you notice any unusual bleeding. Be careful brushing and flossing your teeth or using a toothpick because you may get an infection or bleed more easily. If you have any dental work done, tell your dentist you are receiving this medicine. Avoid taking products that contain aspirin, acetaminophen, ibuprofen, naproxen, or ketoprofen unless instructed by your doctor. These medicines may hide a fever. Do not become pregnant while taking this medicine. Women should inform their doctor if they wish to become pregnant or think they might be pregnant. There is a potential for serious side effects to an unborn child.  Talk to your health care professional or pharmacist for more information. Do not breast-feed an infant while taking this medicine. What side effects may I notice from receiving this medicine? Side effects that you should report to your doctor or health care professional as soon as possible:  allergic reactions like skin rash, itching or hives, swelling of the face, lips, or tongue  signs of infection - fever or chills, cough, sore throat, pain or difficulty passing urine  signs of decreased platelets or bleeding - bruising, pinpoint red spots on the skin, black, tarry stools, nosebleeds  signs of decreased red blood cells - unusually weak or tired, fainting spells, lightheadedness  breathing problems  changes in hearing  changes in vision  chest pain  high blood pressure  low blood counts - This drug may decrease the number of white blood cells, red blood cells and platelets. You may be at increased risk for infections and bleeding.  nausea and vomiting  pain, swelling, redness or irritation at the injection site  pain, tingling, numbness in the hands or feet  problems with balance, talking, walking  trouble passing urine or change in the amount of urine Side effects that usually do not require medical attention (report to your doctor or health care professional if they continue or are bothersome):  hair loss  loss of appetite  metallic taste in the mouth or changes in taste This list may not describe all possible side effects. Call your doctor for medical advice about side effects. You may report side effects to FDA at 1-800-FDA-1088. Where should I keep my medicine? This drug is given in a hospital or clinic and will not be stored at home. NOTE: This sheet is a summary. It may not cover all possible information. If you have questions about this medicine, talk to your doctor, pharmacist, or health care provider.  2020 [employer]/Gold Standard (2007-04-06 14:38:05)  

## 2019-09-13 NOTE — Progress Notes (Signed)
[facility]  [phone] Fax:(336) [phone]     ID: Kathy Barker DOB: [birth year]  MR#: [id number]  OAC#:[id number]  Patient Care Team: [name], MD as PCP - General (Family Medicine) Rockwell Germany, RN as Oncology Nurse Navigator Mauro Kaufmann, RN as Oncology Nurse Navigator [name], MD as Consulting Physician (General Surgery) Laramie Gelles, [name], MD as Consulting Physician (Oncology) [name], MD as Attending Physician (Radiation Oncology) Lavonna Monarch, MD as Consulting Physician (Dermatology) Chauncey Cruel, MD OTHER MD:  CHIEF COMPLAINT: Left-sided breast cancer  CURRENT TREATMENT: Neoadjuvant chemotherapy   INTERVAL HISTORY: Kathy Barker returns today for follow up and treatment of her left-sided breast cancer.   She completed neoadjvuant chematoherapy with doxorubicin and cyclophosphamide on 08/23/2019.  She normally would have started back on 09/06/2019 but she felt sufficiently worn down by treatment that we decided to postpone it until today.  She is now ready to begin weekly paclitaxel and carboplatin.   REVIEW OF SYSTEMS: Kathy Barker tells me that extra week made all the difference.  She slept a lot initially then felt a lot better.  Her outer lips are peeling a little bit but the inside is improving.  Her sense of taste is absolutely terrible.  She has to force herself to eat anything.  Juicing helps.  She is drinking quite a bit and hydrates herself well.  Aside from these issues the detailed review of systems today was stable   HISTORY OF CURRENT ILLNESS: From the original intake note:  Kathy Barker herself noted a palpable lower-inner left breast lump and immediately brought it to medical attention.. She underwent bilateral diagnostic mammography with tomography and left breast ultrasonography at [facility] on 05/30/2019 showing: breast density category B; 1.7 cm lobulated mass in left breast at 8 o'clock; no significant left  axillary abnormalities.  Accordingly on 06/01/2019 she proceeded to biopsy of the left breast area in question. The pathology from this procedure ([id number]) showed: invasive ductal carcinoma, grade 3. Prognostic indicators significant for: estrogen receptor, 70% positive with weak staining intensity and progesterone receptor, 0% negative. Proliferation marker Ki67 at 80%. HER2 negative by immunohistochemistry (1+).  The patient's subsequent history is as detailed below.   PAST MEDICAL HISTORY: Past Medical History:  Diagnosis Date  . Allergic rhinitis   . Anxiety    "only during my divorce" (11/18/2012)  . Cancer ([place])   . Depression    "only during my divorce" (11/18/2012)  . GERD (gastroesophageal reflux disease)   . High cholesterol   . Hot flashes   . IBS (irritable bowel syndrome)   . Insomnia   . Migraines    "lately more often; before SVT I'd get them ~ q 6 months" (11/18/2012)  . PONV (postoperative nausea and vomiting)   . 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 CESAREAN SECTION  1992  . COMBINED HYSTERECTOMY VAGINAL / OOPHORECTOMY / A&P REPAIR  1999  . IR IMAGING GUIDED PORT INSERTION  06/30/2019  . 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 SUPRAVENTRICULAR TACHYCARDIA ABLATION N/A 11/18/2012   Procedure: SUPRAVENTRICULAR TACHYCARDIA ABLATION;  Surgeon: Coralyn Mark, MD;  Location: [facility];  Service: Cardiovascular;  Laterality: N/A;  . VAGINAL HYSTERECTOMY  ~ 2000    FAMILY HISTORY: Family History  Problem Relation  Age of Onset  . Heart attack Father   . Alcohol abuse Father   . Heart attack Paternal Grandfather   . Heart Problems Other        both sides of family  . Breast cancer Mother    Her father died at age 65 from alcohol abuse. Her mother is living  at age 49 (as of 05/2019) and has a history of DCIS at age 1 and invasive lobular breast cancer at age 72. Dorla has one brother. She reports cancer of an unknown type in her maternal grandmother and prostate cancer in a maternal uncle.   GYNECOLOGIC HISTORY:  No LMP recorded. Patient has had a hysterectomy. Menarche: 59 years old Age at first live birth: 60 years old [place] P 2 LMP 2000 Contraceptive: never used HRT never used  Hysterectomy? yes BSO? 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  She will be on temporary disability during her chemotherapy.  She is divorced. She lives at home with son [name], age 45, who works in Biomedical scientist in [place]. Son [name], age 31, works in Sales executive here in [place]. 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     ADVANCED DIRECTIVES: Not in place. She intends to name both of her sons as her 49.   HEALTH MAINTENANCE: Social History   Tobacco Use  . Smoking status: Never Smoker  . Smokeless tobacco: Never Used  Vaping Use  . Vaping Use: Never used  Substance Use Topics  . Alcohol use: Yes    Comment: 11/18/2012 "shot of brandy couple times/month"  . Drug use: No     Colonoscopy: yes, date unsure  PAP: date unsure  Bone density: never done   Allergies  Allergen Reactions  . Fentanyl Nausea And Vomiting    Per patient " extreme nausea and vomiting "   . Other     Problems with stitches  . Versed [Midazolam] Nausea And Vomiting    Per patient " extreme N/V"   . Latex Rash    Current Outpatient Medications  Medication Sig Dispense Refill  . acyclovir (ZOVIRAX) 400 MG tablet Take 2 tablets tid for 10 days then decrease to 1 tablet twice a day 120 tablet 3  . ciprofloxacin (CIPRO) 500 MG tablet Take 1 tablet (500 mg total) by mouth 2 (two) times daily. 10 tablet 0  . dexamethasone (DECADRON) 4 MG tablet Take 2 tablets by mouth twice a day starting on  the day after chemotherapy (day 2), repeat the next day (day 3) then take 2 tablets in AM only day 4, then stop.  Take with food. 30 tablet 1  . lidocaine-prilocaine (EMLA) cream Apply to affected area once 30 g 3  . LIVALO 4 MG TABS Take 1 tablet by mouth daily.    [name] loratadine (CLARITIN) 10 MG tablet Take 1 tablet (10 mg total) by mouth daily. 60 tablet 3  . LORazepam (ATIVAN) 0.5 MG tablet Use 1 tab daily as needed for anxiety and or nausea. 30 tablet 0  . magic mouthwash w/lidocaine SOLN Take 5 mLs by mouth 4 (four) times daily as needed for mouth pain. 240 mL 1  . omeprazole (PRILOSEC) 40 MG capsule Take one capsule daily for 5 days starting on chemo day, then daily as needed 60 capsule 3  . prochlorperazine (COMPAZINE) 10 MG tablet Take 1 tablet (10 mg total) by mouth every 6 (six) hours as needed (  Nausea or vomiting). 30 tablet 1  . sertraline (ZOLOFT) 100 MG tablet Take 100 mg by mouth daily.    . SYMBICORT 160-4.5 MCG/ACT inhaler Inhale 2 puffs into the lungs 2 (two) times daily.    . temazepam (RESTORIL) 15 MG capsule Take 15 mg by mouth at bedtime.     No current facility-administered medications for this visit.   Facility-Administered Medications Ordered in Other Visits  Medication Dose Route Frequency Provider Last Rate Last Admin  . CARBOplatin (PARAPLATIN) 190 mg in sodium chloride 0.9 % 100 mL chemo infusion  190 mg Intravenous Once Blayze Haen, [name], MD      . heparin lock flush 100 unit/mL  500 Units Intracatheter Once PRN Wylee Dorantes, [name], MD      . sodium chloride flush (NS) 0.9 % injection 10 mL  10 mL Intracatheter PRN Jennika Ringgold, [name], MD        OBJECTIVE: White woman in no acute distress Vitals:   09/13/19 1314  BP: 130/68  Pulse: 87  Resp: 18  Temp: 98.1 F (36.7 C)  SpO2: 100%     Body mass index is 24.14 kg/m.   Wt Readings from Last 3 Encounters:  09/13/19 134 lb 1.6 oz (60.8 kg)  08/31/19 135 lb 9.6 oz (61.5 kg)  08/23/19 134 lb 11.2 oz (61.1 kg)        ECOG FS:1 - Symptomatic but completely ambulatory  Sclerae unicteric, EOMs intact Wearing a mask No cervical or supraclavicular adenopathy Lungs no rales or rhonchi Heart regular rate and rhythm Abd soft, nontender, positive bowel sounds MSK no focal spinal tenderness, no upper extremity lymphedema Neuro: nonfocal, well oriented, appropriate affect Breasts: The right breast is unremarkable.  I did no longer palpate a mass in the left breast.  Both axillae are benign.  LAB RESULTS:  CMP     Component Value Date/Time   NA 140 09/13/2019 1305   K 3.8 09/13/2019 1305   CL 107 09/13/2019 1305   CO2 25 09/13/2019 1305   GLUCOSE 126 (H) 09/13/2019 1305   BUN 7 09/13/2019 1305   CREATININE 0.69 09/13/2019 1305   CALCIUM 9.7 09/13/2019 1305   PROT 6.2 (L) 09/13/2019 1305   ALBUMIN 3.8 09/13/2019 1305   AST 21 09/13/2019 1305   ALT 29 09/13/2019 1305   ALKPHOS 90 09/13/2019 1305   BILITOT 0.3 09/13/2019 1305   GFRNONAA >60 09/13/2019 1305   GFRAA >60 09/13/2019 1305    No results found for: TOTALPROTELP, ALBUMINELP, A1GS, A2GS, BETS, BETA2SER, GAMS, MSPIKE, SPEI  Lab Results  Component Value Date   WBC 6.3 09/13/2019   NEUTROABS 4.8 09/13/2019   HGB 9.8 (L) 09/13/2019   HCT 29.4 (L) 09/13/2019   MCV 100.3 (H) 09/13/2019   PLT 233 09/13/2019    No results found for: LABCA2  No components found for: OVFIEP329  No results for input(s): INR in the last 168 hours.  No results found for: LABCA2  No results found for: JJO841  No results found for: YSA630  No results found for: ZSW109  No results found for: CA2729  No components found for: HGQUANT  No results found for: CEA1 / No results found for: CEA1   No results found for: AFPTUMOR  No results found for: CHROMOGRNA  No results found for: KPAFRELGTCHN, LAMBDASER, KAPLAMBRATIO (kappa/lambda light chains)  No results found for: HGBA, HGBA2QUANT, HGBFQUANT, HGBSQUAN (Hemoglobinopathy evaluation)   No  results found for: LDH  No results found for: IRON, TIBC,  IRONPCTSAT (Iron and TIBC)  No results found for: FERRITIN  Urinalysis No results found for: COLORURINE, APPEARANCEUR, LABSPEC, PHURINE, GLUCOSEU, HGBUR, BILIRUBINUR, KETONESUR, PROTEINUR, UROBILINOGEN, NITRITE, LEUKOCYTESUR   STUDIES: No results found.   ELIGIBLE FOR AVAILABLE RESEARCH PROTOCOL: no  ASSESSMENT: 59 y.o. [place] woman status post left breast lower inner quadrant biopsy 06/01/2019 for a clinical T1c N0, stage IB invasive ductal carcinoma, grade 3, weakly estrogen receptor positive but functionally triple negative, with an MIB-1 of 80%.  (1) Oncotype score of 51 predicts a risk of recurrence outside the breast in the next 9 years of greater than 39% if the patient's only systemic therapy is antiestrogens for 5 years.  It also predicts a greater than 15% benefit from chemotherapy  (2) neoadjuvant chemotherapy consisting of doxorubicin and cyclophosphamide in dose dense fashion x4 started 06/30/2019, completed 08/23/2019 to be followed by weekly paclitaxel and carboplatin weekly x12 starting 09/13/2019  (3) definitive surgery pending  (4) adjuvant radiation to follow: Consider capecitabine sensitization   PLAN: Ismael picked up a little steam by having that extra week off and was able to start her weekly treatments today.  She has a good understanding of the possible toxicities side effects and complications of these agents.  I asked her to continue to take the antiemetics at home just as before.  She has tolerated the Compazine and dexamethasone well and has had no nausea at all.  She will see one of our advanced practice providers next week just to make sure she tolerated the first cycle well and that there were no unusual complications or side effects.  I will then see her again with cycle #3 and after that we will see her every other cycle until she completes her treatments and proceed to surgery  She has a  good understanding of the overall plan.  She knows to call for any other issues that may develop before the next visit  Total encounter time 25 minutes.Sarajane Jews C. Rico Massar, MD 09/13/19 5:43 PM Medical Oncology and Hematology [facility] [place], [place] [address] Tel. [phone]    Fax. [phone]   I, Wilburn Mylar, am acting as scribe for Dr. [name]. Nakima Fluegge.  I, Lurline Del MD, have reviewed the above documentation for accuracy and completeness, and I agree with the above.    *Total Encounter Time as defined by the Centers for Medicare and Medicaid Services includes, in addition to the face-to-face time of a patient visit (documented in the note above) non-face-to-face time: obtaining and reviewing outside history, ordering and reviewing medications, tests or procedures, care coordination (communications with other health care professionals or caregivers) and documentation in the medical record.

## 2019-09-14 ENCOUNTER — Telehealth: Payer: Self-pay | Admitting: Nurse Practitioner

## 2019-09-14 ENCOUNTER — Other Ambulatory Visit: Payer: Self-pay | Admitting: Oncology

## 2019-09-14 DIAGNOSIS — C50312 Malignant neoplasm of lower-inner quadrant of left female breast: Secondary | ICD-10-CM

## 2019-09-14 NOTE — Telephone Encounter (Signed)
Scheduled follow up with APP per 8/31 los. Made no changes to pt's arrival time.

## 2019-09-15 ENCOUNTER — Other Ambulatory Visit: Payer: Self-pay | Admitting: Oncology

## 2019-09-15 DIAGNOSIS — C50312 Malignant neoplasm of lower-inner quadrant of left female breast: Secondary | ICD-10-CM

## 2019-09-15 MED ORDER — LORAZEPAM 0.5 MG PO TABS: ORAL_TABLET | ORAL | 0 refills | Status: DC

## 2019-09-15 NOTE — Telephone Encounter (Signed)
Please review and refill if appropriate 

## 2019-09-20 ENCOUNTER — Ambulatory Visit: Payer: 59 | Admitting: Nurse Practitioner

## 2019-09-20 ENCOUNTER — Inpatient Hospital Stay: Payer: 59

## 2019-09-20 ENCOUNTER — Inpatient Hospital Stay: Payer: 59 | Attending: Oncology

## 2019-09-20 DIAGNOSIS — G629 Polyneuropathy, unspecified: Secondary | ICD-10-CM | POA: Insufficient documentation

## 2019-09-20 DIAGNOSIS — Z5111 Encounter for antineoplastic chemotherapy: Secondary | ICD-10-CM | POA: Insufficient documentation

## 2019-09-20 DIAGNOSIS — C50312 Malignant neoplasm of lower-inner quadrant of left female breast: Secondary | ICD-10-CM | POA: Insufficient documentation

## 2019-09-20 DIAGNOSIS — Z170 Estrogen receptor positive status [ER+]: Secondary | ICD-10-CM | POA: Insufficient documentation

## 2019-09-20 DIAGNOSIS — Z8249 Family history of ischemic heart disease and other diseases of the circulatory system: Secondary | ICD-10-CM | POA: Insufficient documentation

## 2019-09-20 DIAGNOSIS — Z79899 Other long term (current) drug therapy: Secondary | ICD-10-CM | POA: Insufficient documentation

## 2019-09-20 DIAGNOSIS — Z803 Family history of malignant neoplasm of breast: Secondary | ICD-10-CM | POA: Insufficient documentation

## 2019-09-26 NOTE — Progress Notes (Signed)
[place]  [phone]) [phone] [phone]     ID: Kathy Barker DOB: April 18, 1960  MR#: [id number]  VQQ#:[id number]  Patient Care Team: [name], MD as PCP - General (Family Medicine) Rockwell Germany, RN as Oncology Nurse Navigator Mauro Kaufmann, RN as Oncology Nurse Navigator [name], MD as Consulting Physician (General Surgery) Merlen Gurry, [name], MD as Consulting Physician (Oncology) [name], MD as Attending Physician (Radiation Oncology) Lavonna Monarch, MD as Consulting Physician (Dermatology) Chauncey Cruel, MD OTHER MD:  CHIEF COMPLAINT: Left-sided breast cancer  CURRENT TREATMENT: Neoadjuvant chemotherapy   INTERVAL HISTORY: [name] returns today for follow up and treatment of her left-sided breast cancer.   She began weekly paclitaxel and carboplatin on 09/13/2019.  She did remarkably well with her first cycle except for the Benadryl which caused her significant problems with jumpy legs and feeling revved up.  She had no first dose bony aches or immune reactions.  She was scheduled for the second dose on 09/20/2019, to meet with APP [place], but had significant diarrhea that day and did not think she could sit long enough to receive treatment.  She tells me that she called and told someone to cancel that visit and those labs, but there is no record of that.  At any rate the diarrhea has resolved and she is ready to resume treatment today   REVIEW OF SYSTEMS: Kathy Barker is walking for exercise.  She has no sense of taste, and this makes herself eat.  She has had no significant nausea from the treatments and the only nausea medicine she is taking at home is dexamethasone days 2 and 3.  Overall she feels much better than she did when getting the earlier chemo treatments.  A detailed review of systems was otherwise stable   HISTORY OF CURRENT ILLNESS: From the original intake note:  Kathy Barker herself noted a palpable  lower-inner left breast lump and immediately brought it to medical attention.. She underwent bilateral diagnostic mammography with tomography and left breast ultrasonography at [facility] on 05/30/2019 showing: breast density category B; 1.7 cm lobulated mass in left breast at 8 o'clock; no significant left axillary abnormalities.  Accordingly on 06/01/2019 she proceeded to biopsy of the left breast area in question. The pathology from this procedure ([id number]) showed: invasive ductal carcinoma, grade 3. Prognostic indicators significant for: estrogen receptor, 70% positive with weak staining intensity and progesterone receptor, 0% negative. Proliferation marker Ki67 at 80%. HER2 negative by immunohistochemistry (1+).  The patient's subsequent history is as detailed below.   PAST MEDICAL HISTORY: Past Medical History:  Diagnosis Date  . Allergic rhinitis   . Anxiety    "only during my divorce" (11/18/2012)  . Cancer ([place])   . Depression    "only during my divorce" (11/18/2012)  . GERD (gastroesophageal reflux disease)   . High cholesterol   . Hot flashes   . IBS (irritable bowel syndrome)   . Insomnia   . Migraines    "lately more often; before SVT I'd get them ~ q 6 months" (11/18/2012)  . PONV (postoperative nausea and vomiting)   . 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 CESAREAN SECTION  1992  . COMBINED HYSTERECTOMY VAGINAL / OOPHORECTOMY / A&P REPAIR  1999  . IR IMAGING GUIDED PORT INSERTION  06/30/2019  . SUPRAVENTRICULAR TACHYCARDIA  ABLATION  11-18-2012   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  . SUPRAVENTRICULAR TACHYCARDIA ABLATION N/A 11/18/2012   Procedure: SUPRAVENTRICULAR TACHYCARDIA ABLATION;  Surgeon: Coralyn Mark, MD;  Location: [facility] CATH LAB;  Service: Cardiovascular;  Laterality: N/A;  . VAGINAL HYSTERECTOMY  ~ 2000     FAMILY HISTORY: Family History  Problem Relation Age of Onset  . Heart attack Father   . Alcohol abuse Father   . Heart attack Paternal Grandfather   . Heart Problems Other        both sides of family  . Breast cancer Mother    Her father died at age 14 from alcohol abuse. Her mother is living at age 39 (as of 05/2019) and has a history of DCIS at age 30 and invasive lobular breast cancer at age 49. Keairra has one brother. She reports cancer of an unknown type in her maternal grandmother and prostate cancer in a maternal uncle.   GYNECOLOGIC HISTORY:  No LMP recorded. Patient has had a hysterectomy. Menarche: 59 years old Age at first live birth: 59 years old [place] P 2 LMP 2000 Contraceptive: never used HRT never used  Hysterectomy? yes BSO? 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  She will be on temporary disability during her chemotherapy.  She is divorced. She lives at home with son [name], age 58, who works in Biomedical scientist in [place]. Son [name], age 28, works in Sales executive here in [place]. 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     ADVANCED DIRECTIVES: Not in place. She intends to name both of her sons as her 45.   HEALTH MAINTENANCE: Social History   Tobacco Use  . Smoking status: Never Smoker  . Smokeless tobacco: Never Used  Vaping Use  . Vaping Use: Never used  Substance Use Topics  . Alcohol use: Yes    Comment: 11/18/2012 "shot of brandy couple times/month"  . Drug use: No     Colonoscopy: yes, date unsure  PAP: date unsure  Bone density: never done   Allergies  Allergen Reactions  . Fentanyl Nausea And Vomiting    Per patient " extreme nausea and vomiting "   . Other     Problems with stitches  . Versed [Midazolam] Nausea And Vomiting    Per patient " extreme N/V"   . Latex Rash    Current Outpatient Medications  Medication Sig Dispense Refill  .  acyclovir (ZOVIRAX) 400 MG tablet Take 2 tablets tid for 10 days then decrease to 1 tablet twice a day 120 tablet 3  . ciprofloxacin (CIPRO) 500 MG tablet Take 1 tablet (500 mg total) by mouth 2 (two) times daily. 10 tablet 0  . dexamethasone (DECADRON) 4 MG tablet Take 2 tablets by mouth twice a day starting on the day after chemotherapy (day 2), repeat the next day (day 3) then take 2 tablets in AM only day 4, then stop.  Take with food. 30 tablet 1  . lidocaine-prilocaine (EMLA) cream Apply to affected area once 30 g 3  . LIVALO 4 MG TABS Take 1 tablet by mouth daily.    [name] loratadine (CLARITIN) 10 MG tablet Take 1 tablet (10 mg total) by mouth daily. 60 tablet 3  . LORazepam (ATIVAN) 0.5 MG tablet Use 1 tab daily as needed for anxiety and or nausea. 30 tablet 0  . magic mouthwash w/lidocaine SOLN Take 5 mLs  by mouth 4 (four) times daily as needed for mouth pain. 240 mL 1  . omeprazole (PRILOSEC) 40 MG capsule Take one capsule daily for 5 days starting on chemo day, then daily as needed 60 capsule 3  . prochlorperazine (COMPAZINE) 10 MG tablet Take 1 tablet (10 mg total) by mouth every 6 (six) hours as needed (Nausea or vomiting). 30 tablet 1  . sertraline (ZOLOFT) 100 MG tablet Take 100 mg by mouth daily.    . SYMBICORT 160-4.5 MCG/ACT inhaler Inhale 2 puffs into the lungs 2 (two) times daily.    . temazepam (RESTORIL) 15 MG capsule Take 15 mg by mouth at bedtime.     No current facility-administered medications for this visit.    OBJECTIVE: White woman who appears well Vitals:   09/27/19 0834  BP: 136/74  Pulse: 95  Resp: 18  Temp: 97.6 F (36.4 C)  SpO2: 100%     Body mass index is 24.77 kg/m.   Wt Readings from Last 3 Encounters:  09/27/19 137 lb 9.6 oz (62.4 kg)  09/13/19 134 lb 1.6 oz (60.8 kg)  08/31/19 135 lb 9.6 oz (61.5 kg)      ECOG FS:1 - Symptomatic but completely ambulatory  Sclerae unicteric, EOMs intact Wearing a mask No cervical or supraclavicular  adenopathy Lungs no rales or rhonchi Heart regular rate and rhythm Abd soft, nontender, positive bowel sounds MSK no focal spinal tenderness, no upper extremity lymphedema Neuro: nonfocal, well oriented, appropriate affect Breasts: I do not palpate a mass in the left breast.  Both axillae are benign.   LAB RESULTS:  CMP     Component Value Date/Time   NA 143 09/27/2019 0816   K 4.1 09/27/2019 0816   CL 108 09/27/2019 0816   CO2 24 09/27/2019 0816   GLUCOSE 118 (H) 09/27/2019 0816   BUN 10 09/27/2019 0816   CREATININE 0.69 09/27/2019 0816   CALCIUM 9.0 09/27/2019 0816   PROT 6.3 (L) 09/27/2019 0816   ALBUMIN 3.7 09/27/2019 0816   AST 25 09/27/2019 0816   ALT 37 09/27/2019 0816   ALKPHOS 116 09/27/2019 0816   BILITOT 0.3 09/27/2019 0816   GFRNONAA >60 09/27/2019 0816   GFRAA >60 09/27/2019 0816    No results found for: TOTALPROTELP, ALBUMINELP, A1GS, A2GS, BETS, BETA2SER, GAMS, MSPIKE, SPEI  Lab Results  Component Value Date   WBC 5.0 09/27/2019   NEUTROABS 3.4 09/27/2019   HGB 11.0 (L) 09/27/2019   HCT 33.9 (L) 09/27/2019   MCV 101.2 (H) 09/27/2019   PLT 147 (L) 09/27/2019    No results found for: LABCA2  No components found for: GLOVFI433  No results for input(s): INR in the last 168 hours.  No results found for: LABCA2  No results found for: IRJ188  No results found for: CZY606  No results found for: TKZ601  No results found for: CA2729  No components found for: HGQUANT  No results found for: CEA1 / No results found for: CEA1   No results found for: AFPTUMOR  No results found for: CHROMOGRNA  No results found for: KPAFRELGTCHN, LAMBDASER, KAPLAMBRATIO (kappa/lambda light chains)  No results found for: HGBA, HGBA2QUANT, HGBFQUANT, HGBSQUAN (Hemoglobinopathy evaluation)   No results found for: LDH  No results found for: IRON, TIBC, IRONPCTSAT (Iron and TIBC)  No results found for: FERRITIN  Urinalysis No results found for: COLORURINE,  APPEARANCEUR, LABSPEC, PHURINE, GLUCOSEU, HGBUR, BILIRUBINUR, KETONESUR, PROTEINUR, UROBILINOGEN, NITRITE, LEUKOCYTESUR   STUDIES: No results found.   ELIGIBLE FOR  AVAILABLE RESEARCH PROTOCOL: no  ASSESSMENT: 59 y.o. [place] woman status post left breast lower inner quadrant biopsy 06/01/2019 for a clinical T1c N0, stage IB invasive ductal carcinoma, grade 3, weakly estrogen receptor positive but functionally triple negative, with an MIB-1 of 80%.  (1) Oncotype score of 51 predicts a risk of recurrence outside the breast in the next 9 years of greater than 39% if the patient's only systemic therapy is antiestrogens for 5 years.  It also predicts a greater than 15% benefit from chemotherapy  (2) neoadjuvant chemotherapy consisting of doxorubicin and cyclophosphamide in dose dense fashion x4 started 06/30/2019, completed 08/23/2019, followed by weekly paclitaxel and carboplatin weekly x12 starting 09/13/2019  (3) definitive surgery pending  (4) adjuvant radiation to follow: Consider capecitabine sensitization   PLAN: Addelyn did very well with her first carboplatin/paclitaxel treatment.  The second treatment was delayed for reasons not related to treatment, namely intervening diarrhea, which has completely resolved and appears to have been related to some food at that she ate.  She is not ready to proceed.  She did tolerate the Benadryl very poorly and I have cut the dose in half.  Also since she had absolutely no nausea with treatment #1 we are dropping the dexamethasone dose at home to 4 mg twice daily days 2 and 3 only.  She also has Compazine as needed  We are moving her treatments out to Wednesdays beginning with September 29 because my 86 assistant is not here on Tuesdays.  We will be seeing her on that date and every second treatment until she reaches her eighth treatment after which we will see her on a weekly basis  She was again alerted to let us know of any peripheral  neuropathy symptoms  Total encounter time 35 minutes.Sarajane Jews C. Jamilee Lafosse, MD 09/27/19 8:56 AM Medical Oncology and Hematology [facility] [place], [place] [address] Tel. [phone]    Fax. [phone]   I, Wilburn Mylar, am acting as scribe for Dr. [name]. Rolando Hessling.  I, Lurline Del MD, have reviewed the above documentation for accuracy and completeness, and I agree with the above.    *Total Encounter Time as defined by the Centers for Medicare and Medicaid Services includes, in addition to the face-to-face time of a patient visit (documented in the note above) non-face-to-face time: obtaining and reviewing outside history, ordering and reviewing medications, tests or procedures, care coordination (communications with other health care professionals or caregivers) and documentation in the medical record.

## 2019-09-27 ENCOUNTER — Other Ambulatory Visit: Payer: Self-pay

## 2019-09-27 ENCOUNTER — Inpatient Hospital Stay: Payer: 59

## 2019-09-27 ENCOUNTER — Telehealth: Payer: Self-pay | Admitting: Oncology

## 2019-09-27 ENCOUNTER — Inpatient Hospital Stay (HOSPITAL_BASED_OUTPATIENT_CLINIC_OR_DEPARTMENT_OTHER): Payer: 59 | Admitting: Oncology

## 2019-09-27 VITALS — BP 136/74 | HR 95 | Temp 97.6°F | Resp 18 | Ht 62.5 in | Wt 137.6 lb

## 2019-09-27 DIAGNOSIS — Z5111 Encounter for antineoplastic chemotherapy: Secondary | ICD-10-CM | POA: Diagnosis not present

## 2019-09-27 DIAGNOSIS — C50312 Malignant neoplasm of lower-inner quadrant of left female breast: Secondary | ICD-10-CM

## 2019-09-27 DIAGNOSIS — G629 Polyneuropathy, unspecified: Secondary | ICD-10-CM | POA: Diagnosis not present

## 2019-09-27 DIAGNOSIS — Z170 Estrogen receptor positive status [ER+]: Secondary | ICD-10-CM | POA: Diagnosis not present

## 2019-09-27 DIAGNOSIS — Z95828 Presence of other vascular implants and grafts: Secondary | ICD-10-CM

## 2019-09-27 DIAGNOSIS — Z8249 Family history of ischemic heart disease and other diseases of the circulatory system: Secondary | ICD-10-CM | POA: Diagnosis not present

## 2019-09-27 DIAGNOSIS — Z803 Family history of malignant neoplasm of breast: Secondary | ICD-10-CM | POA: Diagnosis not present

## 2019-09-27 DIAGNOSIS — Z79899 Other long term (current) drug therapy: Secondary | ICD-10-CM | POA: Diagnosis not present

## 2019-09-27 LAB — CBC WITH DIFFERENTIAL (CANCER CENTER ONLY)
Abs Immature Granulocytes: 0.02 10*3/uL (ref 0.00–0.07)
Basophils Absolute: 0 10*3/uL (ref 0.0–0.1)
Basophils Relative: 1 %
Eosinophils Absolute: 0.1 10*3/uL (ref 0.0–0.5)
Eosinophils Relative: 2 %
HCT: 33.9 % — ABNORMAL LOW (ref 36.0–46.0)
Hemoglobin: 11 g/dL — ABNORMAL LOW (ref 12.0–15.0)
Immature Granulocytes: 0 %
Lymphocytes Relative: 18 %
Lymphs Abs: 0.9 10*3/uL (ref 0.7–4.0)
MCH: 32.8 pg (ref 26.0–34.0)
MCHC: 32.4 g/dL (ref 30.0–36.0)
MCV: 101.2 fL — ABNORMAL HIGH (ref 80.0–100.0)
Monocytes Absolute: 0.6 10*3/uL (ref 0.1–1.0)
Monocytes Relative: 11 %
Neutro Abs: 3.4 10*3/uL (ref 1.7–7.7)
Neutrophils Relative %: 68 %
Platelet Count: 147 10*3/uL — ABNORMAL LOW (ref 150–400)
RBC: 3.35 MIL/uL — ABNORMAL LOW (ref 3.87–5.11)
RDW: 16.4 % — ABNORMAL HIGH (ref 11.5–15.5)
WBC Count: 5 10*3/uL (ref 4.0–10.5)
nRBC: 0 % (ref 0.0–0.2)

## 2019-09-27 LAB — CMP (CANCER CENTER ONLY)
ALT: 37 U/L (ref 0–44)
AST: 25 U/L (ref 15–41)
Albumin: 3.7 g/dL (ref 3.5–5.0)
Alkaline Phosphatase: 116 U/L (ref 38–126)
Anion gap: 11 (ref 5–15)
BUN: 10 mg/dL (ref 6–20)
CO2: 24 mmol/L (ref 22–32)
Calcium: 9 mg/dL (ref 8.9–10.3)
Chloride: 108 mmol/L (ref 98–111)
Creatinine: 0.69 mg/dL (ref 0.44–1.00)
GFR, Est AFR Am: >60 mL/min (ref >60)
GFR, Estimated: >60 mL/min (ref >60)
Glucose, Bld: 118 mg/dL — ABNORMAL HIGH (ref 70–99)
Potassium: 4.1 mmol/L (ref 3.5–5.1)
Sodium: 143 mmol/L (ref 135–145)
Total Bilirubin: 0.3 mg/dL (ref 0.3–1.2)
Total Protein: 6.3 g/dL — ABNORMAL LOW (ref 6.5–8.1)

## 2019-09-27 MED ORDER — DIPHENHYDRAMINE HCL 50 MG/ML IJ SOLN
12.5000 mg | Freq: Once | INTRAMUSCULAR | Status: AC
  Administered 2019-09-27: 12.5 mg via INTRAVENOUS

## 2019-09-27 MED ORDER — SODIUM CHLORIDE 0.9% FLUSH
10.0000 mL | Freq: Once | INTRAVENOUS | Status: AC
  Administered 2019-09-27: 10 mL
  Filled 2019-09-27: qty 10

## 2019-09-27 MED ORDER — SODIUM CHLORIDE 0.9 % IV SOLN
Freq: Once | INTRAVENOUS | Status: AC
  Filled 2019-09-27: qty 250

## 2019-09-27 MED ORDER — FAMOTIDINE IN NACL 20-0.9 MG/50ML-% IV SOLN
20.0000 mg | Freq: Once | INTRAVENOUS | Status: AC
  Administered 2019-09-27: 20 mg via INTRAVENOUS

## 2019-09-27 MED ORDER — PROCHLORPERAZINE EDISYLATE 10 MG/2ML IJ SOLN
INTRAMUSCULAR | Status: AC
  Filled 2019-09-27: qty 2

## 2019-09-27 MED ORDER — SODIUM CHLORIDE 0.9 % IV SOLN
188.8000 mg | Freq: Once | INTRAVENOUS | Status: AC
  Administered 2019-09-27: 190 mg via INTRAVENOUS
  Filled 2019-09-27: qty 19

## 2019-09-27 MED ORDER — SODIUM CHLORIDE 0.9% FLUSH
10.0000 mL | INTRAVENOUS | Status: DC | PRN
  Administered 2019-09-27: 10 mL
  Filled 2019-09-27: qty 10

## 2019-09-27 MED ORDER — PROCHLORPERAZINE EDISYLATE 10 MG/2ML IJ SOLN
10.0000 mg | Freq: Once | INTRAMUSCULAR | Status: AC
  Administered 2019-09-27: 10 mg via INTRAVENOUS

## 2019-09-27 MED ORDER — SODIUM CHLORIDE 0.9 % IV SOLN
20.0000 mg | Freq: Once | INTRAVENOUS | Status: AC
  Administered 2019-09-27: 20 mg via INTRAVENOUS
  Filled 2019-09-27: qty 20
  Filled 2019-09-27: qty 2

## 2019-09-27 MED ORDER — SODIUM CHLORIDE 0.9 % IV SOLN
80.0000 mg/m2 | Freq: Once | INTRAVENOUS | Status: AC
  Administered 2019-09-27: 126 mg via INTRAVENOUS
  Filled 2019-09-27: qty 21

## 2019-09-27 MED ORDER — FAMOTIDINE IN NACL 20-0.9 MG/50ML-% IV SOLN
INTRAVENOUS | Status: AC
  Filled 2019-09-27: qty 50

## 2019-09-27 MED ORDER — DIPHENHYDRAMINE HCL 50 MG/ML IJ SOLN
INTRAMUSCULAR | Status: AC
  Filled 2019-09-27: qty 1

## 2019-09-27 MED ORDER — HEPARIN SOD (PORK) LOCK FLUSH 100 UNIT/ML IV SOLN
500.0000 [IU] | Freq: Once | INTRAVENOUS | Status: AC | PRN
  Administered 2019-09-27: 500 [IU]
  Filled 2019-09-27: qty 5

## 2019-09-27 NOTE — Patient Instructions (Signed)
[facility] Discharge Instructions for Patients Receiving Chemotherapy  Today you received the following chemotherapy agents Paclitaxel (TAXOL) & Carboplatin (PARAPLATIN).  To help prevent nausea and vomiting after your treatment, we encourage you to take your nausea medication as prescribed.  If you develop nausea and vomiting that is not controlled by your nausea medication, call the clinic.   BELOW ARE SYMPTOMS THAT SHOULD BE REPORTED IMMEDIATELY:  *FEVER GREATER THAN 100.5 F  *CHILLS WITH OR WITHOUT FEVER  NAUSEA AND VOMITING THAT IS NOT CONTROLLED WITH YOUR NAUSEA MEDICATION  *UNUSUAL SHORTNESS OF BREATH  *UNUSUAL BRUISING OR BLEEDING  TENDERNESS IN MOUTH AND THROAT WITH OR WITHOUT PRESENCE OF ULCERS  *URINARY PROBLEMS  *BOWEL PROBLEMS  UNUSUAL RASH Items with * indicate a potential emergency and should be followed up as soon as possible.  Feel free to call the clinic should you have any questions or concerns. The clinic phone number is [phone].  Please show the CHEMO ALERT CARD at check-in to the Emergency Department and triage nurse.   

## 2019-09-27 NOTE — Patient Instructions (Signed)

## 2019-09-27 NOTE — Telephone Encounter (Signed)
Scheduled appts per 9/14 los. Left voicemail with appt details.

## 2019-10-04 ENCOUNTER — Inpatient Hospital Stay: Payer: 59

## 2019-10-04 ENCOUNTER — Other Ambulatory Visit: Payer: Self-pay

## 2019-10-04 VITALS — BP 148/78 | HR 86 | Temp 98.6°F | Resp 18 | Wt 136.0 lb

## 2019-10-04 DIAGNOSIS — Z170 Estrogen receptor positive status [ER+]: Secondary | ICD-10-CM

## 2019-10-04 DIAGNOSIS — C50312 Malignant neoplasm of lower-inner quadrant of left female breast: Secondary | ICD-10-CM

## 2019-10-04 DIAGNOSIS — Z95828 Presence of other vascular implants and grafts: Secondary | ICD-10-CM

## 2019-10-04 DIAGNOSIS — Z5111 Encounter for antineoplastic chemotherapy: Secondary | ICD-10-CM | POA: Diagnosis not present

## 2019-10-04 LAB — CBC WITH DIFFERENTIAL (CANCER CENTER ONLY)
Abs Immature Granulocytes: 0.09 10*3/uL — ABNORMAL HIGH (ref 0.00–0.07)
Basophils Absolute: 0 10*3/uL (ref 0.0–0.1)
Basophils Relative: 0 %
Eosinophils Absolute: 0.1 10*3/uL (ref 0.0–0.5)
Eosinophils Relative: 1 %
HCT: 34.6 % — ABNORMAL LOW (ref 36.0–46.0)
Hemoglobin: 11.6 g/dL — ABNORMAL LOW (ref 12.0–15.0)
Immature Granulocytes: 1 %
Lymphocytes Relative: 19 %
Lymphs Abs: 1.5 10*3/uL (ref 0.7–4.0)
MCH: 33 pg (ref 26.0–34.0)
MCHC: 33.5 g/dL (ref 30.0–36.0)
MCV: 98.6 fL (ref 80.0–100.0)
Monocytes Absolute: 0.4 10*3/uL (ref 0.1–1.0)
Monocytes Relative: 5 %
Neutro Abs: 5.5 10*3/uL (ref 1.7–7.7)
Neutrophils Relative %: 74 %
Platelet Count: 163 10*3/uL (ref 150–400)
RBC: 3.51 MIL/uL — ABNORMAL LOW (ref 3.87–5.11)
RDW: 15 % (ref 11.5–15.5)
WBC Count: 7.5 10*3/uL (ref 4.0–10.5)
nRBC: 0 % (ref 0.0–0.2)

## 2019-10-04 LAB — CMP (CANCER CENTER ONLY)
ALT: 38 U/L (ref 0–44)
AST: 21 U/L (ref 15–41)
Albumin: 3.9 g/dL (ref 3.5–5.0)
Alkaline Phosphatase: 121 U/L (ref 38–126)
Anion gap: 6 (ref 5–15)
BUN: 12 mg/dL (ref 6–20)
CO2: 26 mmol/L (ref 22–32)
Calcium: 9.5 mg/dL (ref 8.9–10.3)
Chloride: 107 mmol/L (ref 98–111)
Creatinine: 0.71 mg/dL (ref 0.44–1.00)
GFR, Est AFR Am: >60 mL/min (ref >60)
GFR, Estimated: >60 mL/min (ref >60)
Glucose, Bld: 89 mg/dL (ref 70–99)
Potassium: 3.9 mmol/L (ref 3.5–5.1)
Sodium: 139 mmol/L (ref 135–145)
Total Bilirubin: 0.3 mg/dL (ref 0.3–1.2)
Total Protein: 6.7 g/dL (ref 6.5–8.1)

## 2019-10-04 MED ORDER — SODIUM CHLORIDE 0.9% FLUSH
10.0000 mL | Freq: Once | INTRAVENOUS | Status: AC
  Administered 2019-10-04: 10 mL
  Filled 2019-10-04: qty 10

## 2019-10-04 MED ORDER — SODIUM CHLORIDE 0.9 % IV SOLN
80.0000 mg/m2 | Freq: Once | INTRAVENOUS | Status: AC
  Administered 2019-10-04: 126 mg via INTRAVENOUS
  Filled 2019-10-04: qty 21

## 2019-10-04 MED ORDER — SODIUM CHLORIDE 0.9 % IV SOLN
20.0000 mg | Freq: Once | INTRAVENOUS | Status: AC
  Administered 2019-10-04: 20 mg via INTRAVENOUS
  Filled 2019-10-04: qty 20

## 2019-10-04 MED ORDER — DIPHENHYDRAMINE HCL 50 MG/ML IJ SOLN
INTRAMUSCULAR | Status: AC
  Filled 2019-10-04: qty 1

## 2019-10-04 MED ORDER — HEPARIN SOD (PORK) LOCK FLUSH 100 UNIT/ML IV SOLN
500.0000 [IU] | Freq: Once | INTRAVENOUS | Status: AC | PRN
  Administered 2019-10-04: 500 [IU]
  Filled 2019-10-04: qty 5

## 2019-10-04 MED ORDER — FAMOTIDINE IN NACL 20-0.9 MG/50ML-% IV SOLN
20.0000 mg | Freq: Once | INTRAVENOUS | Status: AC
  Administered 2019-10-04: 20 mg via INTRAVENOUS

## 2019-10-04 MED ORDER — SODIUM CHLORIDE 0.9% FLUSH
10.0000 mL | INTRAVENOUS | Status: DC | PRN
  Administered 2019-10-04: 10 mL
  Filled 2019-10-04: qty 10

## 2019-10-04 MED ORDER — DIPHENHYDRAMINE HCL 50 MG/ML IJ SOLN
12.5000 mg | Freq: Once | INTRAMUSCULAR | Status: AC
  Administered 2019-10-04: 12.5 mg via INTRAVENOUS

## 2019-10-04 MED ORDER — PROCHLORPERAZINE EDISYLATE 10 MG/2ML IJ SOLN
10.0000 mg | Freq: Once | INTRAMUSCULAR | Status: AC
  Administered 2019-10-04: 10 mg via INTRAVENOUS

## 2019-10-04 MED ORDER — PROCHLORPERAZINE EDISYLATE 10 MG/2ML IJ SOLN
INTRAMUSCULAR | Status: AC
  Filled 2019-10-04: qty 2

## 2019-10-04 MED ORDER — SODIUM CHLORIDE 0.9 % IV SOLN
Freq: Once | INTRAVENOUS | Status: AC
  Filled 2019-10-04: qty 250

## 2019-10-04 MED ORDER — FAMOTIDINE IN NACL 20-0.9 MG/50ML-% IV SOLN
INTRAVENOUS | Status: AC
  Filled 2019-10-04: qty 50

## 2019-10-04 MED ORDER — SODIUM CHLORIDE 0.9 % IV SOLN
188.8000 mg | Freq: Once | INTRAVENOUS | Status: AC
  Administered 2019-10-04: 190 mg via INTRAVENOUS
  Filled 2019-10-04: qty 19

## 2019-10-04 NOTE — Patient Instructions (Addendum)
[facility] Discharge Instructions for Patients Receiving Chemotherapy  Today you received the following chemotherapy agents Paclitaxel (TAXOL) and Carboplatin (Paraplatin).  To help prevent nausea and vomiting after your treatment, we encourage you to take your nausea medication as prescribed.   If you develop nausea and vomiting that is not controlled by your nausea medication, call the clinic.   BELOW ARE SYMPTOMS THAT SHOULD BE REPORTED IMMEDIATELY:  *FEVER GREATER THAN 100.5 F  *CHILLS WITH OR WITHOUT FEVER  NAUSEA AND VOMITING THAT IS NOT CONTROLLED WITH YOUR NAUSEA MEDICATION  *UNUSUAL SHORTNESS OF BREATH  *UNUSUAL BRUISING OR BLEEDING  TENDERNESS IN MOUTH AND THROAT WITH OR WITHOUT PRESENCE OF ULCERS  *URINARY PROBLEMS  *BOWEL PROBLEMS  UNUSUAL RASH Items with * indicate a potential emergency and should be followed up as soon as possible.  Feel free to call the clinic should you have any questions or concerns. The clinic phone number is [phone].  Please show the [place] at check-in to the Emergency Department and triage nurse.

## 2019-10-10 ENCOUNTER — Other Ambulatory Visit: Payer: Self-pay | Admitting: Oncology

## 2019-10-10 DIAGNOSIS — C50312 Malignant neoplasm of lower-inner quadrant of left female breast: Secondary | ICD-10-CM

## 2019-10-11 MED FILL — Dexamethasone Sodium Phosphate Inj 100 MG/10ML: INTRAMUSCULAR | Qty: 2 | Status: AC

## 2019-10-12 ENCOUNTER — Inpatient Hospital Stay: Payer: 59

## 2019-10-12 ENCOUNTER — Telehealth: Payer: Self-pay | Admitting: *Deleted

## 2019-10-12 ENCOUNTER — Encounter: Payer: Self-pay | Admitting: *Deleted

## 2019-10-12 ENCOUNTER — Inpatient Hospital Stay: Payer: 59 | Admitting: Adult Health

## 2019-10-12 NOTE — Progress Notes (Deleted)
[facility]  Telephone:(336) [phone] Fax:(336) [phone]     ID: Kathy Barker DOB: 1960/11/18  MR#: [id number]  HBL#:[id number]  Patient Care Team: Lupita Raider, MD as PCP - General (Family Medicine) Donnelly Angelica, RN as Oncology Nurse Navigator Pershing Proud, RN as Oncology Nurse Navigator Griselda Miner, MD as Consulting Physician (General Surgery) [name], Valentino Hue, MD as Consulting Physician (Oncology) Lonie Peak, MD as Attending Physician (Radiation Oncology) Janalyn Harder, MD as Consulting Physician (Dermatology) Noreene Filbert, NP OTHER MD:  CHIEF COMPLAINT: Left-sided breast cancer  CURRENT TREATMENT: Neoadjuvant chemotherapy   INTERVAL HISTORY: Kathy Barker returns today for follow up and treatment of her left-sided breast cancer.   She is here to receive her weekly neoadjuvant chemotherapy with Paclitaxel and Carboplatin.  Today is week # 4 of treatment.   REVIEW OF SYSTEMS: Kathy Barker is    HISTORY OF CURRENT ILLNESS: From the original intake note:  Kathy Barker herself noted a palpable lower-inner left breast lump and immediately brought it to medical attention.. She underwent bilateral diagnostic mammography with tomography and left breast ultrasonography at [facility] on 05/30/2019 showing: breast density category B; 1.7 cm lobulated mass in left breast at 8 o'clock; no significant left axillary abnormalities.  Accordingly on 06/01/2019 she proceeded to biopsy of the left breast area in question. The pathology from this procedure ([id number]) showed: invasive ductal carcinoma, grade 3. Prognostic indicators significant for: estrogen receptor, 70% positive with weak staining intensity and progesterone receptor, 0% negative. Proliferation marker Ki67 at 80%. HER2 negative by immunohistochemistry (1+).  The patient's subsequent history is as detailed below.   PAST MEDICAL HISTORY: Past Medical History:  Diagnosis Date  . Allergic rhinitis   . Anxiety     "only during my divorce" (11/18/2012)  . Cancer (HCC)   . Depression    "only during my divorce" (11/18/2012)  . GERD (gastroesophageal reflux disease)   . High cholesterol   . Hot flashes   . IBS (irritable bowel syndrome)   . Insomnia   . Migraines    "lately more often; before SVT I'd get them ~ q 6 months" (11/18/2012)  . PONV (postoperative nausea and vomiting)   . 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 CESAREAN SECTION  1992  . COMBINED HYSTERECTOMY VAGINAL / OOPHORECTOMY / A&P REPAIR  1999  . IR IMAGING GUIDED PORT INSERTION  06/30/2019  . SUPRAVENTRICULAR TACHYCARDIA ABLATION  11-18-2012   slow pathway modification of AVNRT by Dr Johney Frame  . SUPRAVENTRICULAR TACHYCARDIA ABLATION N/A 11/18/2012   Procedure: SUPRAVENTRICULAR TACHYCARDIA ABLATION;  Surgeon: Gardiner Rhyme, MD;  Location: [facility] CATH LAB;  Service: Cardiovascular;  Laterality: N/A;  . VAGINAL HYSTERECTOMY  ~ 2000    FAMILY HISTORY: Family History  Problem Relation Age of Onset  . Heart attack Father   . Alcohol abuse Father   . Heart attack Paternal Grandfather   . Heart Problems Other        both sides of family  . Breast cancer Mother    Her father died at age 66 from alcohol abuse. Her mother is living at age 92 (as of 05/2019) and has a history of DCIS at age 44 and invasive lobular breast cancer at age 31. Kathy Barker has one brother. She reports cancer of an unknown type in her maternal grandmother and prostate cancer in  a maternal uncle.   GYNECOLOGIC HISTORY:  No LMP recorded. Patient has had a hysterectomy. Menarche: 59 years old Age at first live birth: 59 years old [place] P 2 LMP 2000 Contraceptive: never used HRT never used  Hysterectomy? yes BSO? 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  She will be on temporary disability during her chemotherapy.  She is divorced. She lives at home with son Kathy Barker, age 32, who works in Biomedical scientist in [place]. Son Kathy Barker, age 4, works in Sales executive here in [place]. 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     ADVANCED DIRECTIVES: Not in place. She intends to name both of her sons as her 31.   HEALTH MAINTENANCE: Social History   Tobacco Use  . Smoking status: Never Smoker  . Smokeless tobacco: Never Used  Vaping Use  . Vaping Use: Never used  Substance Use Topics  . Alcohol use: Yes    Comment: 11/18/2012 "shot of brandy couple times/month"  . Drug use: No     Colonoscopy: yes, date unsure  PAP: date unsure  Bone density: never done   Allergies  Allergen Reactions  . Fentanyl Nausea And Vomiting    Per patient " extreme nausea and vomiting "   . Other     Problems with stitches  . Versed [Midazolam] Nausea And Vomiting    Per patient " extreme N/V"   . Latex Rash    Current Outpatient Medications  Medication Sig Dispense Refill  . acyclovir (ZOVIRAX) 400 MG tablet Take 2 tablets tid for 10 days then decrease to 1 tablet twice a day 120 tablet 3  . ciprofloxacin (CIPRO) 500 MG tablet Take 1 tablet (500 mg total) by mouth 2 (two) times daily. 10 tablet 0  . dexamethasone (DECADRON) 4 MG tablet TAKE TWO TABLETS BY MOUTH TWICE A DAY STARTING ON THE DAY AFTER CHEMOTHERAPY (DAY 2). REPEAT THE NEXT DAY (DAY 3). THEN TAKE TWO TABLETS IN THE MORNING ONLY DAY 4. THEN STOP. TAKE WITH FOOD. 30 tablet 1  . lidocaine-prilocaine (EMLA) cream Apply to affected area once 30 g 3  . LIVALO 4 MG TABS Take 1 tablet by mouth daily.    [name] loratadine (CLARITIN) 10 MG tablet Take 1 tablet (10 mg total) by mouth daily. 60 tablet 3  . LORazepam (ATIVAN) 0.5 MG tablet Use 1 tab daily as needed for anxiety and or nausea. 30 tablet 0  . magic mouthwash w/lidocaine SOLN Take 5 mLs by mouth 4 (four) times daily as needed for mouth  pain. 240 mL 1  . omeprazole (PRILOSEC) 40 MG capsule Take one capsule daily for 5 days starting on chemo day, then daily as needed 60 capsule 3  . prochlorperazine (COMPAZINE) 10 MG tablet Take 1 tablet (10 mg total) by mouth every 6 (six) hours as needed (Nausea or vomiting). 30 tablet 1  . sertraline (ZOLOFT) 100 MG tablet Take 100 mg by mouth daily.    . SYMBICORT 160-4.5 MCG/ACT inhaler Inhale 2 puffs into the lungs 2 (two) times daily.    . temazepam (RESTORIL) 15 MG capsule Take 15 mg by mouth at bedtime.     No current facility-administered medications for this visit.    OBJECTIVE: There were no vitals filed for this visit.   There is no height or weight on file to calculate BMI.   Wt Readings from Last 3 Encounters:  10/04/19 136 lb (  61.7 kg)  09/27/19 137 lb 9.6 oz (62.4 kg)  09/13/19 134 lb 1.6 oz (60.8 kg)      ECOG FS:1 - Symptomatic but completely ambulatory GENERAL: Patient is a well appearing female in no acute distress HEENT:  Sclerae anicteric.  Oropharynx clear and moist. No ulcerations or evidence of oropharyngeal candidiasis. Neck is supple.  NODES:  No cervical, supraclavicular, or axillary lymphadenopathy palpated.  BREAST EXAM:  Deferred. LUNGS:  Clear to auscultation bilaterally.  No wheezes or rhonchi. HEART:  Regular rate and rhythm. No murmur appreciated. ABDOMEN:  Soft, nontender.  Positive, normoactive bowel sounds. No organomegaly palpated. MSK:  No focal spinal tenderness to palpation. Full range of motion bilaterally in the upper extremities. EXTREMITIES:  No peripheral edema.   SKIN:  Clear with no obvious rashes or skin changes. No nail dyscrasia. NEURO:  Nonfocal. Well oriented.  Appropriate affect.     LAB RESULTS:  CMP     Component Value Date/Time   NA 139 10/04/2019 1241   K 3.9 10/04/2019 1241   CL 107 10/04/2019 1241   CO2 26 10/04/2019 1241   GLUCOSE 89 10/04/2019 1241   BUN 12 10/04/2019 1241   CREATININE 0.71 10/04/2019 1241    CALCIUM 9.5 10/04/2019 1241   PROT 6.7 10/04/2019 1241   ALBUMIN 3.9 10/04/2019 1241   AST 21 10/04/2019 1241   ALT 38 10/04/2019 1241   ALKPHOS 121 10/04/2019 1241   BILITOT 0.3 10/04/2019 1241   GFRNONAA >60 10/04/2019 1241   GFRAA >60 10/04/2019 1241    No results found for: TOTALPROTELP, ALBUMINELP, A1GS, A2GS, BETS, BETA2SER, GAMS, MSPIKE, SPEI  Lab Results  Component Value Date   WBC 7.5 10/04/2019   NEUTROABS 5.5 10/04/2019   HGB 11.6 (L) 10/04/2019   HCT 34.6 (L) 10/04/2019   MCV 98.6 10/04/2019   PLT 163 10/04/2019    No results found for: LABCA2  No components found for: URKYHC623  No results for input(s): INR in the last 168 hours.  No results found for: LABCA2  No results found for: JSE831  No results found for: DVV616  No results found for: WVP710  No results found for: CA2729  No components found for: HGQUANT  No results found for: CEA1 / No results found for: CEA1   No results found for: AFPTUMOR  No results found for: CHROMOGRNA  No results found for: KPAFRELGTCHN, LAMBDASER, KAPLAMBRATIO (kappa/lambda light chains)  No results found for: HGBA, HGBA2QUANT, HGBFQUANT, HGBSQUAN (Hemoglobinopathy evaluation)   No results found for: LDH  No results found for: IRON, TIBC, IRONPCTSAT (Iron and TIBC)  No results found for: FERRITIN  Urinalysis No results found for: COLORURINE, APPEARANCEUR, LABSPEC, PHURINE, GLUCOSEU, HGBUR, BILIRUBINUR, KETONESUR, PROTEINUR, UROBILINOGEN, NITRITE, LEUKOCYTESUR   STUDIES: No results found.   ELIGIBLE FOR AVAILABLE RESEARCH PROTOCOL: no  ASSESSMENT: 59 y.o. [place] woman status post left breast lower inner quadrant biopsy 06/01/2019 for a clinical T1c N0, stage IB invasive ductal carcinoma, grade 3, weakly estrogen receptor positive but functionally triple negative, with an MIB-1 of 80%.  (1) Oncotype score of 51 predicts a risk of recurrence outside the breast in the next 9 years of greater than 39%  if the patient's only systemic therapy is antiestrogens for 5 years.  It also predicts a greater than 15% benefit from chemotherapy  (2) neoadjuvant chemotherapy consisting of doxorubicin and cyclophosphamide in dose dense fashion x4 started 06/30/2019, completed 08/23/2019, followed by weekly paclitaxel and carboplatin weekly x12 starting 09/13/2019  (3) definitive  surgery pending  (4) adjuvant radiation to follow: Consider capecitabine sensitization   PLAN: Kristl   Total encounter time 35 minutes.Wilber Bihari, NP 10/12/19 8:12 AM Medical Oncology and Hematology [facility] [place], [place] [address] Tel. [phone]    Fax. [phone]   *Total Encounter Time as defined by the Centers for Medicare and Medicaid Services includes, in addition to the face-to-face time of a patient visit (documented in the note above) non-face-to-face time: obtaining and reviewing outside history, ordering and reviewing medications, tests or procedures, care coordination (communications with other health care professionals or caregivers) and documentation in the medical record.

## 2019-10-12 NOTE — Telephone Encounter (Signed)
Patient had already canceled appointments for today with scheduling when I called her around 9 am.  She knows to call me if her son tests positive, or if she develops any symptoms.  She will return next week for her treatment, and I have asked that our schedulers get her a provider appointment prior to her chemotherapy.  Wilber Bihari, NP

## 2019-10-12 NOTE — Telephone Encounter (Signed)
VM left by pt stating her son has been exposed to + Covid Art therapist- and she has been in his presence.  Son is proceeding to testing now.  She is inquiring what to do about her appt today per above.  " I don't feel great but don't think it is the Covid "  This RN returned call to pt and obtained her identified VM- message left informing her this office will see her as long as she is afebrile, no new cough, and able to smell.  Return call information left if needed.

## 2019-10-17 ENCOUNTER — Encounter: Payer: Self-pay | Admitting: Adult Health

## 2019-10-18 MED FILL — Dexamethasone Sodium Phosphate Inj 100 MG/10ML: INTRAMUSCULAR | Qty: 2 | Status: AC

## 2019-10-18 NOTE — Progress Notes (Signed)
[facility]   Telephone:([phone] Fax:(336) [phone]   Clinic Follow up Note   Patient Care Team: [name], MD as PCP - General (Family Medicine) Rockwell Germany, RN as Oncology Nurse Navigator Mauro Kaufmann, RN as Oncology Nurse Navigator [name], MD as Consulting Physician (General Surgery) [name], [name], MD as Consulting Physician (Oncology) [name], MD as Attending Physician (Radiation Oncology) Lavonna Monarch, MD as Consulting Physician (Dermatology) 10/19/2019  CHIEF COMPLAINT: Follow-up ER positive left breast cancer  CURRENT THERAPY: Neoadjuvant AC followed by weekly Taxol/carboplatin  INTERVAL HISTORY: Ms. Lourenco returns for follow-up and treatment as scheduled.  She received cycle 3 carbo/Taxol on 9/21.  She canceled 9/28 chemo due to her son's exposure to positive Covid case and being in his presence.  He tested negative, they never developed symptoms.  She returns today for treatment.  She feels well, appetite is good.  She is mildly fatigued but remains out of bed and active.  She has mouth sores "off and on" does not limit p.o. intake.  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  She does not drop things or have functional difficulties.  He does cryotherapy during chemo.  Denies rash, nausea, vomiting, constipation, diarrhea, fever, chills, cough, chest pain, dyspnea.  She does not palpate the left breast mass.   MEDICAL HISTORY:  Past Medical History:  Diagnosis Date  . Allergic rhinitis   . Anxiety    "only during my divorce" (11/18/2012)  . Cancer ([place])   . Depression    "only during my divorce" (11/18/2012)  . GERD (gastroesophageal reflux disease)   . High cholesterol   . Hot flashes   . IBS (irritable bowel syndrome)   . Insomnia   . Migraines    "lately more often; before SVT I'd get them ~ q 6 months" (11/18/2012)  . PONV (postoperative nausea and  vomiting)   . 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 CESAREAN SECTION  1992  . COMBINED HYSTERECTOMY VAGINAL / OOPHORECTOMY / A&P REPAIR  1999  . IR IMAGING GUIDED PORT INSERTION  06/30/2019  . 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 SUPRAVENTRICULAR TACHYCARDIA ABLATION N/A 11/18/2012   Procedure: SUPRAVENTRICULAR TACHYCARDIA ABLATION;  Surgeon: Coralyn Mark, MD;  Location: [facility] CATH [facility];  Service: Cardiovascular;  Laterality: N/A;  . VAGINAL HYSTERECTOMY  ~ 2000    I have reviewed the social history and family history with the patient and they are unchanged from previous note.  ALLERGIES:  is allergic to fentanyl, other, versed [midazolam], and latex.  MEDICATIONS:  Current Outpatient Medications  Medication Sig Dispense Refill  . acyclovir (ZOVIRAX) 400 MG tablet Take 2 tablets tid for 10 days then decrease to 1 tablet twice a day 120 tablet 3  . dexamethasone (DECADRON) 4 MG tablet TAKE TWO TABLETS BY MOUTH TWICE A DAY STARTING ON THE DAY AFTER CHEMOTHERAPY (DAY 2). REPEAT THE NEXT DAY (DAY 3). THEN TAKE TWO TABLETS IN THE MORNING ONLY DAY 4. THEN STOP. TAKE WITH FOOD. 30 tablet 1  . lidocaine-prilocaine (EMLA) cream Apply to affected area once 30 g 3  . loratadine (CLARITIN) 10 MG tablet Take 1 tablet (10 mg total) by mouth daily. 60 tablet 3  . LORazepam (ATIVAN) 0.5  MG tablet Use 1 tab daily as needed for anxiety and or nausea. 30 tablet 0  . magic mouthwash w/lidocaine SOLN Take 5 mLs by mouth 4 (four) times daily as needed for mouth pain. 240 mL 1  . omeprazole (PRILOSEC) 40 MG capsule Take one capsule daily for 5 days starting on chemo day, then daily as needed 60 capsule 3  . prochlorperazine (COMPAZINE) 10 MG tablet Take 1 tablet (10 mg total) by mouth every 6 (six) hours as  needed (Nausea or vomiting). 30 tablet 1  . sertraline (ZOLOFT) 100 MG tablet Take 100 mg by mouth daily.    . temazepam (RESTORIL) 15 MG capsule Take 15 mg by mouth at bedtime.    . ciprofloxacin (CIPRO) 500 MG tablet Take 1 tablet (500 mg total) by mouth 2 (two) times daily. 10 tablet 0  . HYDROMET 5-1.5 MG/5ML syrup SMARTSIG:2 Milliliter(s) By Mouth 10 Times Daily PRN    . LIVALO 4 MG TABS Take 1 tablet by mouth daily.    . SYMBICORT 160-4.5 MCG/ACT inhaler Inhale 2 puffs into the lungs 2 (two) times daily.     No current facility-administered medications for this visit.    PHYSICAL EXAMINATION: ECOG PERFORMANCE STATUS: 0 - Asymptomatic  Vitals:   10/19/19 0828  BP: (!) 148/83  Pulse: 100  Resp: 16  Temp: (!) 97.4 F (36.3 C)  SpO2: 100%   Filed Weights   10/19/19 0828  Weight: 139 lb 3.2 oz (63.1 kg)    GENERAL:alert, no distress and comfortable SKIN: No rash to exposed skin EYES: sclera clear OROPHARYNX: No thrush or ulcers NECK: Without mass LYMPH:  no palpable cervical or supraclavicular lymphadenopathy LUNGS: clear with normal breathing effort HEART: regular rate & rhythm, no lower extremity edema NEURO: alert & oriented x 3 with fluent speech, no focal sensory deficits over the fingertips per tuning fork exam Breast exam: No palpable mass in the biopsy-proven area of malignancy in the lower inner quadrant of left breast.  Right breast benign PAC without erythema  LABORATORY DATA:  I have reviewed the data as listed CBC Latest Ref Rng & Units 10/19/2019 10/04/2019 09/27/2019  WBC 4.0 - 10.5 K/uL 4.3 7.5 5.0  Hemoglobin 12.0 - 15.0 g/dL 11.6(L) 11.6(L) 11.0(L)  Hematocrit 36 - 46 % 35.2(L) 34.6(L) 33.9(L)  Platelets 150 - 400 K/uL 153 163 147(L)     CMP Latest Ref Rng & Units 10/19/2019 10/04/2019 09/27/2019  Glucose 70 - 99 mg/dL 112(H) 89 118(H)  BUN 6 - 20 mg/dL 8 12 10   Creatinine 0.44 - 1.00 mg/dL 0.72 0.71 0.69  Sodium 135 - 145 mmol/L 141 139 143    Potassium 3.5 - 5.1 mmol/L 4.1 3.9 4.1  Chloride 98 - 111 mmol/L 108 107 108  CO2 22 - 32 mmol/L 26 26 24   Calcium 8.9 - 10.3 mg/dL 9.3 9.5 9.0  Total Protein 6.5 - 8.1 g/dL 6.3(L) 6.7 6.3(L)  Total Bilirubin 0.3 - 1.2 mg/dL 0.3 0.3 0.3  Alkaline Phos 38 - 126 U/L 117 121 116  AST 15 - 41 U/L 20 21 25   ALT 0 - 44 U/L 33 38 37      RADIOGRAPHIC STUDIES: I have personally reviewed the radiological images as listed and agreed with the findings in the report. No results found.   ASSESSMENT & PLAN: 59 year old postmenopausal female  1.  Malignant neoplasm in the lower inner quadrant of the left breast  -Diagnosed 06/01/2019 for a clinical T1c N0, stage IB invasive  ductal carcinoma, grade 3, weakly estrogen receptor positive but functionally triple negative, with an MIB-1 of 80%. -Oncotype on the initial biopsy sample score of 51 predicts a risk of recurrence outside the breast in the next 9 years of greater than 39% if the patient's only systemic therapy is antiestrogens for 5 years.  It also predicts a greater than 15% benefit from chemotherapy -She began neoadjuvant chemotherapy ddAC x4 on 06/30/19 - 08/23/2019, followed by weekly paclitaxel and carboplatin weekly x12 starting 09/13/2019 -The treatment plan includes definitive surgery and adjuvant radiation to follow with the consideration of adding sensitizing capecitabine  Disposition: Ms. Cygan appears stable.  She is s/p 4 cycles of neoadjuvant AC and 3 cycles of carbo/Taxol.  She tolerates treatment very well overall with mild fatigue and mucositis.  Side effects are managed well with supportive meds at home. Her breast exam shows no palpable mass in the lower inner quadrant of the left breast, consistent with a great clinical response to neoadjuvant chemo. CBC and CMP are stable.  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  The recommendation is to hold chemo today  and follow-up next week with evaluation before resuming chemotherapy.  She is being scheduled for breast MRI in 2 weeks to help determine the course of therapy.  The plan was discussed with Dr. Jana Hakim.  Orders Placed This Encounter  Procedures  . MR BREAST BILATERAL W WO CONTRAST INC CAD    Standing Status:   Future    Standing Expiration Date:   10/18/2020    Order Specific Question:   If indicated for the ordered procedure, I authorize the administration of contrast media per Radiology protocol    Answer:   Yes    Order Specific Question:   What is the patient's sedation requirement?    Answer:   No Sedation    Order Specific Question:   Does the patient have a pacemaker or implanted devices?    Answer:   No    Order Specific Question:   Preferred imaging location?    Answer:   [facility] (table limit-550lbs)   All questions were answered. The patient knows to call the clinic with any problems, questions or concerns. No barriers to learning were detected.     Alla Feeling, NP 10/19/19

## 2019-10-19 ENCOUNTER — Encounter: Payer: Self-pay | Admitting: *Deleted

## 2019-10-19 ENCOUNTER — Other Ambulatory Visit: Payer: Self-pay

## 2019-10-19 ENCOUNTER — Inpatient Hospital Stay: Payer: 59

## 2019-10-19 ENCOUNTER — Inpatient Hospital Stay: Payer: 59 | Attending: Oncology | Admitting: Nurse Practitioner

## 2019-10-19 ENCOUNTER — Encounter: Payer: Self-pay | Admitting: Nurse Practitioner

## 2019-10-19 VITALS — BP 148/83 | HR 100 | Temp 97.4°F | Resp 16 | Ht 62.5 in | Wt 139.2 lb

## 2019-10-19 DIAGNOSIS — C50312 Malignant neoplasm of lower-inner quadrant of left female breast: Secondary | ICD-10-CM

## 2019-10-19 DIAGNOSIS — Z90721 Acquired absence of ovaries, unilateral: Secondary | ICD-10-CM | POA: Insufficient documentation

## 2019-10-19 DIAGNOSIS — R5383 Other fatigue: Secondary | ICD-10-CM | POA: Diagnosis not present

## 2019-10-19 DIAGNOSIS — Z803 Family history of malignant neoplasm of breast: Secondary | ICD-10-CM | POA: Insufficient documentation

## 2019-10-19 DIAGNOSIS — G620 Drug-induced polyneuropathy: Secondary | ICD-10-CM | POA: Insufficient documentation

## 2019-10-19 DIAGNOSIS — Z8249 Family history of ischemic heart disease and other diseases of the circulatory system: Secondary | ICD-10-CM | POA: Diagnosis not present

## 2019-10-19 DIAGNOSIS — Z885 Allergy status to narcotic agent status: Secondary | ICD-10-CM | POA: Diagnosis not present

## 2019-10-19 DIAGNOSIS — Z79899 Other long term (current) drug therapy: Secondary | ICD-10-CM | POA: Diagnosis not present

## 2019-10-19 DIAGNOSIS — K1230 Oral mucositis (ulcerative), unspecified: Secondary | ICD-10-CM | POA: Diagnosis not present

## 2019-10-19 DIAGNOSIS — T451X5A Adverse effect of antineoplastic and immunosuppressive drugs, initial encounter: Secondary | ICD-10-CM | POA: Insufficient documentation

## 2019-10-19 DIAGNOSIS — Z170 Estrogen receptor positive status [ER+]: Secondary | ICD-10-CM | POA: Insufficient documentation

## 2019-10-19 DIAGNOSIS — Z811 Family history of alcohol abuse and dependence: Secondary | ICD-10-CM | POA: Diagnosis not present

## 2019-10-19 DIAGNOSIS — Z95828 Presence of other vascular implants and grafts: Secondary | ICD-10-CM

## 2019-10-19 LAB — CMP (CANCER CENTER ONLY)
ALT: 33 U/L (ref 0–44)
AST: 20 U/L (ref 15–41)
Albumin: 3.7 g/dL (ref 3.5–5.0)
Alkaline Phosphatase: 117 U/L (ref 38–126)
Anion gap: 7 (ref 5–15)
BUN: 8 mg/dL (ref 6–20)
CO2: 26 mmol/L (ref 22–32)
Calcium: 9.3 mg/dL (ref 8.9–10.3)
Chloride: 108 mmol/L (ref 98–111)
Creatinine: 0.72 mg/dL (ref 0.44–1.00)
GFR, Estimated: >60 mL/min (ref >60)
Glucose, Bld: 112 mg/dL — ABNORMAL HIGH (ref 70–99)
Potassium: 4.1 mmol/L (ref 3.5–5.1)
Sodium: 141 mmol/L (ref 135–145)
Total Bilirubin: 0.3 mg/dL (ref 0.3–1.2)
Total Protein: 6.3 g/dL — ABNORMAL LOW (ref 6.5–8.1)

## 2019-10-19 LAB — CBC WITH DIFFERENTIAL (CANCER CENTER ONLY)
Abs Immature Granulocytes: 0.01 10*3/uL (ref 0.00–0.07)
Basophils Absolute: 0 10*3/uL (ref 0.0–0.1)
Basophils Relative: 1 %
Eosinophils Absolute: 0 10*3/uL (ref 0.0–0.5)
Eosinophils Relative: 1 %
HCT: 35.2 % — ABNORMAL LOW (ref 36.0–46.0)
Hemoglobin: 11.6 g/dL — ABNORMAL LOW (ref 12.0–15.0)
Immature Granulocytes: 0 %
Lymphocytes Relative: 25 %
Lymphs Abs: 1.1 10*3/uL (ref 0.7–4.0)
MCH: 32.7 pg (ref 26.0–34.0)
MCHC: 33 g/dL (ref 30.0–36.0)
MCV: 99.2 fL (ref 80.0–100.0)
Monocytes Absolute: 0.6 10*3/uL (ref 0.1–1.0)
Monocytes Relative: 14 %
Neutro Abs: 2.5 10*3/uL (ref 1.7–7.7)
Neutrophils Relative %: 59 %
Platelet Count: 153 10*3/uL (ref 150–400)
RBC: 3.55 MIL/uL — ABNORMAL LOW (ref 3.87–5.11)
RDW: 14.5 % (ref 11.5–15.5)
WBC Count: 4.3 10*3/uL (ref 4.0–10.5)
nRBC: 0 % (ref 0.0–0.2)

## 2019-10-19 MED ORDER — SODIUM CHLORIDE 0.9% FLUSH
10.0000 mL | Freq: Once | INTRAVENOUS | Status: AC
  Administered 2019-10-19: 10 mL
  Filled 2019-10-19: qty 10

## 2019-10-19 NOTE — Patient Instructions (Signed)

## 2019-10-20 ENCOUNTER — Telehealth: Payer: Self-pay | Admitting: Nurse Practitioner

## 2019-10-20 NOTE — Telephone Encounter (Signed)
Scheduled per 10/6 los. Unable to reach pt. Left voicemail for pt to give office a call back if pt is able to reschedule appts on 10/20 to later time to add in Follow up with GM.

## 2019-10-21 ENCOUNTER — Telehealth: Payer: Self-pay | Admitting: Nurse Practitioner

## 2019-10-21 NOTE — Telephone Encounter (Signed)
Scheduled per 10/6 los. Pt is aware of appt times and dates.

## 2019-10-24 ENCOUNTER — Ambulatory Visit
Admission: RE | Admit: 2019-10-24 | Discharge: 2019-10-24 | Disposition: A | Discharge status: Home / Self Care | Payer: 59 | Source: Ambulatory Visit | Attending: Nurse Practitioner | Admitting: Nurse Practitioner

## 2019-10-24 ENCOUNTER — Other Ambulatory Visit: Payer: Self-pay

## 2019-10-24 DIAGNOSIS — C50312 Malignant neoplasm of lower-inner quadrant of left female breast: Secondary | ICD-10-CM

## 2019-10-24 MED ORDER — GADOBUTROL 1 MMOL/ML IV SOLN
10.0000 mL | Freq: Once | INTRAVENOUS | Status: AC | PRN
  Administered 2019-10-24: 10 mL via INTRAVENOUS

## 2019-10-25 ENCOUNTER — Telehealth: Payer: Self-pay | Admitting: Oncology

## 2019-10-25 MED FILL — Dexamethasone Sodium Phosphate Inj 100 MG/10ML: INTRAMUSCULAR | Qty: 2 | Status: AC

## 2019-10-25 NOTE — Telephone Encounter (Signed)
Added MD f/u per sch msg. Called and left msg for patient with adjusted arrival time

## 2019-10-25 NOTE — Progress Notes (Signed)
[facility]  [phone] [phone]) [phone]     ID: Kathy Barker DOB: [birth year]  MR#: [id number]  IOX#:[id number]  Patient Care Team: Kathy Neer, MD as PCP - General (Family Medicine) Kathy Germany, RN as Oncology Nurse Navigator Kathy Kaufmann, RN as Oncology Nurse Navigator Kathy Kussmaul, MD as Consulting Physician (General Surgery) [place], Kathy Dad, MD as Consulting Physician (Oncology) Kathy Gibson, MD as Attending Physician (Radiation Oncology) Kathy Monarch, MD as Consulting Physician (Dermatology) Kathy Cruel, MD OTHER MD:  CHIEF COMPLAINT: Left-sided breast cancer  CURRENT TREATMENT: Neoadjuvant chemotherapy   INTERVAL HISTORY: Kathy Barker returns today for follow up and treatment of her left-sided breast cancer.   Since her last visit, she underwent breast MRI on 10/24/2019 showing: breast composition B; treatment response with known lower-inner left breast malignancy no longer visualized; no suspicious findings within either breast; no abnormal-appearing lymph nodes.  She began weekly paclitaxel and carboplatin on 09/13/2019.  She received 3 cycles, most recently on 10/04/2019, but cycle 4 on 10/12/2019 was postponed because her son apparently was being tested for Covid.  He did test negative.  She returned 10/19/2019 for treatment #4 but at that point was having intermittent neuropathy involving the fingertips.  She is here today for possible treatment or consideration of moving directly to surgery.   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  This is not disabling at present but it is persistent.  She also has pain over both hands which is probably not neuropathic.  She does not have any symptoms involving her feet.  Aside from this issues a detailed review of systems today was noncontributory   HISTORY OF CURRENT ILLNESS: From the original intake note:  Kathy Barker herself noted a  palpable lower-inner left breast lump and immediately brought it to medical attention.. She underwent bilateral diagnostic mammography with tomography and left breast ultrasonography at [facility] on 05/30/2019 showing: breast density category B; 1.7 cm lobulated mass in left breast at 8 o'clock; no significant left axillary abnormalities.  Accordingly on 06/01/2019 she proceeded to biopsy of the left breast area in question. The pathology from this procedure ([id number]) showed: invasive ductal carcinoma, grade 3. Prognostic indicators significant for: estrogen receptor, 70% positive with weak staining intensity and progesterone receptor, 0% negative. Proliferation marker Ki67 at 80%. HER2 negative by immunohistochemistry (1+).  The patient's subsequent history is as detailed below.   PAST MEDICAL HISTORY: Past Medical History:  Diagnosis Date  . Allergic rhinitis   . Anxiety    "only during my divorce" (11/18/2012)  . Cancer ([place])   . Depression    "only during my divorce" (11/18/2012)  . GERD (gastroesophageal reflux disease)   . High cholesterol   . Hot flashes   . IBS (irritable bowel syndrome)   . Insomnia   . Migraines    "lately more often; before SVT I'd get them ~ q 6 months" (11/18/2012)  . PONV (postoperative nausea and vomiting)   . 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 CESAREAN SECTION  1992  . COMBINED HYSTERECTOMY VAGINAL / OOPHORECTOMY / A&P REPAIR  1999  . IR IMAGING GUIDED PORT INSERTION  06/30/2019  . SUPRAVENTRICULAR TACHYCARDIA ABLATION  11-18-2012   slow pathway modification of AVNRT by Dr Kathy Barker  . SUPRAVENTRICULAR TACHYCARDIA ABLATION N/A 11/18/2012  Procedure: SUPRAVENTRICULAR TACHYCARDIA ABLATION;  Surgeon: Kathy Mark, MD;  Location: [facility];  Service: Cardiovascular;  Laterality: N/A;  . VAGINAL HYSTERECTOMY  ~  2000    FAMILY HISTORY: Family History  Problem Relation Age of Onset  . Heart attack Father   . Alcohol abuse Father   . Heart attack Paternal Grandfather   . Heart Problems Other        both sides of family  . Breast cancer Mother    Her father died at age 59 from alcohol abuse. Her mother is living at age 12 (as of 05/2019) and has a history of DCIS at age 25 and invasive lobular breast cancer at age 55. Kathy Barker has one brother. She reports cancer of an unknown type in her maternal grandmother and prostate cancer in a maternal uncle.   GYNECOLOGIC HISTORY:  No LMP recorded. Patient has had a hysterectomy. Menarche: 59 years old Age at first live birth: 59 years old Kathy Barker P 2 LMP 2000 Contraceptive: never used HRT never used  Hysterectomy? yes BSO? 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  She will be on temporary disability during her chemotherapy.  She is divorced. She lives at home with son Kathy [place], age 39, who works in Biomedical scientist in [place]. Son Kathy Barker, age 73, works in Sales executive here in [place]. 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     ADVANCED DIRECTIVES: Not in place. She intends to name both of her sons as her 19.   HEALTH MAINTENANCE: Social History   Tobacco Use  . Smoking status: Never Smoker  . Smokeless tobacco: Never Used  Vaping Use  . Vaping Use: Never used  Substance Use Topics  . Alcohol use: Yes    Comment: 11/18/2012 "shot of brandy couple times/month"  . Drug use: No     Colonoscopy: yes, date unsure  PAP: date unsure  Bone density: never done   Allergies  Allergen Reactions  . Fentanyl Nausea And Vomiting    Per patient " extreme nausea and vomiting "   . Other     Problems with stitches  . Versed [Midazolam] Nausea And Vomiting    Per patient " extreme N/V"   . Latex Rash    Current Outpatient Medications  Medication Sig Dispense Refill  .  acyclovir (ZOVIRAX) 400 MG tablet Take 2 tablets tid for 10 days then decrease to 1 tablet twice a day 120 tablet 3  . ciprofloxacin (CIPRO) 500 MG tablet Take 1 tablet (500 mg total) by mouth 2 (two) times daily. 10 tablet 0  . HYDROMET 5-1.5 MG/5ML syrup SMARTSIG:2 Milliliter(s) By Mouth 10 Times Daily PRN    . LIVALO 4 MG TABS Take 1 tablet by mouth daily.    [name] loratadine (CLARITIN) 10 MG tablet Take 1 tablet (10 mg total) by mouth daily. 60 tablet 3  . magic mouthwash w/lidocaine SOLN Take 5 mLs by mouth 4 (four) times daily as needed for mouth pain. 240 mL 1  . omeprazole (PRILOSEC) 40 MG capsule Take one capsule daily for 5 days starting on chemo day, then daily as needed 60 capsule 3  . sertraline (ZOLOFT) 100 MG tablet Take 100 mg by mouth daily.    . SYMBICORT 160-4.5 MCG/ACT inhaler Inhale 2 puffs into the lungs 2 (two) times daily.    . temazepam (RESTORIL) 15 MG capsule Take 15 mg by mouth at bedtime.  No current facility-administered medications for this visit.    OBJECTIVE: White woman in no acute distress Vitals:   10/26/19 0831  BP: (!) 148/65  Pulse: 100  Resp: 18  Temp: 97.6 F (36.4 C)  SpO2: 98%     Body mass index is 25.25 kg/m.   Wt Readings from Last 3 Encounters:  10/26/19 140 lb 4.8 oz (63.6 kg)  10/19/19 139 lb 3.2 oz (63.1 kg)  10/04/19 136 lb (61.7 kg)      ECOG FS:1 - Symptomatic but completely ambulatory  Sclerae unicteric, EOMs intact Wearing a mask No cervical or supraclavicular adenopathy Lungs no rales or rhonchi Heart regular rate and rhythm Abd soft, nontender, positive bowel sounds MSK no focal spinal tenderness, no upper extremity lymphedema Neuro: nonfocal, well oriented, appropriate affect Breasts: The right breast is unremarkable.  I do not palpate a mass in the left breast and there are no skin or nipple changes of concern.  Both axillae are benign.   LAB RESULTS:  CMP     Component Value Date/Time   NA 141 10/19/2019 0814     K 4.1 10/19/2019 0814   CL 108 10/19/2019 0814   CO2 26 10/19/2019 0814   GLUCOSE 112 (H) 10/19/2019 0814   BUN 8 10/19/2019 0814   CREATININE 0.72 10/19/2019 0814   CALCIUM 9.3 10/19/2019 0814   PROT 6.3 (L) 10/19/2019 0814   ALBUMIN 3.7 10/19/2019 0814   AST 20 10/19/2019 0814   ALT 33 10/19/2019 0814   ALKPHOS 117 10/19/2019 0814   BILITOT 0.3 10/19/2019 0814   GFRNONAA >60 10/19/2019 0814   GFRAA >60 10/04/2019 1241    No results found for: TOTALPROTELP, ALBUMINELP, A1GS, A2GS, BETS, BETA2SER, GAMS, MSPIKE, SPEI  Lab Results  Component Value Date   WBC 4.9 10/26/2019   NEUTROABS 3.3 10/26/2019   HGB 11.8 (L) 10/26/2019   HCT 35.3 (L) 10/26/2019   MCV 98.3 10/26/2019   PLT 165 10/26/2019    No results found for: LABCA2  No components found for: RWERXV400  No results for input(s): INR in the last 168 hours.  No results found for: LABCA2  No results found for: QQP619  No results found for: JKD326  No results found for: ZTI458  No results found for: CA2729  No components found for: HGQUANT  No results found for: CEA1 / No results found for: CEA1   No results found for: AFPTUMOR  No results found for: CHROMOGRNA  No results found for: KPAFRELGTCHN, LAMBDASER, KAPLAMBRATIO (kappa/lambda light chains)  No results found for: HGBA, HGBA2QUANT, HGBFQUANT, HGBSQUAN (Hemoglobinopathy evaluation)   No results found for: LDH  No results found for: IRON, TIBC, IRONPCTSAT (Iron and TIBC)  No results found for: FERRITIN  Urinalysis No results found for: COLORURINE, APPEARANCEUR, LABSPEC, PHURINE, GLUCOSEU, HGBUR, BILIRUBINUR, KETONESUR, PROTEINUR, UROBILINOGEN, NITRITE, LEUKOCYTESUR   STUDIES: MR BREAST BILATERAL W WO CONTRAST INC CAD  Result Date: 10/24/2019 CLINICAL DATA:  59 year old female with LEFT breast cancer diagnosed in June 2021, evaluate response to neoadjuvant treatment. Patient also had MR guided biopsies demonstrating complex sclerosing  lesion within the UPPER central LEFT breast and benign changes within the LOWER central LEFT breast. LABS:  None performed today EXAM: BILATERAL BREAST MRI WITH AND WITHOUT CONTRAST TECHNIQUE: Multiplanar, multisequence MR images of both breasts were obtained prior to and following the intravenous administration of 10 ml of Gadavist Three-dimensional MR images were rendered by post-processing of the original MR data on an independent workstation. The three-dimensional MR  images were interpreted, and findings are reported in the following complete MRI report for this study. Three dimensional images were evaluated at the independent interpreting workstation using the DynaCAD thin client. COMPARISON:  06/29/2019 FINDINGS: Breast composition: b. Scattered fibroglandular tissue. Background parenchymal enhancement: Mild Right breast: No mass or abnormal enhancement. Scattered foci are again noted. Left breast: The known malignancy within the LOWER INNER LEFT breast is no longer visualized. Biopsy clip artifact in this area is again identified. Scattered foci and clumped non masslike enhancement are not significantly changed. Biopsy clip artifacts within the central LEFT breast noted. Lymph nodes: No abnormal appearing lymph nodes. Ancillary findings: Port-A-Cath within the UPPER MEDIAL RIGHT chest noted. IMPRESSION: 1. Treatment response with known LOWER INNER LEFT breast malignancy no longer visualized. 2. No suspicious findings within either breast. No abnormal appearing lymph nodes. RECOMMENDATION: Treatment plan BI-RADS CATEGORY  6: Known biopsy-proven malignancy. Electronically Signed   By: Margarette Canada M.D.   On: 10/24/2019 15:38     ELIGIBLE FOR AVAILABLE RESEARCH PROTOCOL: no  ASSESSMENT: 59 y.o. [place] woman status post left breast lower inner quadrant biopsy 06/01/2019 for a clinical T1c N0, stage IB invasive ductal carcinoma, grade 3, weakly estrogen receptor positive but functionally triple negative,  with an MIB-1 of 80%.  (1) Oncotype score of 51 predicts a risk of recurrence outside the breast in the next 9 years of greater than 39% if the patient's only systemic therapy is antiestrogens for 5 years.  It also predicts a greater than 15% benefit from chemotherapy  (2) neoadjuvant chemotherapy consisting of doxorubicin and cyclophosphamide in dose dense fashion x4 started 06/30/2019, completed 08/23/2019, followed by weekly paclitaxel and carboplatin weekly x12 starting 09/13/2019, discontinued after 10/04/2019 dose  (a) carboplatin/paclitaxel discontinued after 3 doses with neuropathy developing  (b) repeat breast MRI 10/24/2019 shows resolution of the known malignancy  (3) definitive surgery pending  (4) adjuvant radiation to follow: Consider capecitabine sensitization   PLAN: Majestic has had a very good response to her chemotherapy even though we are having to discontinue it early because of neuropathy.  Likely this is still not affecting her daily activities.  We reviewed the MRI results which are very encouraging.  I am hopeful she will have a complete pathologic response at the time of definitive surgery.  I have alerted her surgeon to let him know she is now Barker.  Just in case there is some residual disease, in which case we would use pembrolizumab, we are going to keep the port for now  Muntaha will see me in about a month by which time we should have the results of her definitive surgery  She knows to call for any other issue that may develop before then  Total encounter time 25 minutes.Sarajane Jews C. Abygale Karpf, MD 10/26/19 8:44 AM Medical Oncology and Hematology [facility] [place], [place] [address] Tel. [phone]    Fax. [phone]   I, Wilburn Mylar, am acting as scribe for Dr. Virgie Barker. Gilverto Dileonardo.  I, Lurline Del MD, have reviewed the above documentation for accuracy and completeness, and I agree with the above.    *Total  Encounter Time as defined by the Centers for Medicare and Medicaid Services includes, in addition to the face-to-face time of a patient visit (documented in the note above) non-face-to-face time: obtaining and reviewing outside history, ordering and reviewing medications, tests or procedures, care coordination (communications with other health care professionals or caregivers) and documentation in the medical  record.

## 2019-10-26 ENCOUNTER — Inpatient Hospital Stay (HOSPITAL_BASED_OUTPATIENT_CLINIC_OR_DEPARTMENT_OTHER): Payer: 59 | Admitting: Oncology

## 2019-10-26 ENCOUNTER — Other Ambulatory Visit: Payer: 59

## 2019-10-26 ENCOUNTER — Other Ambulatory Visit: Payer: Self-pay

## 2019-10-26 ENCOUNTER — Inpatient Hospital Stay: Payer: 59

## 2019-10-26 ENCOUNTER — Inpatient Hospital Stay: Payer: 59 | Admitting: Nurse Practitioner

## 2019-10-26 ENCOUNTER — Encounter: Payer: Self-pay | Admitting: *Deleted

## 2019-10-26 VITALS — BP 148/65 | HR 100 | Temp 97.6°F | Resp 18 | Ht 62.5 in | Wt 140.3 lb

## 2019-10-26 DIAGNOSIS — C50312 Malignant neoplasm of lower-inner quadrant of left female breast: Secondary | ICD-10-CM | POA: Diagnosis not present

## 2019-10-26 DIAGNOSIS — Z170 Estrogen receptor positive status [ER+]: Secondary | ICD-10-CM

## 2019-10-26 LAB — CMP (CANCER CENTER ONLY)
ALT: 37 U/L (ref 0–44)
AST: 23 U/L (ref 15–41)
Albumin: 3.8 g/dL (ref 3.5–5.0)
Alkaline Phosphatase: 123 U/L (ref 38–126)
Anion gap: 7 (ref 5–15)
BUN: 9 mg/dL (ref 6–20)
CO2: 26 mmol/L (ref 22–32)
Calcium: 9.3 mg/dL (ref 8.9–10.3)
Chloride: 108 mmol/L (ref 98–111)
Creatinine: 0.74 mg/dL (ref 0.44–1.00)
GFR, Estimated: >60 mL/min (ref >60)
Glucose, Bld: 119 mg/dL — ABNORMAL HIGH (ref 70–99)
Potassium: 3.9 mmol/L (ref 3.5–5.1)
Sodium: 141 mmol/L (ref 135–145)
Total Bilirubin: 0.3 mg/dL (ref 0.3–1.2)
Total Protein: 6.5 g/dL (ref 6.5–8.1)

## 2019-10-26 LAB — CBC WITH DIFFERENTIAL (CANCER CENTER ONLY)
Abs Immature Granulocytes: 0.01 10*3/uL (ref 0.00–0.07)
Basophils Absolute: 0 10*3/uL (ref 0.0–0.1)
Basophils Relative: 1 %
Eosinophils Absolute: 0 10*3/uL (ref 0.0–0.5)
Eosinophils Relative: 1 %
HCT: 35.3 % — ABNORMAL LOW (ref 36.0–46.0)
Hemoglobin: 11.8 g/dL — ABNORMAL LOW (ref 12.0–15.0)
Immature Granulocytes: 0 %
Lymphocytes Relative: 22 %
Lymphs Abs: 1.1 10*3/uL (ref 0.7–4.0)
MCH: 32.9 pg (ref 26.0–34.0)
MCHC: 33.4 g/dL (ref 30.0–36.0)
MCV: 98.3 fL (ref 80.0–100.0)
Monocytes Absolute: 0.5 10*3/uL (ref 0.1–1.0)
Monocytes Relative: 9 %
Neutro Abs: 3.3 10*3/uL (ref 1.7–7.7)
Neutrophils Relative %: 67 %
Platelet Count: 165 10*3/uL (ref 150–400)
RBC: 3.59 MIL/uL — ABNORMAL LOW (ref 3.87–5.11)
RDW: 14.3 % (ref 11.5–15.5)
WBC Count: 4.9 10*3/uL (ref 4.0–10.5)
nRBC: 0 % (ref 0.0–0.2)

## 2019-10-28 ENCOUNTER — Telehealth: Payer: Self-pay | Admitting: Oncology

## 2019-10-28 ENCOUNTER — Ambulatory Visit: Payer: 59 | Admitting: Oncology

## 2019-10-28 NOTE — Telephone Encounter (Signed)
Scheduled per 10/13 los. Pt will receive an updated appt calendar, per next visit appt notes

## 2019-11-02 ENCOUNTER — Other Ambulatory Visit: Payer: Self-pay | Admitting: Oncology

## 2019-11-02 ENCOUNTER — Ambulatory Visit: Payer: 59 | Admitting: Nurse Practitioner

## 2019-11-02 ENCOUNTER — Telehealth: Payer: Self-pay

## 2019-11-02 ENCOUNTER — Other Ambulatory Visit: Payer: 59

## 2019-11-02 ENCOUNTER — Inpatient Hospital Stay: Payer: 59

## 2019-11-02 ENCOUNTER — Telehealth: Payer: Self-pay | Admitting: Oncology

## 2019-11-02 ENCOUNTER — Ambulatory Visit: Payer: 59

## 2019-11-02 ENCOUNTER — Inpatient Hospital Stay: Payer: 59 | Admitting: Oncology

## 2019-11-02 NOTE — Telephone Encounter (Signed)
Cancelled appointment per 10/20 scheduling message. Spoke to patient who is aware of appointments cancelled.

## 2019-11-02 NOTE — Telephone Encounter (Signed)
Attempted to call pt per Dr Jana Hakim, as pt does not need to be seen by GM today unless she wants to be seen. Pt is supposed to see Dr Jannifer Rodney after surgery. Pt did not answer, LVM for pt to return call before her appt.

## 2019-11-03 ENCOUNTER — Encounter: Payer: Self-pay | Admitting: *Deleted

## 2019-11-04 ENCOUNTER — Ambulatory Visit: Payer: Self-pay | Admitting: General Surgery

## 2019-11-04 DIAGNOSIS — C50312 Malignant neoplasm of lower-inner quadrant of left female breast: Secondary | ICD-10-CM

## 2019-11-04 DIAGNOSIS — Z170 Estrogen receptor positive status [ER+]: Secondary | ICD-10-CM

## 2019-11-09 ENCOUNTER — Inpatient Hospital Stay: Payer: 59

## 2019-11-09 ENCOUNTER — Inpatient Hospital Stay: Payer: 59 | Admitting: Adult Health

## 2019-11-11 ENCOUNTER — Encounter: Payer: Self-pay | Admitting: *Deleted

## 2019-11-11 DIAGNOSIS — C50312 Malignant neoplasm of lower-inner quadrant of left female breast: Secondary | ICD-10-CM

## 2019-11-14 ENCOUNTER — Telehealth: Payer: Self-pay | Admitting: *Deleted

## 2019-11-14 NOTE — Telephone Encounter (Signed)
LVM for call back to schedule appointment with Dr. Squire 

## 2019-11-16 ENCOUNTER — Other Ambulatory Visit: Payer: 59

## 2019-11-16 ENCOUNTER — Ambulatory Visit: Payer: 59

## 2019-11-24 NOTE — Progress Notes (Signed)
[facility]  [phone] Fax:([phone]     ID: Kathy Barker DOB: February 24, 1960  MR#: [id number]  QMV#:[id number]  Patient Care Team: [name], MD as PCP - General (Family Medicine) Rockwell Germany, RN as Oncology Nurse Navigator Mauro Kaufmann, RN as Oncology Nurse Navigator [name], MD as Consulting Physician (General Surgery) Jesse Nosbisch, [name], MD as Consulting Physician (Oncology) [name], MD as Attending Physician (Radiation Oncology) Lavonna Monarch, MD as Consulting Physician (Dermatology) Chauncey Cruel, MD OTHER MD:  CHIEF COMPLAINT: Left-sided breast cancer  CURRENT TREATMENT: Neoadjuvant chemotherapy   INTERVAL HISTORY: Kathy Barker returns today for follow up of her left-sided breast cancer.  Since her last visit with me she had her posttreatment breast MRI which showed an excellent response.  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  She is scheduled for left lumpectomy on 12/14/2019 under Dr. Marlou Starks.   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  This does not keep her from normal activities.  She does not have a balance problem since she has not fallen.  She has more problems in the left hand but that is where she had prior surgery.  She can make a fist but does not have good range of motion of the fingers otherwise.  Aside from these issues a detailed review of systems was stable.   COVID 19 VACCINATION STATUS: She has had both immunizations, most recently April 2021   HISTORY OF CURRENT ILLNESS: From the original intake note:  Kathy Barker herself noted a palpable lower-inner left breast lump and immediately brought it to medical attention.. She underwent bilateral diagnostic mammography with tomography and left breast ultrasonography at [facility] on 05/30/2019 showing: breast density category B; 1.7 cm lobulated  mass in left breast at 8 o'clock; no significant left axillary abnormalities.  Accordingly on 06/01/2019 she proceeded to biopsy of the left breast area in question. The pathology from this procedure ([id number]) showed: invasive ductal carcinoma, grade 3. Prognostic indicators significant for: estrogen receptor, 70% positive with weak staining intensity and progesterone receptor, 0% negative. Proliferation marker Ki67 at 80%. HER2 negative by immunohistochemistry (1+).  The patient's subsequent history is as detailed below.   PAST MEDICAL HISTORY: Past Medical History:  Diagnosis Date  . Allergic rhinitis   . Anxiety    "only during my divorce" (11/18/2012)  . Cancer ([facility])   . Depression    "only during my divorce" (11/18/2012)  . GERD (gastroesophageal reflux disease)   . High cholesterol   . Hot flashes   . IBS (irritable bowel syndrome)   . Insomnia   . Migraines    "lately more often; before SVT I'd get them ~ q 6 months" (11/18/2012)  . PONV (postoperative nausea and vomiting)   . 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 CESAREAN SECTION  1992  . COMBINED HYSTERECTOMY VAGINAL / OOPHORECTOMY / A&Barker REPAIR  1999  . IR IMAGING GUIDED PORT INSERTION  06/30/2019  . 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 SUPRAVENTRICULAR TACHYCARDIA ABLATION N/A 11/18/2012   Procedure: SUPRAVENTRICULAR TACHYCARDIA ABLATION;  Surgeon: Coralyn Mark, MD;  Location: [facility];  Service: Cardiovascular;  Laterality: N/A;  .  VAGINAL HYSTERECTOMY  ~ 2000    FAMILY HISTORY: Family History  Problem Relation Age of Onset  . Heart attack Father   . Alcohol abuse Father   . Heart attack Paternal Grandfather   . Heart Problems Other        both sides of family  . Breast cancer Mother    Her father  died at age 60 from alcohol abuse. Her mother is living at age 43 (as of 05/2019) and has a history of DCIS at age 34 and invasive lobular breast cancer at age 30. Kathy Barker has one brother. She reports cancer of an unknown type in her maternal grandmother and prostate cancer in a maternal uncle.   GYNECOLOGIC HISTORY:  No LMP recorded. Patient has had a hysterectomy. Menarche: 59 years old Age at first live birth: 59 years old Kathy Barker 2 LMP 2000 Contraceptive: never used HRT never used  Hysterectomy? yes BSO? 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  She will be on temporary disability during her chemotherapy.  She is divorced. She lives at home with son Kathy Barker, age 25, who works in Biomedical scientist in [place]. Son Kathy Barker, age 3, works in Sales executive here in [place]. 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     ADVANCED DIRECTIVES: Not in place. She intends to name both of her sons as her 32.   HEALTH MAINTENANCE: Social History   Tobacco Use  . Smoking status: Never Smoker  . Smokeless tobacco: Never Used  Vaping Use  . Vaping Use: Never used  Substance Use Topics  . Alcohol use: Yes    Comment: 11/18/2012 "shot of brandy couple times/month"  . Drug use: No     Colonoscopy: yes, date unsure  PAP: date unsure  Bone density: never done   Allergies  Allergen Reactions  . Fentanyl Nausea And Vomiting    Per patient " extreme nausea and vomiting "   . Other     Problems with stitches  . Versed [Midazolam] Nausea And Vomiting    Per patient " extreme N/V"   . Latex Rash    Current Outpatient Medications  Medication Sig Dispense Refill  . acyclovir (ZOVIRAX) 400 MG tablet Take 2 tablets tid for 10 days then decrease to 1 tablet twice a day 120 tablet 3  . ciprofloxacin (CIPRO) 500 MG tablet Take 1 tablet (500 mg total) by mouth 2 (two) times daily. 10 tablet 0  . HYDROMET 5-1.5 MG/5ML syrup  SMARTSIG:2 Milliliter(s) By Mouth 10 Times Daily PRN    . LIVALO 4 MG TABS Take 1 tablet by mouth daily.    [name] loratadine (CLARITIN) 10 MG tablet Take 1 tablet (10 mg total) by mouth daily. 60 tablet 3  . magic mouthwash w/lidocaine SOLN Take 5 mLs by mouth 4 (four) times daily as needed for mouth pain. 240 mL 1  . omeprazole (PRILOSEC) 40 MG capsule Take one capsule daily for 5 days starting on chemo day, then daily as needed 60 capsule 3  . sertraline (ZOLOFT) 100 MG tablet Take 100 mg by mouth daily.    . SYMBICORT 160-4.5 MCG/ACT inhaler Inhale 2 puffs into the lungs 2 (two) times daily.    . temazepam (RESTORIL) 15 MG capsule Take 15 mg by mouth at bedtime.     No current facility-administered medications for this visit.    OBJECTIVE: White woman in no acute distress Vitals:  11/25/19 0850  BP: 134/90  Pulse: 96  Resp: 18  Temp: (!) 97.1 F (36.2 C)  SpO2: 98%     Body mass index is 24.82 kg/m.   Wt Readings from Last 3 Encounters:  11/25/19 137 lb 14.4 oz (62.6 kg)  10/26/19 140 lb 4.8 oz (63.6 kg)  10/19/19 139 lb 3.2 oz (63.1 kg)      ECOG FS:1 - Symptomatic but completely ambulatory  Sclerae unicteric, EOMs intact Wearing a mask No cervical or supraclavicular adenopathy Lungs no rales or rhonchi Heart regular rate and rhythm Abd soft, nontender, positive bowel sounds MSK no focal spinal tenderness, no upper extremity lymphedema Neuro: nonfocal, well oriented, appropriate affect Breasts: The right breast is benign.  The left breast shows no palpable masses, no skin or nipple changes.  Both axillae are benign.   LAB RESULTS:  CMP     Component Value Date/Time   NA 141 10/26/2019 0822   K 3.9 10/26/2019 0822   CL 108 10/26/2019 0822   CO2 26 10/26/2019 0822   GLUCOSE 119 (H) 10/26/2019 0822   BUN 9 10/26/2019 0822   CREATININE 0.74 10/26/2019 0822   CALCIUM 9.3 10/26/2019 0822   PROT 6.5 10/26/2019 0822   ALBUMIN 3.8 10/26/2019 0822   AST 23 10/26/2019  0822   ALT 37 10/26/2019 0822   ALKPHOS 123 10/26/2019 0822   BILITOT 0.3 10/26/2019 0822   GFRNONAA >60 10/26/2019 0822   GFRAA >60 10/04/2019 1241    No results found for: TOTALPROTELP, ALBUMINELP, A1GS, A2GS, BETS, BETA2SER, GAMS, MSPIKE, SPEI  Lab Results  Component Value Date   WBC 4.9 10/26/2019   NEUTROABS 3.3 10/26/2019   HGB 11.8 (L) 10/26/2019   HCT 35.3 (L) 10/26/2019   MCV 98.3 10/26/2019   PLT 165 10/26/2019    No results found for: LABCA2  No components found for: HALPFX902  No results for input(s): INR in the last 168 hours.  No results found for: LABCA2  No results found for: IOX735  No results found for: HGD924  No results found for: QAS341  No results found for: CA2729  No components found for: HGQUANT  No results found for: CEA1 / No results found for: CEA1   No results found for: AFPTUMOR  No results found for: CHROMOGRNA  No results found for: KPAFRELGTCHN, LAMBDASER, KAPLAMBRATIO (kappa/lambda light chains)  No results found for: HGBA, HGBA2QUANT, HGBFQUANT, HGBSQUAN (Hemoglobinopathy evaluation)   No results found for: LDH  No results found for: IRON, TIBC, IRONPCTSAT (Iron and TIBC)  No results found for: FERRITIN  Urinalysis No results found for: COLORURINE, APPEARANCEUR, LABSPEC, PHURINE, GLUCOSEU, HGBUR, BILIRUBINUR, KETONESUR, PROTEINUR, UROBILINOGEN, NITRITE, LEUKOCYTESUR   STUDIES: No results found.   ELIGIBLE FOR AVAILABLE RESEARCH PROTOCOL: no  ASSESSMENT: 59 y.o. [place] woman status post left breast lower inner quadrant biopsy 06/01/2019 for a clinical T1c N0, stage IB invasive ductal carcinoma, grade 3, weakly estrogen receptor positive but functionally triple negative, with an MIB-1 of 80%.  (1) Oncotype score of 51 predicts a risk of recurrence outside the breast in the next 9 years of greater than 39% if the patient's only systemic therapy is antiestrogens for 5 years.  It also predicts a greater than 15%  benefit from chemotherapy  (2) neoadjuvant chemotherapy consisting of doxorubicin and cyclophosphamide in dose dense fashion x4 started 06/30/2019, completed 08/23/2019, followed by weekly paclitaxel and carboplatin weekly x12 starting 09/13/2019, discontinued after 10/04/2019 dose  (a) carboplatin/paclitaxel discontinued after 3 doses with neuropathy  developing  (b) repeat breast MRI 10/24/2019 shows resolution of the known malignancy  (3) definitive surgery pending  (4) adjuvant radiation to follow: Consider capecitabine sensitization   PLAN: Ilka has had the best possible radiologic response to her chemotherapy even though we were not able to complete it.  She is scheduled for surgery 12/14/2019.  I am expecting good news.  Despite that I think it is prudent to keep the port.  It is much easier to remove it later than to have to add it in case we get a surprise at the final surgery.  We discussed that today and she is agreeable.  That is also the plan through Dr. Marlou Starks.  She is already scheduled to start radiation 01/17/2019.  We discussed that as well today.  Accordingly barring any surprises from the definitive surgery she will see me again in February and we will pick up on follow-up at that time  Total encounter time 25 minutes.Sarajane Jews C. Philena Obey, MD 11/25/19 9:16 AM Medical Oncology and Hematology [facility] [place], [place] [address] Tel. [phone]    Fax. [phone]   I, Wilburn Mylar, am acting as scribe for Dr. [name]. Krystn Dermody.  I, Lurline Del MD, have reviewed the above documentation for accuracy and completeness, and I agree with the above.   *Total Encounter Time as defined by the Centers for Medicare and Medicaid Services includes, in addition to the face-to-face time of a patient visit (documented in the note above) non-face-to-face time: obtaining and reviewing outside history, ordering and reviewing medications,  tests or procedures, care coordination (communications with other health care professionals or caregivers) and documentation in the medical record.

## 2019-11-25 ENCOUNTER — Other Ambulatory Visit: Payer: Self-pay

## 2019-11-25 ENCOUNTER — Inpatient Hospital Stay: Payer: 59 | Attending: Oncology | Admitting: Oncology

## 2019-11-25 VITALS — BP 134/90 | HR 96 | Temp 97.1°F | Resp 18 | Ht 62.5 in | Wt 137.9 lb

## 2019-11-25 DIAGNOSIS — Z811 Family history of alcohol abuse and dependence: Secondary | ICD-10-CM | POA: Diagnosis not present

## 2019-11-25 DIAGNOSIS — Z79899 Other long term (current) drug therapy: Secondary | ICD-10-CM | POA: Diagnosis not present

## 2019-11-25 DIAGNOSIS — C50312 Malignant neoplasm of lower-inner quadrant of left female breast: Secondary | ICD-10-CM | POA: Diagnosis not present

## 2019-11-25 DIAGNOSIS — Z90721 Acquired absence of ovaries, unilateral: Secondary | ICD-10-CM | POA: Insufficient documentation

## 2019-11-25 DIAGNOSIS — Z803 Family history of malignant neoplasm of breast: Secondary | ICD-10-CM | POA: Insufficient documentation

## 2019-11-25 DIAGNOSIS — Z8249 Family history of ischemic heart disease and other diseases of the circulatory system: Secondary | ICD-10-CM | POA: Diagnosis not present

## 2019-11-25 DIAGNOSIS — Z885 Allergy status to narcotic agent status: Secondary | ICD-10-CM | POA: Diagnosis not present

## 2019-11-25 DIAGNOSIS — Z170 Estrogen receptor positive status [ER+]: Secondary | ICD-10-CM | POA: Diagnosis not present

## 2019-11-30 ENCOUNTER — Other Ambulatory Visit: Payer: Self-pay | Admitting: Oncology

## 2019-12-05 ENCOUNTER — Encounter (HOSPITAL_BASED_OUTPATIENT_CLINIC_OR_DEPARTMENT_OTHER): Payer: Self-pay | Admitting: General Surgery

## 2019-12-05 ENCOUNTER — Other Ambulatory Visit: Payer: Self-pay

## 2019-12-10 ENCOUNTER — Other Ambulatory Visit (HOSPITAL_COMMUNITY)
Admission: RE | Admit: 2019-12-10 | Discharge: 2019-12-10 | Disposition: A | Discharge status: Home / Self Care | Payer: 59 | Source: Ambulatory Visit | Attending: General Surgery | Admitting: General Surgery

## 2019-12-10 DIAGNOSIS — Z01818 Encounter for other preprocedural examination: Secondary | ICD-10-CM | POA: Diagnosis not present

## 2019-12-10 DIAGNOSIS — Z20822 Contact with and (suspected) exposure to covid-19: Secondary | ICD-10-CM | POA: Diagnosis not present

## 2019-12-11 LAB — SARS CORONAVIRUS 2 (TAT 6-24 HRS): SARS Coronavirus 2: NEGATIVE

## 2019-12-14 ENCOUNTER — Ambulatory Visit (HOSPITAL_BASED_OUTPATIENT_CLINIC_OR_DEPARTMENT_OTHER): Payer: 59 | Admitting: Certified Registered"

## 2019-12-14 ENCOUNTER — Ambulatory Visit (HOSPITAL_BASED_OUTPATIENT_CLINIC_OR_DEPARTMENT_OTHER)
Admission: RE | Admit: 2019-12-14 | Discharge: 2019-12-14 | Disposition: A | Discharge status: Home / Self Care | Payer: 59 | Attending: General Surgery | Admitting: General Surgery

## 2019-12-14 ENCOUNTER — Encounter (HOSPITAL_COMMUNITY)
Admission: RE | Admit: 2019-12-14 | Discharge: 2019-12-14 | Disposition: A | Discharge status: Home / Self Care | Payer: 59 | Source: Ambulatory Visit | Attending: General Surgery | Admitting: General Surgery

## 2019-12-14 ENCOUNTER — Other Ambulatory Visit: Payer: Self-pay

## 2019-12-14 ENCOUNTER — Encounter (HOSPITAL_BASED_OUTPATIENT_CLINIC_OR_DEPARTMENT_OTHER): Payer: Self-pay | Admitting: General Surgery

## 2019-12-14 ENCOUNTER — Encounter (HOSPITAL_BASED_OUTPATIENT_CLINIC_OR_DEPARTMENT_OTHER)
Admission: RE | Disposition: A | Discharge status: Home / Self Care | Payer: Self-pay | Source: Home / Self Care | Attending: General Surgery

## 2019-12-14 DIAGNOSIS — N6022 Fibroadenosis of left breast: Secondary | ICD-10-CM | POA: Insufficient documentation

## 2019-12-14 DIAGNOSIS — Z91040 Latex allergy status: Secondary | ICD-10-CM | POA: Insufficient documentation

## 2019-12-14 DIAGNOSIS — C50312 Malignant neoplasm of lower-inner quadrant of left female breast: Secondary | ICD-10-CM | POA: Diagnosis not present

## 2019-12-14 DIAGNOSIS — K219 Gastro-esophageal reflux disease without esophagitis: Secondary | ICD-10-CM | POA: Insufficient documentation

## 2019-12-14 DIAGNOSIS — Z79899 Other long term (current) drug therapy: Secondary | ICD-10-CM | POA: Diagnosis not present

## 2019-12-14 DIAGNOSIS — Z171 Estrogen receptor negative status [ER-]: Secondary | ICD-10-CM | POA: Diagnosis not present

## 2019-12-14 HISTORY — PX: BREAST LUMPECTOMY WITH RADIOACTIVE SEED AND SENTINEL LYMPH NODE BIOPSY: SHX6550

## 2019-12-14 SURGERY — BREAST LUMPECTOMY WITH RADIOACTIVE SEED AND SENTINEL LYMPH NODE BIOPSY
Anesthesia: General | Site: Breast | Laterality: Left

## 2019-12-14 MED ORDER — LACTATED RINGERS IV SOLN: INTRAVENOUS | Status: DC

## 2019-12-14 MED ORDER — FENTANYL CITRATE (PF) 100 MCG/2ML IJ SOLN
INTRAMUSCULAR | Status: AC
  Filled 2019-12-14: qty 2

## 2019-12-14 MED ORDER — MIDAZOLAM HCL 2 MG/2ML IJ SOLN
INTRAMUSCULAR | Status: AC
  Filled 2019-12-14: qty 2

## 2019-12-14 MED ORDER — ONDANSETRON HCL 4 MG/2ML IJ SOLN
INTRAMUSCULAR | Status: DC | PRN
  Administered 2019-12-14: 4 mg via INTRAVENOUS

## 2019-12-14 MED ORDER — ACETAMINOPHEN 500 MG PO TABS
ORAL_TABLET | ORAL | Status: AC
  Filled 2019-12-14: qty 2

## 2019-12-14 MED ORDER — BUPIVACAINE-EPINEPHRINE (PF) 0.5% -1:200000 IJ SOLN
INTRAMUSCULAR | Status: DC | PRN
  Administered 2019-12-14: 25 mL via PERINEURAL

## 2019-12-14 MED ORDER — SUFENTANIL CITRATE 50 MCG/ML IV SOLN
INTRAVENOUS | Status: AC
  Filled 2019-12-14: qty 1

## 2019-12-14 MED ORDER — GABAPENTIN 300 MG PO CAPS
ORAL_CAPSULE | ORAL | Status: AC
  Filled 2019-12-14: qty 1

## 2019-12-14 MED ORDER — HYDROCODONE-ACETAMINOPHEN 5-325 MG PO TABS: 1.0000 | ORAL_TABLET | Freq: Four times a day (QID) | ORAL | 0 refills | Status: DC | PRN

## 2019-12-14 MED ORDER — PHENYLEPHRINE 40 MCG/ML (10ML) SYRINGE FOR IV PUSH (FOR BLOOD PRESSURE SUPPORT)
PREFILLED_SYRINGE | INTRAVENOUS | Status: AC
  Filled 2019-12-14: qty 10

## 2019-12-14 MED ORDER — PROPOFOL 10 MG/ML IV BOLUS
INTRAVENOUS | Status: DC | PRN
  Administered 2019-12-14: 150 mg via INTRAVENOUS

## 2019-12-14 MED ORDER — CHLORHEXIDINE GLUCONATE CLOTH 2 % EX PADS: 6.0000 | MEDICATED_PAD | Freq: Once | CUTANEOUS | Status: DC

## 2019-12-14 MED ORDER — CEFAZOLIN SODIUM-DEXTROSE 2-4 GM/100ML-% IV SOLN
2.0000 g | INTRAVENOUS | Status: AC
  Administered 2019-12-14: 2 g via INTRAVENOUS

## 2019-12-14 MED ORDER — PROPOFOL 10 MG/ML IV BOLUS
INTRAVENOUS | Status: AC
  Filled 2019-12-14: qty 20

## 2019-12-14 MED ORDER — ONDANSETRON HCL 4 MG/2ML IJ SOLN: 4.0000 mg | Freq: Four times a day (QID) | INTRAMUSCULAR | Status: DC | PRN

## 2019-12-14 MED ORDER — CEFAZOLIN SODIUM-DEXTROSE 2-4 GM/100ML-% IV SOLN
INTRAVENOUS | Status: AC
  Filled 2019-12-14: qty 100

## 2019-12-14 MED ORDER — HYDROMORPHONE HCL 1 MG/ML IJ SOLN: 0.2500 mg | INTRAMUSCULAR | Status: DC | PRN

## 2019-12-14 MED ORDER — BUPIVACAINE HCL (PF) 0.25 % IJ SOLN
INTRAMUSCULAR | Status: DC | PRN
  Administered 2019-12-14: 21 mL

## 2019-12-14 MED ORDER — OXYCODONE HCL 5 MG/5ML PO SOLN: 5.0000 mg | Freq: Once | ORAL | Status: DC | PRN

## 2019-12-14 MED ORDER — PROPOFOL 500 MG/50ML IV EMUL
INTRAVENOUS | Status: AC
  Filled 2019-12-14: qty 50

## 2019-12-14 MED ORDER — GABAPENTIN 300 MG PO CAPS
300.0000 mg | ORAL_CAPSULE | ORAL | Status: AC
  Administered 2019-12-14: 300 mg via ORAL

## 2019-12-14 MED ORDER — CELECOXIB 200 MG PO CAPS
200.0000 mg | ORAL_CAPSULE | ORAL | Status: AC
  Administered 2019-12-14: 200 mg via ORAL

## 2019-12-14 MED ORDER — OXYCODONE HCL 5 MG PO TABS: 5.0000 mg | ORAL_TABLET | Freq: Once | ORAL | Status: DC | PRN

## 2019-12-14 MED ORDER — PROPOFOL 500 MG/50ML IV EMUL
INTRAVENOUS | Status: DC | PRN
  Administered 2019-12-14 (×2): 150 ug/kg/min via INTRAVENOUS

## 2019-12-14 MED ORDER — METHYLENE BLUE 0.5 % INJ SOLN
INTRAVENOUS | Status: AC
  Filled 2019-12-14: qty 10

## 2019-12-14 MED ORDER — CELECOXIB 200 MG PO CAPS
ORAL_CAPSULE | ORAL | Status: AC
  Filled 2019-12-14: qty 1

## 2019-12-14 MED ORDER — LIDOCAINE HCL (CARDIAC) PF 100 MG/5ML IV SOSY
PREFILLED_SYRINGE | INTRAVENOUS | Status: DC | PRN
  Administered 2019-12-14: 50 mg via INTRAVENOUS

## 2019-12-14 MED ORDER — ACETAMINOPHEN 500 MG PO TABS
1000.0000 mg | ORAL_TABLET | ORAL | Status: AC
  Administered 2019-12-14: 1000 mg via ORAL

## 2019-12-14 MED ORDER — DEXAMETHASONE SODIUM PHOSPHATE 10 MG/ML IJ SOLN
INTRAMUSCULAR | Status: DC | PRN
  Administered 2019-12-14: 10 mg via INTRAVENOUS

## 2019-12-14 MED ORDER — DEXAMETHASONE SODIUM PHOSPHATE 10 MG/ML IJ SOLN
INTRAMUSCULAR | Status: AC
  Filled 2019-12-14: qty 1

## 2019-12-14 MED ORDER — SUFENTANIL CITRATE 50 MCG/ML IV SOLN
INTRAVENOUS | Status: DC | PRN
Start: 2019-12-14 — End: 2019-12-14
  Administered 2019-12-14 (×2): 5 ug via INTRAVENOUS

## 2019-12-14 MED ORDER — ONDANSETRON HCL 4 MG/2ML IJ SOLN
INTRAMUSCULAR | Status: AC
  Filled 2019-12-14: qty 2

## 2019-12-14 MED ORDER — TECHNETIUM TC 99M TILMANOCEPT KIT
1.0000 | PACK | Freq: Once | INTRAVENOUS | Status: AC | PRN
  Administered 2019-12-14: 1 via INTRADERMAL

## 2019-12-14 MED ORDER — PHENYLEPHRINE HCL (PRESSORS) 10 MG/ML IV SOLN
INTRAVENOUS | Status: DC | PRN
  Administered 2019-12-14: 40 ug via INTRAVENOUS
  Administered 2019-12-14: 80 ug via INTRAVENOUS
  Administered 2019-12-14: 40 ug via INTRAVENOUS
  Administered 2019-12-14 (×2): 80 ug via INTRAVENOUS
  Administered 2019-12-14 (×2): 40 ug via INTRAVENOUS
  Administered 2019-12-14: 80 ug via INTRAVENOUS
  Administered 2019-12-14: 40 ug via INTRAVENOUS

## 2019-12-14 MED ORDER — LIDOCAINE 2% (20 MG/ML) 5 ML SYRINGE
INTRAMUSCULAR | Status: AC
  Filled 2019-12-14: qty 5

## 2019-12-14 SURGICAL SUPPLY — 46 items
ADH SKN CLS APL DERMABOND .7 (GAUZE/BANDAGES/DRESSINGS) ×1
APL PRP STRL LF DISP 70% ISPRP (MISCELLANEOUS) ×1
APPLIER CLIP 9.375 MED OPEN (MISCELLANEOUS) ×4
APR CLP MED 9.3 20 MLT OPN (MISCELLANEOUS) ×2
BINDER BREAST LRG (GAUZE/BANDAGES/DRESSINGS) ×2 IMPLANT
BLADE SURG 15 STRL LF DISP TIS (BLADE) ×1 IMPLANT
BLADE SURG 15 STRL SS (BLADE) ×2
CANISTER SUC SOCK COL 7IN (MISCELLANEOUS) IMPLANT
CANISTER SUCT 1200ML W/VALVE (MISCELLANEOUS) ×2 IMPLANT
CHLORAPREP W/TINT 26 (MISCELLANEOUS) ×2 IMPLANT
CLIP APPLIE 9.375 MED OPEN (MISCELLANEOUS) ×2 IMPLANT
COVER BACK TABLE 60X90IN (DRAPES) ×2 IMPLANT
COVER MAYO STAND STRL (DRAPES) ×2 IMPLANT
COVER PROBE W GEL 5X96 (DRAPES) ×2 IMPLANT
COVER WAND RF STERILE (DRAPES) IMPLANT
DECANTER SPIKE VIAL GLASS SM (MISCELLANEOUS) ×2 IMPLANT
DERMABOND ADVANCED (GAUZE/BANDAGES/DRESSINGS) ×1
DERMABOND ADVANCED .7 DNX12 (GAUZE/BANDAGES/DRESSINGS) ×1 IMPLANT
DRAPE LAPAROSCOPIC ABDOMINAL (DRAPES) ×2 IMPLANT
DRAPE UTILITY XL STRL (DRAPES) ×2 IMPLANT
ELECT COATED BLADE 2.86 ST (ELECTRODE) ×2 IMPLANT
ELECT REM PT RETURN 9FT ADLT (ELECTROSURGICAL) ×2
ELECTRODE REM PT RTRN 9FT ADLT (ELECTROSURGICAL) ×1 IMPLANT
GLOVE BIO SURGEON STRL SZ7.5 (GLOVE) ×4 IMPLANT
GOWN STRL REUS W/ TWL LRG LVL3 (GOWN DISPOSABLE) ×3 IMPLANT
GOWN STRL REUS W/TWL LRG LVL3 (GOWN DISPOSABLE) ×6
ILLUMINATOR WAVEGUIDE N/F (MISCELLANEOUS) IMPLANT
KIT MARKER MARGIN INK (KITS) ×2 IMPLANT
LIGHT WAVEGUIDE WIDE FLAT (MISCELLANEOUS) IMPLANT
NDL SAFETY ECLIPSE 18X1.5 (NEEDLE) IMPLANT
NEEDLE HYPO 18GX1.5 SHARP (NEEDLE)
NEEDLE HYPO 25X1 1.5 SAFETY (NEEDLE) ×2 IMPLANT
NS IRRIG 1000ML POUR BTL (IV SOLUTION) IMPLANT
PACK BASIN DAY SURGERY FS (CUSTOM PROCEDURE TRAY) ×2 IMPLANT
PENCIL SMOKE EVACUATOR (MISCELLANEOUS) ×2 IMPLANT
SLEEVE SCD COMPRESS KNEE MED (MISCELLANEOUS) ×2 IMPLANT
SPONGE LAP 18X18 RF (DISPOSABLE) ×2 IMPLANT
SUT MNCRL AB 4-0 PS2 18 (SUTURE) ×4 IMPLANT
SUT MON AB 4-0 PC3 18 (SUTURE) ×4 IMPLANT
SUT SILK 2 0 SH (SUTURE) IMPLANT
SUT VICRYL 3-0 CR8 SH (SUTURE) ×2 IMPLANT
SYR CONTROL 10ML LL (SYRINGE) ×2 IMPLANT
TOWEL GREEN STERILE FF (TOWEL DISPOSABLE) ×2 IMPLANT
TRAY FAXITRON CT DISP (TRAY / TRAY PROCEDURE) ×2 IMPLANT
TUBE CONNECTING 20X1/4 (TUBING) ×2 IMPLANT
YANKAUER SUCT BULB TIP NO VENT (SUCTIONS) ×2 IMPLANT

## 2019-12-14 NOTE — Transfer of Care (Signed)
Immediate Anesthesia Transfer of Care Note  Patient: Kathy Barker  Procedure(s) Performed: LEFT BREAST LUMPECTOMY X 2 WITH RADIOACTIVE SEED AND SENTINEL LYMPH NODE BIOPSY (Left Breast)  Patient Location: PACU  Anesthesia Type:GA combined with regional for post-op pain  Level of Consciousness: awake, alert  and oriented  Airway & Oxygen Therapy: Patient Spontanous Breathing and Patient connected to face mask oxygen  Post-op Assessment: Report given to RN and Post -op Vital signs reviewed and stable  Post vital signs: Reviewed and stable  Last Vitals:  Vitals Value Taken Time  BP 106/65 12/14/19 1424  Temp    Pulse 96 12/14/19 1425  Resp 16 12/14/19 1425  SpO2 100 % 12/14/19 1425  Vitals shown include unvalidated device data.  Last Pain:  Vitals:   12/14/19 1050  TempSrc: Oral  PainSc: 0-No pain      Patients Stated Pain Goal: 6 (82/88/33 7445)  Complications: No complications documented.

## 2019-12-14 NOTE — Anesthesia Preprocedure Evaluation (Addendum)
Anesthesia Evaluation  Patient identified by MRN, date of birth, ID band Patient awake    Reviewed: Allergy & Precautions, H&P , NPO status , Patient's Chart, lab work & pertinent test results  History of Anesthesia Complications (+) PONV and history of anesthetic complications  Airway Mallampati: II   Neck ROM: full    Dental   Pulmonary neg pulmonary ROS,    breath sounds clear to auscultation       Cardiovascular  Rhythm:regular Rate:Normal  S/p ablation for SVT (2014)   Neuro/Psych  Headaches, PSYCHIATRIC DISORDERS Anxiety Depression    GI/Hepatic GERD  ,  Endo/Other    Renal/GU      Musculoskeletal   Abdominal   Peds  Hematology   Anesthesia Other Findings   Reproductive/Obstetrics                             Anesthesia Physical Anesthesia Plan  ASA: II  Anesthesia Plan: General   Post-op Pain Management:  Regional for Post-op pain   Induction: Intravenous  PONV Risk Score and Plan: 4 or greater and Ondansetron, Midazolam, Dexamethasone and Treatment may vary due to age or medical condition  Airway Management Planned: LMA  Additional Equipment:   Intra-op Plan:   Post-operative Plan: Extubation in OR  Informed Consent: I have reviewed the patients History and Physical, chart, labs and discussed the procedure including the risks, benefits and alternatives for the proposed anesthesia with the patient or authorized representative who has indicated his/her understanding and acceptance.       Plan Discussed with: CRNA, Anesthesiologist and Surgeon  Anesthesia Plan Comments:         Anesthesia Quick Evaluation

## 2019-12-14 NOTE — Progress Notes (Signed)
Patient picked up Ensure on the morning of surgery but will not be able to complete shower with surgical soap.

## 2019-12-14 NOTE — Interval H&P Note (Signed)
History and Physical Interval Note:  12/14/2019 12:03 PM  Kathy Barker  has presented today for surgery, with the diagnosis of LEFT BREAST CANCER AND COMPLEX SCLEROSING LESION.  The various methods of treatment have been discussed with the patient and family. After consideration of risks, benefits and other options for treatment, the patient has consented to  Procedure(s) with comments: LEFT BREAST LUMPECTOMY X 2 WITH RADIOACTIVE SEED AND SENTINEL LYMPH NODE BIOPSY (Left) - PEC BLOCK as a surgical intervention.  The patient's history has been reviewed, patient examined, no change in status, stable for surgery.  I have reviewed the patient's chart and labs.  Questions were answered to the patient's satisfaction.     Autumn Messing III

## 2019-12-14 NOTE — Op Note (Signed)
12/14/2019  2:20 PM  PATIENT:  Kathy Barker  59 y.o. female  PRE-OPERATIVE DIAGNOSIS:  LEFT BREAST CANCER AND COMPLEX SCLEROSING LESION  POST-OPERATIVE DIAGNOSIS:  LEFT BREAST CANCER AND COMPLEX SCLEROSING LESION  PROCEDURE:  Procedure(s) with comments: LEFT BREAST LUMPECTOMY X 2 WITH RADIOACTIVE SEED LOCALIZATION AND DEEP LEFT AXILLARY SENTINEL LYMPH NODE BIOPSY (Left) - PEC BLOCK  SURGEON:  Surgeon(s) and Role:    * [name], MD - Primary  PHYSICIAN ASSISTANT:   ASSISTANTS: Dr. Jamelle Rushing   ANESTHESIA:   local and general  EBL:  20 mL   BLOOD ADMINISTERED:none  DRAINS: none   LOCAL MEDICATIONS USED:  MARCAINE     SPECIMEN:  Source of Specimen:  left breast tissue x 2 and sentinel nodes x 3  DISPOSITION OF SPECIMEN:  PATHOLOGY  COUNTS:  YES  TOURNIQUET:  * No tourniquets in log *  DICTATION: .Dragon Dictation   After informed consent was obtained the patient was brought to the operating room and placed in the supine position on the operating table.  After adequate induction of general anesthesia the patient's left chest, breast, and axillary area were prepped with ChloraPrep, allowed to dry, and draped in usual sterile manner.  An appropriate timeout was performed.  Previously 2 I-125 seeds were placed in the left breast.  The lateral seed was marking the complex sclerosing lesion.  The medial seed was marking the invasive breast cancer.  Also earlier in the day the patient underwent injection of 1 mCi of technetium sulfur colloid in the subareolar position on the left.  The neoprobe was initially set to technetium and attention was focused on the left axilla.  The patient had a good radioactive signal in the left axilla.  The area overlying this was infiltrated with quarter percent Marcaine.  A small transversely oriented incision was made with a 15 blade knife overlying the area of radioactivity.  The incision was carried through the skin and subcutaneous tissue  sharply with the electrocautery until the deep left axillary space was entered.  A [place] retractor was deployed.  Blunt hemostat dissection was directed by the neoprobe.  I was able to identify 3 lymph nodes.  2 of these nodes had increased radioactivity and one was palpable.  All 3 nodes were removed sharply with the electrocautery and the surrounding small vessels and lymphatics were controlled with clips.  Ex vivo counts on the first 2 nodes ranged from 100 to 800.  The third node was palpable but had no radioactivity.  No other hot or palpable nodes were identified in the left axilla.  These nodes were sent to pathology as sentinel nodes numbers 1 through 3.  Hemostasis was achieved using the Bovie electrocautery.  The deep layer of the wound was then closed with interrupted 3-0 Vicryl stitches.  The skin was then closed with a running 4-0 Monocryl subcuticular stitch.  Attention was then turned to the left breast.  The neoprobe was set to I-125 and the 2 areas of radioactivity were identified.  The area around each seed was then infiltrated with quarter percent Marcaine.  Attention was first turned to the complex sclerosing lesion.  A small incision was made on the lateral edge of the areola of the left breast with a 15 blade knife.  The incision was carried through the skin and subcutaneous tissue sharply with the electrocautery.  Dissection was then carried towards the radioactive seed under the direction of the neoprobe.  Once I more closely  approached the radioactive seed I then removed a circular portion of breast tissue sharply with the electrocautery around the radioactive seed will check in the area of radioactivity frequently.  Once the specimen was removed it was oriented with the appropriate paint colors.  A specimen radiograph was obtained that showed the clip and seed to be within the specimen.  The specimen was then sent to pathology for further evaluation.  Hemostasis was achieved using the  Bovie electrocautery.  The wound was irrigated with saline and infiltrated with more quarter percent Marcaine.  The deep layer of the wound was then closed with layers of interrupted 3-0 Vicryl stitches.  The skin was closed with interrupted 4 Monocryl subcuticular stitches.  Attention was then turned to the cancer in the medial left breast.  A small incision was made along the medial edge of the areola of the left breast with a 15 blade knife.  The incision was carried through the skin and subcutaneous tissue sharply with the electrocautery.  Dissection was then carried out throughout the medial part of the breast between the breast tissue and the subcutaneous fat.  Once this dissection was well beyond the area of the radioactive seed I then removed a circular portion of breast tissue around the radioactive seed while checking the area of radioactivity frequently.  Once the specimen was removed it was oriented with the appropriate paint colors.  A specimen radiograph was obtained that showed the clip and seed to be near the center of the specimen.  The specimen was then sent to pathology for further evaluation.  Hemostasis was achieved using the Bovie electrocautery.  The wound was irrigated with saline and infiltrated with more quarter percent Marcaine.  The deep layer of the wound was then closed with layers of interrupted 3-0 Vicryl stitches.  The skin was then closed with interrupted 4 Monocryl subcuticular stitches.  Dermabond dressings were applied.  The patient tolerated the procedure well.  At the end of the case all needle sponge and instrument counts were correct.  The patient was then awakened and taken recovery in stable condition.  PLAN OF CARE: Discharge to home after PACU  PATIENT DISPOSITION:  PACU - hemodynamically stable.   Delay start of Pharmacological VTE agent (>24hrs) due to surgical blood loss or risk of bleeding: not applicable

## 2019-12-14 NOTE — Progress Notes (Signed)
Assisted Dr. Marcie Bal with left, ultrasound guided, pectoralis block. Side rails up, monitors on throughout procedure. See vital signs in flow sheet. Tolerated Procedure well. No meds given per patient request.

## 2019-12-14 NOTE — H&P (Signed)
Kathy Barker  Location: [facility] Patient #: [id number] DOB: April 03, 1960 Divorced / Language: Cleophus Molt / Race: White Female   History of Present Illness  The patient is a 59 year old female who presents for a follow-up for Breast cancer. 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 The cancer was essentially a triple negative with a Ki-67 of 80%. She was treated with neoadjuvant chemotherapy and has had a good response. Her recent MRI showed no residual evidence of enhancement and normal-looking lymph nodes. Her last dose of chemotherapy was about 3-4 weeks ago. She is now ready to schedule her definitive surgery.   Allergies  Latex Exam Gloves *MEDICAL DEVICES AND SUPPLIES*  Rash. Allergies Reconciled   Medication History  HYDROcodone-Homatropine (5-1.5MG /5ML Syrup, Oral) Active. Acyclovir (400MG  Tablet, Oral) Active. Loratadine (10MG  Tablet, Oral) Active. Omeprazole (Oral) Specific strength unknown - Active. Restoril (15MG  Capsule, Oral) Active. Zocor (20MG  Tablet, Oral) Active. Zoloft (100MG  Tablet, Oral) Active. Medications Reconciled    Review of Systems General Not Present- Appetite Loss, Chills, Fatigue, Fever, Night Sweats, Weight Gain and Weight Loss. Note: All other systems negative (unless as noted in HPI & included Review of Systems) Skin Not Present- Change in Wart/Mole, Dryness, Hives, Jaundice, New Lesions, Non-Healing Wounds, Rash and Ulcer. HEENT Not Present- Earache, Hearing Loss, Hoarseness, Nose Bleed, Oral Ulcers, Ringing in the Ears, Seasonal Allergies, Sinus Pain, Sore Throat, Visual Disturbances, Wears glasses/contact lenses and Yellow Eyes. Respiratory Not Present- Bloody sputum, Chronic Cough, Difficulty Breathing, Snoring and Wheezing. Breast Not Present- Breast Mass, Breast Pain, Nipple Discharge and Skin  Changes. Cardiovascular Not Present- Chest Pain, Difficulty Breathing Lying Down, Leg Cramps, Palpitations, Rapid Heart Rate, Shortness of Breath and Swelling of Extremities. Gastrointestinal Not Present- Abdominal Pain, Bloating, Bloody Stool, Change in Bowel Habits, Chronic diarrhea, Constipation, Difficulty Swallowing, Excessive gas, Gets full quickly at meals, Hemorrhoids, Indigestion, Nausea, Rectal Pain and Vomiting. Female Genitourinary Not Present- Frequency, Nocturia, Painful Urination, Pelvic Pain and Urgency. Musculoskeletal Not Present- Back Pain, Joint Pain, Joint Stiffness, Muscle Pain, Muscle Weakness and Swelling of Extremities. Neurological Not Present- Decreased Memory, Fainting, Headaches, Numbness, Seizures, Tingling, Tremor, Trouble walking and Weakness. Psychiatric Not Present- Anxiety, Bipolar, Change in Sleep Pattern, Depression, Fearful and Frequent crying. Endocrine Not Present- Cold Intolerance, Excessive Hunger, Hair Changes, Heat Intolerance, Hot flashes and New Diabetes. Hematology Not Present- Easy Bruising, Excessive bleeding, Gland problems, HIV and Persistent Infections.  Vitals  Weight: 138.25 lb Height: 62in Body Surface Area: 1.63 m Body Mass Index: 25.29 kg/m  Temp.: 86F  Pulse: 112 (Regular)  P.OX: 97% (Room air) BP: 140/82(Sitting, Left Arm, Standard)       Physical Exam  General Mental Status-Alert. General Appearance-Consistent with stated age. Hydration-Well hydrated. Voice-Normal.  Head and Neck Head-normocephalic, atraumatic with no lesions or palpable masses. Trachea-midline. Thyroid Gland Characteristics - normal size and consistency.  Eye Eyeball - Bilateral-Extraocular movements intact. Sclera/Conjunctiva - Bilateral-No scleral icterus.  Chest and Lung Exam Chest and lung exam reveals -quiet, even and easy respiratory effort with no use of accessory muscles and on auscultation, normal breath  sounds, no adventitious sounds and normal vocal resonance. Inspection Chest Wall - Normal. Back - normal.  Breast Note: There is now no palpable mass in either breast. There is no palpable axillary, supraclavicular, or cervical lymphadenopathy.   Cardiovascular Cardiovascular examination reveals -normal heart sounds, regular rate and  rhythm with no murmurs and normal pedal pulses bilaterally.  Abdomen Inspection Inspection of the abdomen reveals - No Hernias. Skin - Scar - no surgical scars. Palpation/Percussion Palpation and Percussion of the abdomen reveal - Soft, Non Tender, No Rebound tenderness, No Rigidity (guarding) and No hepatosplenomegaly. Auscultation Auscultation of the abdomen reveals - Bowel sounds normal.  Neurologic Neurologic evaluation reveals -alert and oriented x 3 with no impairment of recent or remote memory. Mental Status-Normal.  Musculoskeletal Normal Exam - Left-Upper Extremity Strength Normal and Lower Extremity Strength Normal. Normal Exam - Right-Upper Extremity Strength Normal and Lower Extremity Strength Normal.  Lymphatic Head & Neck  General Head & Neck Lymphatics: Bilateral - Description - Normal. Axillary  General Axillary Region: Bilateral - Description - Normal. Tenderness - Non Tender. Femoral & Inguinal  Generalized Femoral & Inguinal Lymphatics: Bilateral - Description - Normal. Tenderness - Non Tender.    Assessment & Plan  MALIGNANT NEOPLASM OF LOWER-INNER QUADRANT OF LEFT BREAST IN FEMALE, ESTROGEN RECEPTOR POSITIVE (C50.312) Impression: The patient was initially diagnosed with a 1.7 cm cancer in the lower inner quadrant of the left breast with negative lymph nodes. She underwent neoadjuvant chemotherapy and has had a complete radiographic response. She is now ready for definitive surgery. I have discussed with her in detail the different options and at this point she favors breast conservation and is a good candidate for  sentinel node biopsy. I have discussed with her in detail the risks and benefits of the operation as well as some of the technical aspects including use of a radioactive seed for localization and she understands and wishes to proceed. She also has a complex sclerosing lesion in the upper left breast that we'll also need to be removed at the same time. We will leave her port in for now. This patient encounter took 20 minutes today to perform the following: take history, perform exam, review outside records, interpret imaging, counsel the patient on their diagnosis and document encounter, findings & plan in the EHR

## 2019-12-14 NOTE — Anesthesia Procedure Notes (Signed)
Anesthesia Regional Block: Pectoralis block   Pre-Anesthetic Checklist: ,, timeout performed, Correct Patient, Correct Site, Correct Laterality, Correct Procedure, Correct Position, site marked, Risks and benefits discussed,  Surgical consent,  Pre-op evaluation,  At surgeon's request and post-op pain management  Laterality: Left  Prep: chloraprep       Needles:  Injection technique: Single-shot  Needle Type: Echogenic Needle     Needle Length: 9cm  Needle Gauge: 21     Additional Needles:   Narrative:  Start time: 12/14/2019 11:37 AM End time: 12/14/2019 11:45 AM Injection made incrementally with aspirations every 5 mL.  Performed by: Personally  Anesthesiologist: Albertha Ghee, MD  Additional Notes: Pt tolerated the procedure well.

## 2019-12-14 NOTE — Discharge Instructions (Signed)
No Tylenol or Ibuprofen until 5:30pm if needed   Post Anesthesia Home Care Instructions  Activity: Get plenty of rest for the remainder of the day. A responsible individual must stay with you for 24 hours following the procedure.  For the next 24 hours, DO NOT: -Drive a car -Paediatric nurse -Drink alcoholic beverages -Take any medication unless instructed by your physician -Make any legal decisions or sign important papers.  Meals: Start with liquid foods such as gelatin or soup. Progress to regular foods as tolerated. Avoid greasy, spicy, heavy foods. If nausea and/or vomiting occur, drink only clear liquids until the nausea and/or vomiting subsides. Call your physician if vomiting continues.  Special Instructions/Symptoms: Your throat may feel dry or sore from the anesthesia or the breathing tube placed in your throat during surgery. If this causes discomfort, gargle with warm salt water. The discomfort should disappear within 24 hours.  If you had a scopolamine patch placed behind your ear for the management of post- operative nausea and/or vomiting:  1. The medication in the patch is effective for 72 hours, after which it should be removed.  Wrap patch in a tissue and discard in the trash. Wash hands thoroughly with soap and water. 2. You may remove the patch earlier than 72 hours if you experience unpleasant side effects which may include dry mouth, dizziness or visual disturbances. 3. Avoid touching the patch. Wash your hands with soap and water after contact with the patch.

## 2019-12-14 NOTE — Anesthesia Procedure Notes (Signed)
Procedure Name: LMA Insertion Date/Time: 12/14/2019 12:36 PM Performed by: Glory Buff, CRNA Pre-anesthesia Checklist: Patient identified, Emergency Drugs available, Suction available and Patient being monitored Patient Re-evaluated:Patient Re-evaluated prior to induction Oxygen Delivery Method: Circle system utilized Preoxygenation: Pre-oxygenation with 100% oxygen Induction Type: IV induction LMA: LMA inserted LMA Size: 4.0 Number of attempts: 1 Placement Confirmation: positive ETCO2 Tube secured with: Tape Dental Injury: Teeth and Oropharynx as per pre-operative assessment

## 2019-12-14 NOTE — Progress Notes (Signed)
      Enhanced Recovery after Surgery for Orthopedics Enhanced Recovery after Surgery is a protocol used to improve the stress on your body and your recovery after surgery.  Patient Instructions  . The night before surgery:  o No food after midnight. ONLY clear liquids after midnight  . The day of surgery (if you do NOT have diabetes):  o Drink ONE (1) Pre-Surgery Clear Ensure as directed.   o This drink was given to you during your hospital  pre-op appointment visit. o The pre-op nurse will instruct you on the time to drink the  Pre-Surgery Ensure depending on your surgery time. o Finish the drink at the designated time by the pre-op nurse.  o Nothing else to drink after completing the  Pre-Surgery Clear Ensure.  . The day of surgery (if you have diabetes): o Drink ONE (1) Gatorade 2 (G2) as directed. o This drink was given to you during your hospital  pre-op appointment visit.  o The pre-op nurse will instruct you on the time to drink the   Gatorade 2 (G2) depending on your surgery time. o Color of the Gatorade may vary. Red is not allowed. o Nothing else to drink after completing the  Gatorade 2 (G2).         If you have questions, please contact your surgeon's office.  Patient will not be able to complete shower with surgical soap.

## 2019-12-15 ENCOUNTER — Encounter (HOSPITAL_BASED_OUTPATIENT_CLINIC_OR_DEPARTMENT_OTHER): Payer: Self-pay | Admitting: General Surgery

## 2019-12-15 NOTE — Anesthesia Postprocedure Evaluation (Signed)
Anesthesia Post Note  Patient: Kathy Barker  Procedure(s) Performed: LEFT BREAST LUMPECTOMY X 2 WITH RADIOACTIVE SEED AND SENTINEL LYMPH NODE BIOPSY (Left Breast)     Patient location during evaluation: PACU Anesthesia Type: General Level of consciousness: awake and alert Pain management: pain level controlled Vital Signs Assessment: post-procedure vital signs reviewed and stable Respiratory status: spontaneous breathing, nonlabored ventilation, respiratory function stable and patient connected to nasal cannula oxygen Cardiovascular status: blood pressure returned to baseline and stable Postop Assessment: no apparent nausea or vomiting Anesthetic complications: no   No complications documented.  Last Vitals:  Vitals:   12/14/19 1500 12/14/19 1515  BP: 127/70 132/68  Pulse: 85 85  Resp: 18 18  Temp:  37.4 C  SpO2: 96% 98%    Last Pain:  Vitals:   12/14/19 1515  TempSrc:   PainSc: 0-No pain                 Wynona Duhamel S

## 2019-12-20 ENCOUNTER — Encounter: Payer: Self-pay | Admitting: *Deleted

## 2019-12-20 ENCOUNTER — Telehealth: Payer: Self-pay | Admitting: *Deleted

## 2019-12-20 NOTE — Telephone Encounter (Signed)
Received fax from [facility] requesting pt information from November until present from cancer center visits, labs and treatment plans. Information was faxed to [phone]. Confirmation was received.

## 2019-12-30 LAB — SURGICAL PATHOLOGY

## 2020-01-02 ENCOUNTER — Other Ambulatory Visit: Payer: Self-pay | Admitting: Oncology

## 2020-01-05 ENCOUNTER — Ambulatory Visit: Payer: 59 | Admitting: Physical Therapy

## 2020-01-16 NOTE — Progress Notes (Signed)
Location of Breast Cancer: Malignant neoplasm of lower-inner quadrant of LEFT breast, estrogen receptor positive   Histology per Pathology Report:  12/14/2019 FINAL MICROSCOPIC DIAGNOSIS:  A. BREAST, LEFT, LUMPECTOMY:  - Complex sclerosing lesion.  - Fibrocystic changes with usual ductal hyperplasia and calcifications.  - Biopsy clip  - No malignancy identified.  B. LYMPH NODE, LEFT #1, SENTINEL, EXCISION:  - One lymph node with no metastatic carcinoma (0/1).  C. LYMPH NODE, LEFT #2, SENTINEL, EXCISION:  - One lymph node with no metastatic carcinoma (0/1).  D. LYMPH NODE, LEFT #3, SENTINEL, EXCISION:  - One lymph node with no metastatic carcinoma (0/1).  E. BREAST, LEFT, LUMPECTOMY:  - Invasive ductal carcinoma, 0.3 cm.  - Margins not involved.  - Biopsy site and biopsy clip.  Receptor Status: ER(70%), PR (0%), Her2-neu (Negative), Ki-67(80%)  Did patient present with symptoms (if so, please note symptoms) or was this found on screening mammography?:  Patient noted a palpable lower-inner left breast lump and immediately brought it to medical attention.. She underwent bilateral diagnostic mammography with tomography and left breast ultrasonography at [facility] on 05/30/2019 showing: breast density category B; 1.7 cm lobulated mass in left breast at 8 o'clock; no significant left axillary abnormalities  Past/Anticipated interventions by surgeon, if any: 12/14/2019 Dr. Autumn Messing LEFT BREAST LUMPECTOMY X 2 WITH RADIOACTIVE SEED LOCALIZATION AND DEEP LEFT AXILLARY SENTINEL LYMPH NODE BIOPSY  Past/Anticipated interventions by medical oncology, if any:  Under care of Dr. Sarajane Jews [name] 11/25/2019 (1) Oncotype score of 51 predicts a risk of recurrence outside the breast in the next 9 years of greater than 39% if the patient's only systemic therapy is antiestrogens for 5 years.  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 Reports she plans to reschedule her PT assessment now that she's feeling better    Pain issues, if any:  Patient denies    SAFETY ISSUES:  Prior radiation? No  Pacemaker/ICD? No  Possible current pregnancy? No--hysterectomy in 1999  Is the patient on methotrexate? No  Current Complaints / other details:   Patient has received both Pfizer vaccines but has not received booster or flu shot for the year.

## 2020-01-17 ENCOUNTER — Ambulatory Visit
Admission: RE | Admit: 2020-01-17 | Discharge: 2020-01-17 | Disposition: A | Discharge status: Home / Self Care | Payer: 59 | Source: Ambulatory Visit | Attending: Radiation Oncology | Admitting: Radiation Oncology

## 2020-01-17 ENCOUNTER — Encounter: Payer: Self-pay | Admitting: Radiation Oncology

## 2020-01-17 ENCOUNTER — Other Ambulatory Visit: Payer: Self-pay

## 2020-01-17 DIAGNOSIS — C50312 Malignant neoplasm of lower-inner quadrant of left female breast: Secondary | ICD-10-CM

## 2020-01-17 NOTE — Progress Notes (Signed)
Radiation Oncology         [phone] ________________________________  Name: Kathy Barker MRN: [id number]  Date: 01/17/2020  DOB: [birth year]  Follow-Up Visit Note by telephone.  The patient opted for telemedicine to maximize safety during the pandemic.  MyChart video was not obtainable.   Outpatient  CC: Kathy Neer, MD  [name], [name], MD  Diagnosis:      ICD-10-CM   1. Malignant neoplasm of lower-inner quadrant of left breast in female, estrogen receptor positive ([place])  C50.312    Z17.0    Cancer Staging Malignant neoplasm of lower-inner quadrant of left breast in female, estrogen receptor positive ([place]) Staging form: Breast, AJCC 8th Edition - Clinical stage from 06/08/2019: Stage IB (cT1c, cN0, cM0, G3, ER+, PR-, HER2-) - Unsigned  ypT1a, ypN0   CHIEF COMPLAINT: Here to discuss management of left breast cancer  Narrative:  The patient returns today for follow-up. She was seen in the multidisciplinary breast clinic on 06/08/2019, during which time it was recommended that she proceed with breast conserving surgery, chemotherapy, radiation therapy, and antiestrogens.   Since consultation date, she underwent the following imaging (dates and results as follows):  1. MRI of bilateral breasts on 06/29/2019 showed the biopsy-proven malignancy in the lower inner left breast that measured 2.7 x 1.9 x 1.2 cm. There was also noted to be a 1.6 cm linear clumped non-mass enhancement in the central upper left breast. Additionally, there were multiple enhancing masses in the central left breast all together that measured 2.9 x 2.7 cm and a 0.7 cm enhancing mass in the lower central left breast. There was no MRI evidence of malignancy in the right breast. 2. Ultrasound of the left breast on 07/13/2019 showed no sonographic correlate for the MRI findings in the left breast. 3. MRI of bilateral breasts on 10/24/2019 showed treatment response, as the known lower inner left breast  malignancy was no longer visualized. There were no suspicious findings within either breast, nor were there any abnormal appearing lymph nodes.  Systemic therapy, if applicable, involved (dates and therapy as follows): 1 .Neoadjuvant chemotherapy consisting of Doxorubicin and Cyclophosphamide in dose dense fashion x4 from 06/30/2019 - 08/23/2019 followed 2. 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  Symptomatically, the patient reports:  Lymphedema issues, if any:  Patient denies. Reports she plans to reschedule her PT assessment now that she's feeling better    Pain issues, if any:  Patient denies    SAFETY ISSUES:  Prior radiation? No  Pacemaker/ICD? No  Possible current pregnancy? No--hysterectomy in 1999  Is the patient on methotrexate? No  Current Complaints / other details:   Patient has received both Pfizer vaccines but has not received booster or flu shot for the year.           ALLERGIES:  is allergic to fentanyl, other, versed [midazolam], and latex.  Meds: Current Outpatient Medications  Medication Sig Dispense Refill  . acyclovir (ZOVIRAX) 400 MG tablet Take 2 tablets tid for 10 days then decrease to 1 tablet twice a day 120 tablet 3  . omeprazole (PRILOSEC) 40 MG capsule Take one capsule daily for 5 days starting on chemo day, then daily  as needed 60 capsule 3  . sertraline (ZOLOFT) 100 MG tablet Take 100 mg by mouth daily.    . temazepam (RESTORIL) 15 MG capsule Take 15 mg by mouth at bedtime.    [name] HYDROcodone-acetaminophen (NORCO/VICODIN) 5-325 MG tablet Take 1-2 tablets by mouth every 6 (six) hours as needed for  moderate pain or severe pain. (Patient not taking: Reported on 01/17/2020) 15 tablet 0  . HYDROMET 5-1.5 MG/5ML syrup SMARTSIG:2 Milliliter(s) By Mouth 10 Times Daily PRN (Patient not taking: Reported on 01/17/2020)    . LIVALO 4 MG TABS Take 1 tablet by mouth daily.    . SYMBICORT 160-4.5 MCG/ACT inhaler Inhale 2 puffs into the lungs 2 (two) times daily.     No current facility-administered medications for this encounter.    Physical Findings:  vitals were not taken for this visit. .    General: Alert and oriented, in no acute distress   Lab Findings: Lab Results  Component Value Date   WBC 4.9 10/26/2019   HGB 11.8 (L) 10/26/2019   HCT 35.3 (L) 10/26/2019   MCV 98.3 10/26/2019   PLT 165 10/26/2019    Radiographic Findings: As above  Impression/Plan: Left breast cancer  We discussed adjuvant radiotherapy today.  I recommend a 4-week course of radiation therapy to the left breast in order to reduce risk of local regional recurrence by two thirds.  I reviewed the logistics, benefits, risks, and potential side effects of this treatment in detail. Risks may include but not necessary be limited to acute and late injury tissue in the radiation fields such as skin irritation (change in color/pigmentation, itching, dryness, pain, peeling). She may experience fatigue. We also discussed possible risk of long term cosmetic changes or scar tissue. There is also a smaller risk for lung toxicity, cardiac toxicity, lymphedema, musculoskeletal changes, rib fragility or induction of a second malignancy, late chronic non-healing soft tissue wound.    The patient asked good questions which I answered to her satisfaction. She is enthusiastic about proceeding with treatment.  We discussed measures to reduce the risk of infection during the COVID-19 pandemic.  She is due for her booster.  She understands that she will still be at  risk for catching Covid during this Omicron surge, but that the booster can  reduce her risk of severe illness.  She would like to receive the boostser so we will arrange for her to receive her booster at the cancer center tomorrow when she comes in for treatment planning.  Anticipate starting radiation therapy next week.   This encounter was provided by telemedicine platform; patient desired telemedicine during pandemic precautions.  MyChart video was not available so telephone was used. The patient has given verbal consent for this type of encounter and has been advised to only accept a meeting of this type in a secure network environment. On date of service, in total, I spent 35 minutes on this encounter.   The attendants for this meeting include Lonie Peak  and Kathrene Alu Brzozowski During the encounter, Lonie Peak was located at [facility] Radiation Oncology Department.  Kathrene Alu Wiltse was located at home.   _____________________________________   Lonie Peak, MD  This document serves as a record of services personally performed by Lonie Peak, MD. It was created on his behalf by Nikki Dom, a trained medical scribe. The creation of this record is based on the scribe's personal observations and the provider's statements to them. This document has been checked  and approved by the attending provider.

## 2020-01-18 ENCOUNTER — Ambulatory Visit
Admission: RE | Admit: 2020-01-18 | Discharge: 2020-01-18 | Disposition: A | Discharge status: Home / Self Care | Payer: 59 | Source: Ambulatory Visit | Attending: Radiation Oncology | Admitting: Radiation Oncology

## 2020-01-18 ENCOUNTER — Inpatient Hospital Stay: Payer: 59

## 2020-01-18 ENCOUNTER — Other Ambulatory Visit: Payer: Self-pay

## 2020-01-18 DIAGNOSIS — Z23 Encounter for immunization: Secondary | ICD-10-CM | POA: Insufficient documentation

## 2020-01-18 DIAGNOSIS — C50312 Malignant neoplasm of lower-inner quadrant of left female breast: Secondary | ICD-10-CM | POA: Insufficient documentation

## 2020-01-18 DIAGNOSIS — Z170 Estrogen receptor positive status [ER+]: Secondary | ICD-10-CM | POA: Insufficient documentation

## 2020-01-18 DIAGNOSIS — Z510 Encounter for antineoplastic radiation therapy: Secondary | ICD-10-CM | POA: Insufficient documentation

## 2020-01-20 DIAGNOSIS — Z510 Encounter for antineoplastic radiation therapy: Secondary | ICD-10-CM | POA: Diagnosis not present

## 2020-01-23 ENCOUNTER — Encounter: Payer: Self-pay | Admitting: *Deleted

## 2020-01-24 ENCOUNTER — Ambulatory Visit
Admission: RE | Admit: 2020-01-24 | Discharge: 2020-01-24 | Disposition: A | Discharge status: Home / Self Care | Payer: 59 | Source: Ambulatory Visit | Attending: Radiation Oncology | Admitting: Radiation Oncology

## 2020-01-24 DIAGNOSIS — Z510 Encounter for antineoplastic radiation therapy: Secondary | ICD-10-CM | POA: Diagnosis not present

## 2020-01-25 ENCOUNTER — Ambulatory Visit
Admission: RE | Admit: 2020-01-25 | Discharge: 2020-01-25 | Disposition: A | Discharge status: Home / Self Care | Payer: 59 | Source: Ambulatory Visit | Attending: Radiation Oncology | Admitting: Radiation Oncology

## 2020-01-25 ENCOUNTER — Other Ambulatory Visit: Payer: Self-pay

## 2020-01-25 DIAGNOSIS — Z510 Encounter for antineoplastic radiation therapy: Secondary | ICD-10-CM | POA: Diagnosis not present

## 2020-01-26 ENCOUNTER — Ambulatory Visit
Admission: RE | Admit: 2020-01-26 | Discharge: 2020-01-26 | Disposition: A | Discharge status: Home / Self Care | Payer: 59 | Source: Ambulatory Visit | Attending: Radiation Oncology | Admitting: Radiation Oncology

## 2020-01-26 ENCOUNTER — Other Ambulatory Visit: Payer: Self-pay

## 2020-01-26 DIAGNOSIS — Z510 Encounter for antineoplastic radiation therapy: Secondary | ICD-10-CM | POA: Diagnosis not present

## 2020-01-27 ENCOUNTER — Ambulatory Visit
Admission: RE | Admit: 2020-01-27 | Discharge: 2020-01-27 | Disposition: A | Discharge status: Home / Self Care | Payer: 59 | Source: Ambulatory Visit | Attending: Radiation Oncology | Admitting: Radiation Oncology

## 2020-01-27 DIAGNOSIS — Z170 Estrogen receptor positive status [ER+]: Secondary | ICD-10-CM

## 2020-01-27 DIAGNOSIS — Z510 Encounter for antineoplastic radiation therapy: Secondary | ICD-10-CM | POA: Diagnosis not present

## 2020-01-27 DIAGNOSIS — C50312 Malignant neoplasm of lower-inner quadrant of left female breast: Secondary | ICD-10-CM

## 2020-01-27 MED ORDER — RADIAPLEXRX EX GEL: Freq: Once | CUTANEOUS | Status: AC

## 2020-01-27 MED ORDER — ALRA NON-METALLIC DEODORANT (RAD-ONC)
1.0000 "application " | Freq: Once | TOPICAL | Status: AC
  Administered 2020-01-27: 1 via TOPICAL

## 2020-01-27 NOTE — Progress Notes (Signed)

## 2020-01-30 ENCOUNTER — Ambulatory Visit: Payer: 59

## 2020-01-31 ENCOUNTER — Ambulatory Visit: Payer: 59

## 2020-02-01 ENCOUNTER — Ambulatory Visit
Admission: RE | Admit: 2020-02-01 | Discharge: 2020-02-01 | Disposition: A | Discharge status: Home / Self Care | Payer: 59 | Source: Ambulatory Visit | Attending: Radiation Oncology | Admitting: Radiation Oncology

## 2020-02-01 DIAGNOSIS — Z510 Encounter for antineoplastic radiation therapy: Secondary | ICD-10-CM | POA: Diagnosis not present

## 2020-02-02 ENCOUNTER — Ambulatory Visit
Admission: RE | Admit: 2020-02-02 | Discharge: 2020-02-02 | Disposition: A | Discharge status: Home / Self Care | Payer: 59 | Source: Ambulatory Visit | Attending: Radiation Oncology | Admitting: Radiation Oncology

## 2020-02-02 DIAGNOSIS — Z510 Encounter for antineoplastic radiation therapy: Secondary | ICD-10-CM | POA: Diagnosis not present

## 2020-02-03 ENCOUNTER — Ambulatory Visit
Admission: RE | Admit: 2020-02-03 | Discharge: 2020-02-03 | Disposition: A | Discharge status: Home / Self Care | Payer: 59 | Source: Ambulatory Visit | Attending: Radiation Oncology | Admitting: Radiation Oncology

## 2020-02-03 ENCOUNTER — Other Ambulatory Visit: Payer: Self-pay

## 2020-02-03 DIAGNOSIS — Z510 Encounter for antineoplastic radiation therapy: Secondary | ICD-10-CM | POA: Diagnosis not present

## 2020-02-06 ENCOUNTER — Ambulatory Visit
Admission: RE | Admit: 2020-02-06 | Discharge: 2020-02-06 | Disposition: A | Discharge status: Home / Self Care | Payer: 59 | Source: Ambulatory Visit | Attending: Radiation Oncology | Admitting: Radiation Oncology

## 2020-02-06 ENCOUNTER — Ambulatory Visit: Payer: 59

## 2020-02-06 DIAGNOSIS — Z510 Encounter for antineoplastic radiation therapy: Secondary | ICD-10-CM | POA: Diagnosis not present

## 2020-02-07 ENCOUNTER — Ambulatory Visit
Admission: RE | Admit: 2020-02-07 | Discharge: 2020-02-07 | Disposition: A | Discharge status: Home / Self Care | Payer: 59 | Source: Ambulatory Visit | Attending: Radiation Oncology | Admitting: Radiation Oncology

## 2020-02-07 DIAGNOSIS — Z510 Encounter for antineoplastic radiation therapy: Secondary | ICD-10-CM | POA: Diagnosis not present

## 2020-02-08 ENCOUNTER — Ambulatory Visit
Admission: RE | Admit: 2020-02-08 | Discharge: 2020-02-08 | Disposition: A | Discharge status: Home / Self Care | Payer: 59 | Source: Ambulatory Visit | Attending: Radiation Oncology | Admitting: Radiation Oncology

## 2020-02-08 ENCOUNTER — Other Ambulatory Visit: Payer: Self-pay

## 2020-02-08 DIAGNOSIS — Z510 Encounter for antineoplastic radiation therapy: Secondary | ICD-10-CM | POA: Diagnosis not present

## 2020-02-09 ENCOUNTER — Ambulatory Visit
Admission: RE | Admit: 2020-02-09 | Discharge: 2020-02-09 | Disposition: A | Discharge status: Home / Self Care | Payer: 59 | Source: Ambulatory Visit | Attending: Radiation Oncology | Admitting: Radiation Oncology

## 2020-02-09 ENCOUNTER — Other Ambulatory Visit: Payer: Self-pay

## 2020-02-09 DIAGNOSIS — Z510 Encounter for antineoplastic radiation therapy: Secondary | ICD-10-CM | POA: Diagnosis not present

## 2020-02-10 ENCOUNTER — Ambulatory Visit: Payer: 59

## 2020-02-13 ENCOUNTER — Ambulatory Visit
Admission: RE | Admit: 2020-02-13 | Discharge: 2020-02-13 | Disposition: A | Discharge status: Home / Self Care | Payer: 59 | Source: Ambulatory Visit | Attending: Radiation Oncology | Admitting: Radiation Oncology

## 2020-02-13 ENCOUNTER — Other Ambulatory Visit: Payer: Self-pay

## 2020-02-13 DIAGNOSIS — Z510 Encounter for antineoplastic radiation therapy: Secondary | ICD-10-CM | POA: Diagnosis not present

## 2020-02-14 ENCOUNTER — Ambulatory Visit: Payer: 59

## 2020-02-14 ENCOUNTER — Ambulatory Visit
Admission: RE | Admit: 2020-02-14 | Discharge: 2020-02-14 | Disposition: A | Discharge status: Home / Self Care | Payer: 59 | Source: Ambulatory Visit | Attending: Radiation Oncology | Admitting: Radiation Oncology

## 2020-02-14 DIAGNOSIS — Z170 Estrogen receptor positive status [ER+]: Secondary | ICD-10-CM | POA: Diagnosis not present

## 2020-02-14 DIAGNOSIS — T451X5A Adverse effect of antineoplastic and immunosuppressive drugs, initial encounter: Secondary | ICD-10-CM | POA: Diagnosis not present

## 2020-02-14 DIAGNOSIS — Z803 Family history of malignant neoplasm of breast: Secondary | ICD-10-CM | POA: Diagnosis not present

## 2020-02-14 DIAGNOSIS — Z510 Encounter for antineoplastic radiation therapy: Secondary | ICD-10-CM | POA: Insufficient documentation

## 2020-02-14 DIAGNOSIS — R5383 Other fatigue: Secondary | ICD-10-CM | POA: Diagnosis not present

## 2020-02-14 DIAGNOSIS — Z79899 Other long term (current) drug therapy: Secondary | ICD-10-CM | POA: Diagnosis not present

## 2020-02-14 DIAGNOSIS — Z811 Family history of alcohol abuse and dependence: Secondary | ICD-10-CM | POA: Diagnosis not present

## 2020-02-14 DIAGNOSIS — Z90721 Acquired absence of ovaries, unilateral: Secondary | ICD-10-CM | POA: Diagnosis not present

## 2020-02-14 DIAGNOSIS — L598 Other specified disorders of the skin and subcutaneous tissue related to radiation: Secondary | ICD-10-CM | POA: Diagnosis not present

## 2020-02-14 DIAGNOSIS — G620 Drug-induced polyneuropathy: Secondary | ICD-10-CM | POA: Diagnosis not present

## 2020-02-14 DIAGNOSIS — C50312 Malignant neoplasm of lower-inner quadrant of left female breast: Secondary | ICD-10-CM | POA: Insufficient documentation

## 2020-02-14 DIAGNOSIS — Z885 Allergy status to narcotic agent status: Secondary | ICD-10-CM | POA: Diagnosis not present

## 2020-02-14 DIAGNOSIS — Z923 Personal history of irradiation: Secondary | ICD-10-CM | POA: Diagnosis not present

## 2020-02-14 DIAGNOSIS — Z8249 Family history of ischemic heart disease and other diseases of the circulatory system: Secondary | ICD-10-CM | POA: Diagnosis not present

## 2020-02-14 DIAGNOSIS — Z23 Encounter for immunization: Secondary | ICD-10-CM | POA: Insufficient documentation

## 2020-02-15 ENCOUNTER — Ambulatory Visit: Payer: 59

## 2020-02-15 ENCOUNTER — Other Ambulatory Visit: Payer: Self-pay

## 2020-02-15 ENCOUNTER — Ambulatory Visit
Admission: RE | Admit: 2020-02-15 | Discharge: 2020-02-15 | Disposition: A | Discharge status: Home / Self Care | Payer: 59 | Source: Ambulatory Visit | Attending: Radiation Oncology | Admitting: Radiation Oncology

## 2020-02-15 DIAGNOSIS — Z510 Encounter for antineoplastic radiation therapy: Secondary | ICD-10-CM | POA: Diagnosis not present

## 2020-02-15 NOTE — Progress Notes (Signed)
[place]  [phone] [phone]     ID: Kathy Barker DOB: 1960/09/24  MR#: [id number]  HKV#:[id number]  Patient Care Team: [name], MD as PCP - General (Family Medicine) Rockwell Germany, RN as Oncology Nurse Navigator Mauro Kaufmann, RN as Oncology Nurse Navigator [name], MD as Consulting Physician (General Surgery) [name], [name], MD as Consulting Physician (Oncology) [name], MD as Attending Physician (Radiation Oncology) Lavonna Monarch, MD as Consulting Physician (Dermatology) Scot Dock, NP OTHER MD:  CHIEF COMPLAINT: Left-sided breast cancer  CURRENT TREATMENT: Adjuvant radiation therapy   INTERVAL HISTORY: [name] returns today for follow up of her left-sided breast cancer.  She has completed her surgery and is undergoing adjuvant radiation therapy.  She notes that her fatigue started last week and she is about midway through her radiation.   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  She notes her stability is impacted and she is unsure if this is "in her head".  She says she feels as if a cotton ball is in between her toes.  She notes in her hands worse in her right they are achy.  She says she has difficulty writing due to this.  She completed chemotherapy in 09/2019.  It was discontinued early, after three cycles of Paclitaxel due to the neuropathy.  Leonila notes that she otherwise is doing well.  She denies any other issues and a detailed ROS was otherwise non contributory.     COVID 19 VACCINATION STATUS: She has had both immunizations, most recently April 2021   HISTORY OF CURRENT ILLNESS: From the original intake note:  Kathy Barker herself noted a palpable lower-inner left breast lump and immediately brought it to medical attention.. She underwent bilateral diagnostic mammography with tomography and left breast ultrasonography at [facility] on 05/30/2019 showing:  breast density category B; 1.7 cm lobulated mass in left breast at 8 o'clock; no significant left axillary abnormalities.  Accordingly on 06/01/2019 she proceeded to biopsy of the left breast area in question. The pathology from this procedure ([id number]) showed: invasive ductal carcinoma, grade 3. Prognostic indicators significant for: estrogen receptor, 70% positive with weak staining intensity and progesterone receptor, 0% negative. Proliferation marker Ki67 at 80%. HER2 negative by immunohistochemistry (1+).  The patient's subsequent history is as detailed below.   PAST MEDICAL HISTORY: Past Medical History:  Diagnosis Date  . Allergic rhinitis   . Anxiety    "only during my divorce" (11/18/2012)  . Cancer ([place])   . Depression    "only during my divorce" (11/18/2012)  . GERD (gastroesophageal reflux disease)   . High cholesterol   . Hot flashes   . IBS (irritable bowel syndrome)   . Insomnia   . Migraines    "lately more often; before SVT I'd get them ~ q 6 months" (11/18/2012)  . PONV (postoperative nausea and vomiting)   . 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 BREAST LUMPECTOMY WITH RADIOACTIVE SEED AND SENTINEL LYMPH NODE BIOPSY Left 12/14/2019   Procedure: LEFT BREAST LUMPECTOMY X 2 WITH RADIOACTIVE SEED AND SENTINEL LYMPH NODE BIOPSY;  Surgeon: [name], MD;  Location: [place];  Service: General;  Laterality: Left;  PEC BLOCK  . CESAREAN SECTION  1992  . COMBINED HYSTERECTOMY VAGINAL / OOPHORECTOMY /  A&P REPAIR  1999  . IR IMAGING GUIDED PORT INSERTION  06/30/2019  . 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 SUPRAVENTRICULAR TACHYCARDIA ABLATION N/A 11/18/2012   Procedure: SUPRAVENTRICULAR TACHYCARDIA ABLATION;  Surgeon: Coralyn Mark, MD;  Location:  [place] CATH LAB;  Service: Cardiovascular;  Laterality: N/A;  . VAGINAL HYSTERECTOMY  ~ 2000    FAMILY HISTORY: Family History  Problem Relation Age of Onset  . Heart attack Father   . Alcohol abuse Father   . Heart attack Paternal Grandfather   . Heart Problems Other        both sides of family  . Breast cancer Mother    Her father died at age 51 from alcohol abuse. Her mother is living at age 1 (as of 05/2019) and has a history of DCIS at age 63 and invasive lobular breast cancer at age 65. [name] has one brother. She reports cancer of an unknown type in her maternal grandmother and prostate cancer in a maternal uncle.   GYNECOLOGIC HISTORY:  No LMP recorded. Patient has had a hysterectomy. Menarche: 60 years old Age at first live birth: 60 years old [place] P 2 LMP 2000 Contraceptive: never used HRT never used  Hysterectomy? yes BSO? 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  She will be on temporary disability during her chemotherapy.  She is divorced. She lives at home with son Kathy Barker, age 52, who works in Biomedical scientist in [employer]. Son Kathy Barker, age 19, works in Sales executive here in [place]. 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     ADVANCED DIRECTIVES: Not in place. She intends to name both of her sons as her 6.   HEALTH MAINTENANCE: Social History   Tobacco Use  . Smoking status: Never Smoker  . Smokeless tobacco: Never Used  Vaping Use  . Vaping Use: Never used  Substance Use Topics  . Alcohol use: Yes    Comment: 11/18/2012 "shot of brandy couple times/month"  . Drug use: No     Colonoscopy: yes, date unsure  PAP: date unsure  Bone density: never done   Allergies  Allergen Reactions  . Fentanyl Nausea And Vomiting    Per patient " extreme nausea and vomiting "   . Other     Problems with stitches  . Versed [Midazolam] Nausea And Vomiting    Per patient " extreme N/V"   .  Latex Rash    Current Outpatient Medications  Medication Sig Dispense Refill  . acyclovir (ZOVIRAX) 400 MG tablet Take 2 tablets tid for 10 days then decrease to 1 tablet twice a day 120 tablet 3  . HYDROMET 5-1.5 MG/5ML syrup every 4 (four) hours as needed.    [name] omeprazole (PRILOSEC) 40 MG capsule Take one capsule daily for 5 days starting on chemo day, then daily as needed 60 capsule 3  . sertraline (ZOLOFT) 100 MG tablet Take 100 mg by mouth daily.    . temazepam (RESTORIL) 15 MG capsule Take 15 mg by mouth at bedtime.     No current facility-administered medications for this visit.    OBJECTIVE: White woman in no acute distress Vitals:   02/16/20 1249  BP: (!) 155/86  Pulse: 90  Resp: 18  Temp: (!) 97.5 F (36.4 C)  SpO2: 98%     Body mass index is 25.33 kg/m.   Wt Readings  from Last 3 Encounters:  02/16/20 138 lb 8 oz (62.8 kg)  12/14/19 139 lb 12.4 oz (63.4 kg)  11/25/19 137 lb 14.4 oz (62.6 kg)      ECOG FS:1 - Symptomatic but completely ambulatory  GENERAL: Patient is a well appearing female in no acute distress HEENT:  Sclerae anicteric.  Mask in place. Neck is supple.  NODES:  No cervical, supraclavicular, or axillary lymphadenopathy palpated.  BREAST EXAM: left breast with radiation changes, no sign of infection or recurrence, inspected only LUNGS:  Clear to auscultation bilaterally.  No wheezes or rhonchi. HEART:  Regular rate and rhythm. No murmur appreciated. ABDOMEN:  Soft, nontender.  Positive, normoactive bowel sounds. No organomegaly palpated. MSK:  No focal spinal tenderness to palpation. Full range of motion bilaterally in the upper extremities. EXTREMITIES:  No peripheral edema.   SKIN:  Clear with no obvious rashes or skin changes. No nail dyscrasia. NEURO:  Nonfocal. Well oriented.  Appropriate affect.     LAB RESULTS:  CMP     Component Value Date/Time   NA 141 10/26/2019 0822   K 3.9 10/26/2019 0822   CL 108 10/26/2019 0822   CO2 26  10/26/2019 0822   GLUCOSE 119 (H) 10/26/2019 0822   BUN 9 10/26/2019 0822   CREATININE 0.74 10/26/2019 0822   CALCIUM 9.3 10/26/2019 0822   PROT 6.5 10/26/2019 0822   ALBUMIN 3.8 10/26/2019 0822   AST 23 10/26/2019 0822   ALT 37 10/26/2019 0822   ALKPHOS 123 10/26/2019 0822   BILITOT 0.3 10/26/2019 0822   GFRNONAA >60 10/26/2019 0822   GFRAA >60 10/04/2019 1241    No results found for: TOTALPROTELP, ALBUMINELP, A1GS, A2GS, BETS, BETA2SER, GAMS, MSPIKE, SPEI  Lab Results  Component Value Date   WBC 5.1 02/16/2020   NEUTROABS 3.6 02/16/2020   HGB 13.4 02/16/2020   HCT 40.0 02/16/2020   MCV 92.8 02/16/2020   PLT 180 02/16/2020    No results found for: LABCA2  No components found for: [id number]  No results for input(s): INR in the last 168 hours.  No results found for: LABCA2  No results found for: NWG956  No results found for: OZH086  No results found for: VHQ469  No results found for: CA2729  No components found for: HGQUANT  No results found for: CEA1 / No results found for: CEA1   No results found for: AFPTUMOR  No results found for: CHROMOGRNA  No results found for: KPAFRELGTCHN, LAMBDASER, KAPLAMBRATIO (kappa/lambda light chains)  No results found for: HGBA, HGBA2QUANT, HGBFQUANT, HGBSQUAN (Hemoglobinopathy evaluation)   No results found for: LDH  No results found for: IRON, TIBC, IRONPCTSAT (Iron and TIBC)  No results found for: FERRITIN  Urinalysis No results found for: COLORURINE, APPEARANCEUR, LABSPEC, PHURINE, GLUCOSEU, HGBUR, BILIRUBINUR, KETONESUR, PROTEINUR, UROBILINOGEN, NITRITE, LEUKOCYTESUR   STUDIES: No results found.   ELIGIBLE FOR AVAILABLE RESEARCH PROTOCOL: no  ASSESSMENT: 60 y.o. [place] woman status post left breast lower inner quadrant biopsy 06/01/2019 for a clinical T1c N0, stage IB invasive ductal carcinoma, grade 3, weakly estrogen receptor positive but functionally triple negative, with an MIB-1 of 80%.  (1)  Oncotype score of 51 predicts a risk of recurrence outside the breast in the next 9 years of greater than 39% if the patient's only systemic therapy is antiestrogens for 5 years.  It also predicts a greater than 15% benefit from chemotherapy  (2) neoadjuvant chemotherapy consisting of doxorubicin and cyclophosphamide in dose dense fashion x4 started 06/30/2019, completed 08/23/2019,  followed by weekly paclitaxel and carboplatin weekly x12 starting 09/13/2019, discontinued after 10/04/2019 dose  (a) carboplatin/paclitaxel discontinued after 3 doses with neuropathy developing  (b) repeat breast MRI 10/24/2019 shows resolution of the known malignancy  (3) Left lumpectomy on 12/14/2019 shows residual 0.3 cm invasive ductal carcinoma, margins negative, 3 sentinel lymph nodes negative.   (4) adjuvant radiation beginning 01/24/2020   PLAN: Yumna is doing quite well today.  She continues on with adjuvant radiation therapy with good tolerance.  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    Stephonie and I discussed her overall breast cancer treatment plan.  I reviewed with her that she did not have estrogen positive breast cancer, so in her case, anti estrogen pills are not indicated.  She would like her port removed, and I put in an order for this to happen.    Rahel is having worsening neuropathy.  I placed a referral for her to see PT about this.  It is slightly atypical for it to worsen a few months after chemo completion.  We discussed potentially starting gabapentin or referring to neurology if it continues or worsens.    Conita will return in 8 weeks for labs and f/u with Dr. Jana Hakim.  She knows to call for any questions that may arise between now and her next appointment.  We are happy to see her sooner if needed.  She knows to call for any questions that may arise between now and her next appointment.  We are happy to see her sooner if needed.  Total encounter time: 30  minutes*   Wilber Bihari, NP 02/16/20 1:04 PM Medical Oncology and Hematology [facility] [place], [place] [address] Tel. [phone]    Fax. [phone]   *Total Encounter Time as defined by the Centers for Medicare and Medicaid Services includes, in addition to the face-to-face time of a patient visit (documented in the note above) non-face-to-face time: obtaining and reviewing outside history, ordering and reviewing medications, tests or procedures, care coordination (communications with other health care professionals or caregivers) and documentation in the medical record.

## 2020-02-16 ENCOUNTER — Inpatient Hospital Stay: Payer: 59 | Attending: Oncology | Admitting: Adult Health

## 2020-02-16 ENCOUNTER — Encounter: Payer: Self-pay | Admitting: *Deleted

## 2020-02-16 ENCOUNTER — Ambulatory Visit: Payer: 59

## 2020-02-16 ENCOUNTER — Inpatient Hospital Stay: Payer: 59

## 2020-02-16 ENCOUNTER — Encounter: Payer: Self-pay | Admitting: Adult Health

## 2020-02-16 ENCOUNTER — Other Ambulatory Visit: Payer: Self-pay

## 2020-02-16 VITALS — BP 155/86 | HR 90 | Temp 97.5°F | Resp 18 | Ht 62.0 in | Wt 138.5 lb

## 2020-02-16 DIAGNOSIS — Z23 Encounter for immunization: Secondary | ICD-10-CM | POA: Insufficient documentation

## 2020-02-16 DIAGNOSIS — C50312 Malignant neoplasm of lower-inner quadrant of left female breast: Secondary | ICD-10-CM

## 2020-02-16 DIAGNOSIS — Z923 Personal history of irradiation: Secondary | ICD-10-CM | POA: Insufficient documentation

## 2020-02-16 DIAGNOSIS — T451X5A Adverse effect of antineoplastic and immunosuppressive drugs, initial encounter: Secondary | ICD-10-CM | POA: Insufficient documentation

## 2020-02-16 DIAGNOSIS — Z8249 Family history of ischemic heart disease and other diseases of the circulatory system: Secondary | ICD-10-CM | POA: Insufficient documentation

## 2020-02-16 DIAGNOSIS — Z79899 Other long term (current) drug therapy: Secondary | ICD-10-CM | POA: Insufficient documentation

## 2020-02-16 DIAGNOSIS — Z510 Encounter for antineoplastic radiation therapy: Secondary | ICD-10-CM | POA: Diagnosis not present

## 2020-02-16 DIAGNOSIS — Z803 Family history of malignant neoplasm of breast: Secondary | ICD-10-CM | POA: Insufficient documentation

## 2020-02-16 DIAGNOSIS — Z811 Family history of alcohol abuse and dependence: Secondary | ICD-10-CM | POA: Insufficient documentation

## 2020-02-16 DIAGNOSIS — Z170 Estrogen receptor positive status [ER+]: Secondary | ICD-10-CM | POA: Diagnosis not present

## 2020-02-16 DIAGNOSIS — Z885 Allergy status to narcotic agent status: Secondary | ICD-10-CM | POA: Insufficient documentation

## 2020-02-16 DIAGNOSIS — Z95828 Presence of other vascular implants and grafts: Secondary | ICD-10-CM

## 2020-02-16 DIAGNOSIS — L598 Other specified disorders of the skin and subcutaneous tissue related to radiation: Secondary | ICD-10-CM | POA: Insufficient documentation

## 2020-02-16 DIAGNOSIS — R5383 Other fatigue: Secondary | ICD-10-CM | POA: Insufficient documentation

## 2020-02-16 DIAGNOSIS — Z90721 Acquired absence of ovaries, unilateral: Secondary | ICD-10-CM | POA: Insufficient documentation

## 2020-02-16 DIAGNOSIS — G620 Drug-induced polyneuropathy: Secondary | ICD-10-CM | POA: Insufficient documentation

## 2020-02-16 LAB — CBC WITH DIFFERENTIAL (CANCER CENTER ONLY)
Abs Immature Granulocytes: 0.01 10*3/uL (ref 0.00–0.07)
Basophils Absolute: 0 10*3/uL (ref 0.0–0.1)
Basophils Relative: 0 %
Eosinophils Absolute: 0.1 10*3/uL (ref 0.0–0.5)
Eosinophils Relative: 1 %
HCT: 40 % (ref 36.0–46.0)
Hemoglobin: 13.4 g/dL (ref 12.0–15.0)
Immature Granulocytes: 0 %
Lymphocytes Relative: 21 %
Lymphs Abs: 1.1 10*3/uL (ref 0.7–4.0)
MCH: 31.1 pg (ref 26.0–34.0)
MCHC: 33.5 g/dL (ref 30.0–36.0)
MCV: 92.8 fL (ref 80.0–100.0)
Monocytes Absolute: 0.4 10*3/uL (ref 0.1–1.0)
Monocytes Relative: 7 %
Neutro Abs: 3.6 10*3/uL (ref 1.7–7.7)
Neutrophils Relative %: 71 %
Platelet Count: 180 10*3/uL (ref 150–400)
RBC: 4.31 MIL/uL (ref 3.87–5.11)
RDW: 13.5 % (ref 11.5–15.5)
WBC Count: 5.1 10*3/uL (ref 4.0–10.5)
nRBC: 0 % (ref 0.0–0.2)

## 2020-02-16 LAB — CMP (CANCER CENTER ONLY)
ALT: 49 U/L — ABNORMAL HIGH (ref 0–44)
AST: 31 U/L (ref 15–41)
Albumin: 4.2 g/dL (ref 3.5–5.0)
Alkaline Phosphatase: 137 U/L — ABNORMAL HIGH (ref 38–126)
Anion gap: 8 (ref 5–15)
BUN: 10 mg/dL (ref 8–23)
CO2: 25 mmol/L (ref 22–32)
Calcium: 9.3 mg/dL (ref 8.9–10.3)
Chloride: 107 mmol/L (ref 98–111)
Creatinine: 0.76 mg/dL (ref 0.44–1.00)
GFR, Estimated: >60 mL/min (ref >60)
Glucose, Bld: 99 mg/dL (ref 70–99)
Potassium: 4.1 mmol/L (ref 3.5–5.1)
Sodium: 140 mmol/L (ref 135–145)
Total Bilirubin: 0.4 mg/dL (ref 0.3–1.2)
Total Protein: 7 g/dL (ref 6.5–8.1)

## 2020-02-16 MED ORDER — SODIUM CHLORIDE 0.9% FLUSH
10.0000 mL | Freq: Once | INTRAVENOUS | Status: AC
  Administered 2020-02-16: 10 mL
  Filled 2020-02-16: qty 10

## 2020-02-16 MED ORDER — HEPARIN SOD (PORK) LOCK FLUSH 100 UNIT/ML IV SOLN
500.0000 [IU] | Freq: Once | INTRAVENOUS | Status: AC
  Administered 2020-02-16: 500 [IU]
  Filled 2020-02-16: qty 5

## 2020-02-17 ENCOUNTER — Ambulatory Visit: Payer: 59

## 2020-02-17 DIAGNOSIS — Z510 Encounter for antineoplastic radiation therapy: Secondary | ICD-10-CM | POA: Diagnosis not present

## 2020-02-20 ENCOUNTER — Ambulatory Visit: Payer: 59

## 2020-02-20 ENCOUNTER — Other Ambulatory Visit: Payer: Self-pay | Admitting: Oncology

## 2020-02-21 ENCOUNTER — Ambulatory Visit: Payer: 59

## 2020-02-21 ENCOUNTER — Ambulatory Visit
Admission: RE | Admit: 2020-02-21 | Discharge: 2020-02-21 | Disposition: A | Discharge status: Home / Self Care | Payer: 59 | Source: Ambulatory Visit | Attending: Radiation Oncology | Admitting: Radiation Oncology

## 2020-02-21 ENCOUNTER — Telehealth: Payer: Self-pay | Admitting: Oncology

## 2020-02-21 DIAGNOSIS — Z510 Encounter for antineoplastic radiation therapy: Secondary | ICD-10-CM | POA: Diagnosis not present

## 2020-02-21 NOTE — Telephone Encounter (Signed)
Called pt per 2/7 sch msg - no answer and vmail full - mailed letter with appt date and time

## 2020-02-22 ENCOUNTER — Ambulatory Visit
Admission: RE | Admit: 2020-02-22 | Discharge: 2020-02-22 | Disposition: A | Discharge status: Home / Self Care | Payer: 59 | Source: Ambulatory Visit | Attending: Radiation Oncology | Admitting: Radiation Oncology

## 2020-02-22 ENCOUNTER — Ambulatory Visit: Payer: 59

## 2020-02-22 ENCOUNTER — Other Ambulatory Visit: Payer: Self-pay

## 2020-02-22 DIAGNOSIS — Z510 Encounter for antineoplastic radiation therapy: Secondary | ICD-10-CM | POA: Diagnosis not present

## 2020-02-23 ENCOUNTER — Ambulatory Visit
Admission: RE | Admit: 2020-02-23 | Discharge: 2020-02-23 | Disposition: A | Discharge status: Home / Self Care | Payer: 59 | Source: Ambulatory Visit | Attending: Radiation Oncology | Admitting: Radiation Oncology

## 2020-02-23 ENCOUNTER — Ambulatory Visit: Payer: 59

## 2020-02-23 ENCOUNTER — Encounter: Payer: Self-pay | Admitting: *Deleted

## 2020-02-23 DIAGNOSIS — Z510 Encounter for antineoplastic radiation therapy: Secondary | ICD-10-CM | POA: Diagnosis not present

## 2020-02-24 ENCOUNTER — Other Ambulatory Visit: Payer: Self-pay

## 2020-02-24 ENCOUNTER — Ambulatory Visit
Admission: RE | Admit: 2020-02-24 | Discharge: 2020-02-24 | Disposition: A | Discharge status: Home / Self Care | Payer: 59 | Source: Ambulatory Visit | Attending: Radiation Oncology | Admitting: Radiation Oncology

## 2020-02-24 ENCOUNTER — Encounter: Payer: Self-pay | Admitting: Radiation Oncology

## 2020-02-24 DIAGNOSIS — Z510 Encounter for antineoplastic radiation therapy: Secondary | ICD-10-CM | POA: Diagnosis not present

## 2020-02-28 ENCOUNTER — Other Ambulatory Visit: Payer: Self-pay | Admitting: Oncology

## 2020-03-06 ENCOUNTER — Encounter: Payer: Self-pay | Admitting: Physical Therapy

## 2020-03-09 ENCOUNTER — Other Ambulatory Visit: Payer: Self-pay

## 2020-03-09 ENCOUNTER — Inpatient Hospital Stay: Payer: 59

## 2020-03-09 DIAGNOSIS — Z510 Encounter for antineoplastic radiation therapy: Secondary | ICD-10-CM | POA: Diagnosis not present

## 2020-03-09 DIAGNOSIS — Z170 Estrogen receptor positive status [ER+]: Secondary | ICD-10-CM

## 2020-03-09 DIAGNOSIS — C50312 Malignant neoplasm of lower-inner quadrant of left female breast: Secondary | ICD-10-CM

## 2020-03-09 LAB — CBC WITH DIFFERENTIAL (CANCER CENTER ONLY)
Abs Immature Granulocytes: 0.03 10*3/uL (ref 0.00–0.07)
Basophils Absolute: 0 10*3/uL (ref 0.0–0.1)
Basophils Relative: 1 %
Eosinophils Absolute: 0.1 10*3/uL (ref 0.0–0.5)
Eosinophils Relative: 1 %
HCT: 41.7 % (ref 36.0–46.0)
Hemoglobin: 13.9 g/dL (ref 12.0–15.0)
Immature Granulocytes: 1 %
Lymphocytes Relative: 21 %
Lymphs Abs: 1.3 10*3/uL (ref 0.7–4.0)
MCH: 31 pg (ref 26.0–34.0)
MCHC: 33.3 g/dL (ref 30.0–36.0)
MCV: 93.1 fL (ref 80.0–100.0)
Monocytes Absolute: 0.4 10*3/uL (ref 0.1–1.0)
Monocytes Relative: 6 %
Neutro Abs: 4.5 10*3/uL (ref 1.7–7.7)
Neutrophils Relative %: 70 %
Platelet Count: 199 10*3/uL (ref 150–400)
RBC: 4.48 MIL/uL (ref 3.87–5.11)
RDW: 13.4 % (ref 11.5–15.5)
WBC Count: 6.4 10*3/uL (ref 4.0–10.5)
nRBC: 0 % (ref 0.0–0.2)

## 2020-03-09 LAB — CMP (CANCER CENTER ONLY)
ALT: 45 U/L — ABNORMAL HIGH (ref 0–44)
AST: 31 U/L (ref 15–41)
Albumin: 4.3 g/dL (ref 3.5–5.0)
Alkaline Phosphatase: 146 U/L — ABNORMAL HIGH (ref 38–126)
Anion gap: 10 (ref 5–15)
BUN: 9 mg/dL (ref 8–23)
CO2: 23 mmol/L (ref 22–32)
Calcium: 9.6 mg/dL (ref 8.9–10.3)
Chloride: 107 mmol/L (ref 98–111)
Creatinine: 0.81 mg/dL (ref 0.44–1.00)
GFR, Estimated: >60 mL/min (ref >60)
Glucose, Bld: 94 mg/dL (ref 70–99)
Potassium: 4.5 mmol/L (ref 3.5–5.1)
Sodium: 140 mmol/L (ref 135–145)
Total Bilirubin: 0.4 mg/dL (ref 0.3–1.2)
Total Protein: 7.2 g/dL (ref 6.5–8.1)

## 2020-03-23 ENCOUNTER — Telehealth: Payer: Self-pay

## 2020-03-23 ENCOUNTER — Ambulatory Visit: Payer: Self-pay | Admitting: Radiation Oncology

## 2020-03-23 NOTE — Telephone Encounter (Signed)
I called the patient today about her upcoming follow-up appointment in radiation oncology.   Given the state of the COVID-19 pandemic, concerning case numbers in our community, and guidance from [facility], I offered a phone assessment with the patient to determine if coming to the clinic was necessary. She accepted.  I let the patient know that I had spoken with Dr. Isidore Moos, and she wanted them to know the importance of washing their hands for at least 20 seconds at a time, especially after going out in public, and before they eat.  Limit going out in public whenever possible. Do not touch your face, unless your hands are clean, such as when bathing. Get plenty of rest, eat well, and stay hydrated. Patient verbalized understanding and agreement.  The patient denies any symptomatic concerns other than lingering fatigue. She states the fatigue doesn't prevent her from doing daily activities/tasks, but she often has to rest or nap throughout the day to manage. She denies any residual swelling to the left breast. She reports occasional discomfort deep within her breast, but denies any tenderness with palpation. Denies any range of motion restrictions to her left shoulder/arm. Specifically, she reports good healing of her skin in the radiation fields.  Skin remains tanned, but is intact. She reports occasional peeling, but overall states her skin feels better since completing treatment. I recommended that she continue skin care by applying oil or lotion with vitamin E to the skin in the radiation fields, BID, for 2 more months.  Patient verbalized understanding and agreement  Continue follow-up with medical oncology - follow-up is scheduled on 04/12/2020 with Dr. Lurline Del.  I explained that yearly mammograms are important for patients with intact breast tissue, and physical exams are important after mastectomy for patients that cannot undergo mammography.  I encouraged her to call if she had  further questions or concerns about her healing. Otherwise, she will follow-up PRN in radiation oncology. Patient is pleased with this plan, and we will cancel her upcoming follow-up to reduce the risk of COVID-19 transmission.

## 2020-03-26 ENCOUNTER — Other Ambulatory Visit (HOSPITAL_COMMUNITY): Payer: Self-pay | Admitting: Family Medicine

## 2020-04-04 NOTE — Progress Notes (Signed)
  Patient Name: Kathy Barker MRN: [id number] DOB: 12/17/1960 Referring Physician: [name] (Profile Not Attached) Date of Service: 02/24/2020 [facility] [place], [place]                                                        End Of Treatment Note  Diagnoses: C50.312-Malignant neoplasm of lower-inner quadrant of left female breast  Cancer Staging: Cancer Staging Malignant neoplasm of lower-inner quadrant of left breast in female, estrogen receptor positive ([place]) Staging form: Breast, AJCC 8th Edition - Clinical stage from 06/08/2019: Stage IB (cT1c, cN0, cM0, G3, ER+, PR-, HER2-) - Unsigned Stage prefix: Initial diagnosis Histologic grading system: 3 grade system  ypT1a, ypN0   Intent: Curative  Radiation Treatment Dates: 01/24/2020 through 02/24/2020 Site Technique Total Dose (Gy) Dose per Fx (Gy) Completed Fx Beam Energies  Breast, Left: Breast_Lt 3D 40.05/40.05 2.67 15/15 6X, 10X  Breast, Left: Breast_Lt_Bst 3D 10/10 2 5/5 6X, 10X   Narrative: The patient tolerated radiation therapy relatively well.   Plan: The patient will follow-up with radiation oncology in 41mo, or as needed.  -----------------------------------  [name], MD

## 2020-04-12 ENCOUNTER — Inpatient Hospital Stay: Payer: 59

## 2020-04-12 ENCOUNTER — Other Ambulatory Visit: Payer: Self-pay

## 2020-04-12 ENCOUNTER — Inpatient Hospital Stay: Payer: 59 | Attending: Oncology | Admitting: Oncology

## 2020-04-12 VITALS — BP 141/84 | HR 109 | Temp 97.9°F | Resp 18 | Ht 62.0 in

## 2020-04-12 DIAGNOSIS — Z170 Estrogen receptor positive status [ER+]: Secondary | ICD-10-CM | POA: Diagnosis not present

## 2020-04-12 DIAGNOSIS — Z90721 Acquired absence of ovaries, unilateral: Secondary | ICD-10-CM | POA: Insufficient documentation

## 2020-04-12 DIAGNOSIS — R531 Weakness: Secondary | ICD-10-CM | POA: Diagnosis not present

## 2020-04-12 DIAGNOSIS — Z923 Personal history of irradiation: Secondary | ICD-10-CM | POA: Diagnosis not present

## 2020-04-12 DIAGNOSIS — R1013 Epigastric pain: Secondary | ICD-10-CM | POA: Insufficient documentation

## 2020-04-12 DIAGNOSIS — C50312 Malignant neoplasm of lower-inner quadrant of left female breast: Secondary | ICD-10-CM

## 2020-04-12 DIAGNOSIS — Z811 Family history of alcohol abuse and dependence: Secondary | ICD-10-CM | POA: Insufficient documentation

## 2020-04-12 DIAGNOSIS — Z8249 Family history of ischemic heart disease and other diseases of the circulatory system: Secondary | ICD-10-CM | POA: Diagnosis not present

## 2020-04-12 DIAGNOSIS — G629 Polyneuropathy, unspecified: Secondary | ICD-10-CM | POA: Diagnosis not present

## 2020-04-12 DIAGNOSIS — Z803 Family history of malignant neoplasm of breast: Secondary | ICD-10-CM | POA: Insufficient documentation

## 2020-04-12 DIAGNOSIS — R5383 Other fatigue: Secondary | ICD-10-CM | POA: Insufficient documentation

## 2020-04-12 DIAGNOSIS — Z79899 Other long term (current) drug therapy: Secondary | ICD-10-CM | POA: Diagnosis not present

## 2020-04-12 DIAGNOSIS — Z885 Allergy status to narcotic agent status: Secondary | ICD-10-CM | POA: Insufficient documentation

## 2020-04-12 LAB — CMP (CANCER CENTER ONLY)
ALT: 35 U/L (ref 0–44)
AST: 25 U/L (ref 15–41)
Albumin: 4 g/dL (ref 3.5–5.0)
Alkaline Phosphatase: 149 U/L — ABNORMAL HIGH (ref 38–126)
Anion gap: 11 (ref 5–15)
BUN: 13 mg/dL (ref 8–23)
CO2: 24 mmol/L (ref 22–32)
Calcium: 9 mg/dL (ref 8.9–10.3)
Chloride: 108 mmol/L (ref 98–111)
Creatinine: 0.77 mg/dL (ref 0.44–1.00)
GFR, Estimated: >60 mL/min (ref >60)
Glucose, Bld: 147 mg/dL — ABNORMAL HIGH (ref 70–99)
Potassium: 3.7 mmol/L (ref 3.5–5.1)
Sodium: 143 mmol/L (ref 135–145)
Total Bilirubin: 0.3 mg/dL (ref 0.3–1.2)
Total Protein: 6.8 g/dL (ref 6.5–8.1)

## 2020-04-12 LAB — CBC WITH DIFFERENTIAL (CANCER CENTER ONLY)
Abs Immature Granulocytes: 0.01 10*3/uL (ref 0.00–0.07)
Basophils Absolute: 0 10*3/uL (ref 0.0–0.1)
Basophils Relative: 0 %
Eosinophils Absolute: 0 10*3/uL (ref 0.0–0.5)
Eosinophils Relative: 1 %
HCT: 39.1 % (ref 36.0–46.0)
Hemoglobin: 13.3 g/dL (ref 12.0–15.0)
Immature Granulocytes: 0 %
Lymphocytes Relative: 25 %
Lymphs Abs: 1.2 10*3/uL (ref 0.7–4.0)
MCH: 31.1 pg (ref 26.0–34.0)
MCHC: 34 g/dL (ref 30.0–36.0)
MCV: 91.4 fL (ref 80.0–100.0)
Monocytes Absolute: 0.3 10*3/uL (ref 0.1–1.0)
Monocytes Relative: 7 %
Neutro Abs: 3.4 10*3/uL (ref 1.7–7.7)
Neutrophils Relative %: 67 %
Platelet Count: 201 10*3/uL (ref 150–400)
RBC: 4.28 MIL/uL (ref 3.87–5.11)
RDW: 12.6 % (ref 11.5–15.5)
WBC Count: 5 10*3/uL (ref 4.0–10.5)
nRBC: 0 % (ref 0.0–0.2)

## 2020-04-12 MED ORDER — GABAPENTIN 300 MG PO CAPS: 300.0000 mg | ORAL_CAPSULE | Freq: Every day | ORAL | 4 refills | Status: DC

## 2020-04-12 MED ORDER — METOCLOPRAMIDE HCL 10 MG PO TABS: 10.0000 mg | ORAL_TABLET | Freq: Every evening | ORAL | 6 refills | Status: DC | PRN

## 2020-04-12 NOTE — Progress Notes (Signed)
[place]  Telephone:([phone]) [phone] Fax:(336) [phone]     ID: Kathy Barker DOB: 27-Aug-1960  MR#: [id number]  KNL#:[id number]  Patient Care Team: [name], MD as PCP - General (Family Medicine) Rockwell Germany, RN as Oncology Nurse Navigator Mauro Kaufmann, RN as Oncology Nurse Navigator [name], MD as Consulting Physician (General Surgery) Thersa Mohiuddin, [name], MD as Consulting Physician (Oncology) [name], MD as Attending Physician (Radiation Oncology) Lavonna Monarch, MD as Consulting Physician (Dermatology) Chauncey Cruel, MD OTHER MD:  CHIEF COMPLAINT: Left-sided breast cancer  CURRENT TREATMENT: observation   INTERVAL HISTORY: Kathy Barker returns today for follow up of her left-sided breast cancer. She is now under observation.  Since her last visit, she completed radiation therapy on 02/24/2020.  She still feels fatigued.  She is taking walks and trying to exercise which is exactly the right thing to do.   REVIEW OF SYSTEMS: Kathy Barker feels hot all the time.  She is sleeping with the windows open and when is 32 degrees she says.  She has pain in her lower back radiation to her left buttock.  It does not go down the leg.  He does not seem to make much difference whether or not she walks but it does not keep her from walking.  She has epigastric pain.  This is worse at night.  She is sleeping with the back of the bed up a bit.  She is on omeprazole 40 mg daily which is not helping.  She also has a cough which is likely related to the reflux.  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  It is worse in the right hand than the left and her feet also are involved and she feels "clumsy".  A detailed review of systems was otherwise stable.   COVID 19 VACCINATION STATUS: She has had both immunizations, most recently April 2021   HISTORY OF CURRENT ILLNESS: From the original intake note:  Kathy Barker herself noted a palpable lower-inner left breast lump  and immediately brought it to medical attention.. She underwent bilateral diagnostic mammography with tomography and left breast ultrasonography at [facility] on 05/30/2019 showing: breast density category B; 1.7 cm lobulated mass in left breast at 8 o'clock; no significant left axillary abnormalities.  Accordingly on 06/01/2019 she proceeded to biopsy of the left breast area in question. The pathology from this procedure ([id number]) showed: invasive ductal carcinoma, grade 3. Prognostic indicators significant for: estrogen receptor, 70% positive with weak staining intensity and progesterone receptor, 0% negative. Proliferation marker Ki67 at 80%. HER2 negative by immunohistochemistry (1+).  The patient's subsequent history is as detailed below.   PAST MEDICAL HISTORY: Past Medical History:  Diagnosis Date  . Allergic rhinitis   . Anxiety    "only during my divorce" (11/18/2012)  . Cancer ([place])   . Depression    "only during my divorce" (11/18/2012)  . GERD (gastroesophageal reflux disease)   . High cholesterol   . Hot flashes   . IBS (irritable bowel syndrome)   . Insomnia   . Migraines    "lately more often; before SVT I'd get them ~ q 6 months" (11/18/2012)  . PONV (postoperative nausea and vomiting)   . 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 BREAST LUMPECTOMY WITH RADIOACTIVE SEED AND SENTINEL LYMPH NODE BIOPSY Left 12/14/2019   Procedure: LEFT  BREAST LUMPECTOMY X 2 WITH RADIOACTIVE SEED AND SENTINEL LYMPH NODE BIOPSY;  Surgeon: [name], MD;  Location: [facility];  Service: General;  Laterality: Left;  PEC BLOCK  . CESAREAN SECTION  1992  . COMBINED HYSTERECTOMY VAGINAL / OOPHORECTOMY / A&Barker REPAIR  1999  . IR IMAGING GUIDED PORT INSERTION  06/30/2019  . 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 SUPRAVENTRICULAR TACHYCARDIA ABLATION N/A 11/18/2012   Procedure: SUPRAVENTRICULAR TACHYCARDIA ABLATION;  Surgeon: Coralyn Mark, MD;  Location: [facility] CATH LAB;  Service: Cardiovascular;  Laterality: N/A;  . VAGINAL HYSTERECTOMY  ~ 2000    FAMILY HISTORY: Family History  Problem Relation Age of Onset  . Heart attack Father   . Alcohol abuse Father   . Heart attack Paternal Grandfather   . Heart Problems Other        both sides of family  . Breast cancer Mother    Her father died at age 59 from alcohol abuse. Her mother is living at age 81 (as of 05/2019) and has a history of DCIS at age 81 and invasive lobular breast cancer at age 34. Kathy Barker has one brother. She reports cancer of an unknown type in her maternal grandmother and prostate cancer in a maternal uncle.   GYNECOLOGIC HISTORY:  No LMP recorded. Patient has had a hysterectomy. Menarche: 60 years old Age at first live birth: 60 years old Kathy Barker 2 LMP 2000 Contraceptive: never used HRT never used  Hysterectomy? yes BSO? 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  She lost her job during her chemo treatments.  She is divorced. She lives at home with son Kathy Barker, age 40, who works in Biomedical scientist in [place]. Son Kathy Barker, age 10, works in Sales executive here in [place]. 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     ADVANCED DIRECTIVES: Not in place. She intends to name both of her sons as her 64.   HEALTH MAINTENANCE: Social History   Tobacco Use  . Smoking status: Never Smoker  . Smokeless tobacco: Never Used  Vaping Use  . Vaping Use: Never used  Substance Use Topics  . Alcohol use: Yes    Comment: 11/18/2012 "shot of brandy couple times/month"  . Drug use: No     Colonoscopy: yes, date unsure  PAP: date unsure  Bone density: never done   Allergies  Allergen Reactions  . Fentanyl Nausea And  Vomiting    Per patient " extreme nausea and vomiting "   . Other Other (See Comments)    Problems with stitches  . Simvastatin Other (See Comments)  . Venlafaxine Other (See Comments)  . Versed [Midazolam] Nausea And Vomiting    Per patient " extreme N/V"   . Latex Rash    Current Outpatient Medications  Medication Sig Dispense Refill  . acyclovir (ZOVIRAX) 400 MG tablet TAKE 2 TABLETS BY MOUTH 3 TIMES A DAY FOR 10 DAYS THEN DECREASE TO 1 TABLET 2 TIMES A DAY 120 tablet 3  . HYDROMET 5-1.5 MG/5ML syrup every 4 (four) hours as needed.    [name] omeprazole (PRILOSEC) 40 MG capsule Take one capsule daily for 5 days starting on chemo day, then daily as needed 60 capsule 3  . sertraline (ZOLOFT) 100 MG tablet Take 100 mg by mouth daily.    . temazepam (RESTORIL) 15 MG capsule Take  15 mg by mouth at bedtime.     No current facility-administered medications for this visit.    OBJECTIVE: White woman who appears stated age 34:   04/12/20 1430  BP: (!) 141/84  Pulse: (!) 109  Resp: 18  Temp: 97.9 F (36.6 C)  SpO2: 98%     Body mass index is 25.33 kg/m.   Wt Readings from Last 3 Encounters:  02/16/20 138 lb 8 oz (62.8 kg)  12/14/19 139 lb 12.4 oz (63.4 kg)  11/25/19 137 lb 14.4 oz (62.6 kg)      ECOG FS:1 - Symptomatic but completely ambulatory  Sclerae unicteric, EOMs intact Wearing a mask No cervical or supraclavicular adenopathy Lungs no rales or rhonchi Heart regular rate and rhythm Abd soft, nontender, positive bowel sounds MSK no focal spinal tenderness, no upper extremity lymphedema Neuro: nonfocal, well oriented, appropriate affect Breasts: The right breast is benign.  The left breast is status post lumpectomy and radiation.  The cosmetic result is excellent.  There is no evidence of local recurrence.  Both axillae are benign.   LAB RESULTS:  CMP     Component Value Date/Time   NA 143 04/12/2020 1404   K 3.7 04/12/2020 1404   CL 108 04/12/2020 1404   CO2 24  04/12/2020 1404   GLUCOSE 147 (H) 04/12/2020 1404   BUN 13 04/12/2020 1404   CREATININE 0.77 04/12/2020 1404   CALCIUM 9.0 04/12/2020 1404   PROT 6.8 04/12/2020 1404   ALBUMIN 4.0 04/12/2020 1404   AST 25 04/12/2020 1404   ALT 35 04/12/2020 1404   ALKPHOS 149 (H) 04/12/2020 1404   BILITOT 0.3 04/12/2020 1404   GFRNONAA >60 04/12/2020 1404   GFRAA >60 10/04/2019 1241    No results found for: TOTALPROTELP, ALBUMINELP, A1GS, A2GS, BETS, BETA2SER, GAMS, MSPIKE, SPEI  Lab Results  Component Value Date   WBC 5.0 04/12/2020   NEUTROABS 3.4 04/12/2020   HGB 13.3 04/12/2020   HCT 39.1 04/12/2020   MCV 91.4 04/12/2020   PLT 201 04/12/2020    No results found for: LABCA2  No components found for: KGYJEH631  No results for input(s): INR in the last 168 hours.  No results found for: LABCA2  No results found for: SHF026  No results found for: VZC588  No results found for: FOY774  No results found for: CA2729  No components found for: HGQUANT  No results found for: CEA1 / No results found for: CEA1   No results found for: AFPTUMOR  No results found for: CHROMOGRNA  No results found for: KPAFRELGTCHN, LAMBDASER, KAPLAMBRATIO (kappa/lambda light chains)  No results found for: HGBA, HGBA2QUANT, HGBFQUANT, HGBSQUAN (Hemoglobinopathy evaluation)   No results found for: LDH  No results found for: IRON, TIBC, IRONPCTSAT (Iron and TIBC)  No results found for: FERRITIN  Urinalysis No results found for: COLORURINE, APPEARANCEUR, LABSPEC, PHURINE, GLUCOSEU, HGBUR, BILIRUBINUR, KETONESUR, PROTEINUR, UROBILINOGEN, NITRITE, LEUKOCYTESUR   STUDIES: No results found.   ELIGIBLE FOR AVAILABLE RESEARCH PROTOCOL: no  ASSESSMENT: 60 y.o. Kathy Barker woman status post left breast lower inner quadrant biopsy 06/01/2019 for a clinical T1c N0, stage IB invasive ductal carcinoma, grade 3, weakly estrogen receptor positive but functionally triple negative, with an MIB-1 of 80%.  (1)  Oncotype score of 51 predicts a risk of recurrence outside the breast in the next 9 years of greater than 39% if the patient's only systemic therapy is antiestrogens for 5 years.  It also predicts a greater than 15% benefit from chemotherapy  (  2) neoadjuvant chemotherapy consisting of doxorubicin and cyclophosphamide in dose dense fashion x4 started 06/30/2019, completed 08/23/2019, followed by weekly paclitaxel and carboplatin weekly x12 starting 09/13/2019, discontinued after 10/04/2019 dose  (a) carboplatin/paclitaxel discontinued after 3 doses with neuropathy developing  (b) repeat breast MRI 10/24/2019 shows resolution of the known malignancy  (3) Left lumpectomy on 12/14/2019 shows residual 0.3 cm invasive ductal carcinoma, margins negative, 3 sentinel lymph nodes negative  (a) repeat prognostic panel triple negative with an Mib-1 of 65%  (4) adjuvant radiation 01/24/2020 through 02/24/2020 Site Technique Total Dose (Gy) Dose per Fx (Gy) Completed Fx Beam Energies  Breast, Left: Breast_Lt 3D 40.05/40.05 2.67 15/15 6X, 10X  Breast, Left: Breast_Lt_Bst 3D 10/10 2 5/5 6X, 10X     PLAN: Kathy Barker is 3 months out from definitive surgery and 6 weeks out from completion of radiation.  She is still in the recovery phase.  She is very frustrated and wishes she felt better already but she understands it will take some time and that a lot of the symptoms she is experiencing now will improved.  The reflux symptoms she is having are refractory to omeprazole which is unusual.  I am going to add metoclopramide at bedtime to see if we can improve peristalsis.  I am also going to add gabapentin at bedtime which should help falling asleep and should help with the night sweats she is experiencing.  I commended her exercise program.  The more she does that the less tired she will be.  I wrote for physical therapy which I think will help with both the energy issue rib and the range of motion and peripheral neuropathy  problems.  Just in case I am going to do a virtual visit with her in 9month.  Hopefully things will have improved significantly by then and if so then I would see her sometime in October.  She knows to call for any other issue that may develop before the next visit  Total encounter time 30 minutes.*Sarajane JewsC. Alasia Enge, MD 04/12/20 2:54 PM Medical Oncology and Hematology [facility] [id number]. [phone]   Fax. [phone]  I, KWilburn Mylar am acting as scribe for Dr. GVirgie Dad Cherlyn Syring.  I, GLurline DelMD, have reviewed the above documentation for accuracy and completeness, and I agree with the above.    *Total Encounter Time as defined by the Centers for Medicare and Medicaid Services includes, in addition to the face-to-face time of a patient visit (documented in the note above) non-face-to-face time: obtaining and reviewing outside history, ordering and reviewing medications, tests or procedures, care coordination (communications with other health care professionals or caregivers) and documentation in the medical record.

## 2020-04-16 ENCOUNTER — Telehealth: Payer: Self-pay | Admitting: Oncology

## 2020-04-16 NOTE — Telephone Encounter (Signed)
Scheduled appt per 3/31 los. Called pt, no answer. Left msg with appt date and time.

## 2020-04-24 ENCOUNTER — Ambulatory Visit: Payer: 59 | Attending: Oncology

## 2020-04-24 ENCOUNTER — Other Ambulatory Visit: Payer: Self-pay

## 2020-04-24 DIAGNOSIS — R293 Abnormal posture: Secondary | ICD-10-CM | POA: Insufficient documentation

## 2020-04-24 DIAGNOSIS — G620 Drug-induced polyneuropathy: Secondary | ICD-10-CM | POA: Diagnosis present

## 2020-04-24 DIAGNOSIS — R262 Difficulty in walking, not elsewhere classified: Secondary | ICD-10-CM

## 2020-04-24 DIAGNOSIS — C50312 Malignant neoplasm of lower-inner quadrant of left female breast: Secondary | ICD-10-CM | POA: Diagnosis present

## 2020-04-24 DIAGNOSIS — Z170 Estrogen receptor positive status [ER+]: Secondary | ICD-10-CM | POA: Diagnosis present

## 2020-04-24 DIAGNOSIS — M6281 Muscle weakness (generalized): Secondary | ICD-10-CM | POA: Insufficient documentation

## 2020-04-24 DIAGNOSIS — T451X5A Adverse effect of antineoplastic and immunosuppressive drugs, initial encounter: Secondary | ICD-10-CM | POA: Diagnosis present

## 2020-04-24 NOTE — Therapy (Signed)
[place], [place], [address] Phone: [phone]   Fax:  [phone]  Physical Therapy Treatment  Patient Details  Name: Kathy Barker MRN: [id number] Date of Birth: [birth year] Referring Provider (PT): Dr. Jana Hakim   Encounter Date: 04/24/2020   PT End of Session - 04/24/20 1504    Visit Number 2    Number of Visits 14    Date for PT Re-Evaluation 06/05/20    PT Start Time 1401    PT Stop Time 1455    PT Time Calculation (min) 54 min    Activity Tolerance Patient tolerated treatment well    Behavior During Therapy [facility] for tasks assessed/performed           Past Medical History:  Diagnosis Date  . Allergic rhinitis   . Anxiety    "only during my divorce" (11/18/2012)  . Cancer ([place])   . Depression    "only during my divorce" (11/18/2012)  . GERD (gastroesophageal reflux disease)   . High cholesterol   . Hot flashes   . IBS (irritable bowel syndrome)   . Insomnia   . Migraines    "lately more often; before SVT I'd get them ~ q 6 months" (11/18/2012)  . PONV (postoperative nausea and vomiting)   . 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 BREAST LUMPECTOMY WITH RADIOACTIVE SEED AND SENTINEL LYMPH NODE BIOPSY Left 12/14/2019   Procedure: LEFT BREAST LUMPECTOMY X 2 WITH RADIOACTIVE SEED AND SENTINEL LYMPH NODE BIOPSY;  Surgeon: [name], MD;  Location: [place];  Service: General;  Laterality: Left;  PEC BLOCK  . CESAREAN SECTION  1992  . COMBINED HYSTERECTOMY VAGINAL / OOPHORECTOMY / A&P REPAIR  1999  . IR IMAGING GUIDED PORT INSERTION  06/30/2019  . 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 SUPRAVENTRICULAR TACHYCARDIA ABLATION N/A 11/18/2012   Procedure: SUPRAVENTRICULAR TACHYCARDIA ABLATION;   Surgeon: Coralyn Mark, MD;  Location: [place] CATH LAB;  Service: Cardiovascular;  Laterality: N/A;  . VAGINAL HYSTERECTOMY  ~ 2000    There were no vitals filed for this visit.   Subjective Assessment - 04/24/20 1407    Subjective Pt started getting neuropathy in her finger tips right greater than left, and pain in both hands (had a thumb surgery on left that didn't work (12/20) and last 3 toes of her right foot and she stopped at her third session of chemo. She feels like her left arm gets very tired, and then she overworks the right side. Sometimes left breast feels like I need to hold it up, but I did finally get a bra now that feels good.  Feel like my balance is Off, and I drop things.  I get twisted up with my feet sometimes. She recently started gabapentin for her neuropathy.    Pertinent History Patient was diagnosed on 05/30/2019 with left grade III invasive ductal carcinoma breast cancer. It measures 1.3 cm and is located in the lower inner quadrant. It is ER positive, PR negative and HER2 negative with a Ki67 of 80%. She has a hx of cardiac arrythmias and an ablation.  Pt had left lumpectomy on 12/14/19.    How long can you sit comfortably? unlimited    How long can you stand comfortably? not very long and  need to be able to for work activities    How long can you walk comfortably? have to be careful; get winded if I walk too far    Patient Stated Goals neuropathy, left shoulder weakness    Currently in Pain? No/denies    Pain Score 0-No pain              [facility] PT Assessment - 04/24/20 0001      Assessment   Medical Diagnosis Left breast cancer    Referring Provider (PT) Dr. Jana Hakim    Onset Date/Surgical Date 05/30/19    Hand Dominance Right      Precautions   Precaution Comments lymphadema risk      Balance Screen   Has the patient fallen in the past 6 months No    Has the patient had a decrease in activity level because of a fear of falling?  No    Is the patient  reluctant to leave their home because of a fear of falling?  No      Home Environment   Living Environment Private residence    Living Arrangements Children   son     Prior Function   Level of Independence Independent    Vocation On disability    Leisure walking      Cognition   Overall Cognitive Status Within Functional Limits for tasks assessed      Posture/Postural Control   Posture/Postural Control Postural limitations    Postural Limitations Rounded Shoulders;Forward head      AROM   Left Shoulder Extension 46 Degrees    Left Shoulder Flexion 156 Degrees    Left Shoulder ABduction 142 Degrees    Left Shoulder Internal Rotation 72 Degrees    Left Shoulder External Rotation 88 Degrees      Strength   Right Hand Grip (lbs) 5.5    Left Hand Grip (lbs) 3    Right Hip Flexion 4/5    Right Hip External Rotation  4/5    Right Hip Internal Rotation 4+/5    Right Hip ABduction 4/5    Right Hip ADduction 4/5    Left Hip Flexion 4+/5    Left Hip External Rotation 4+/5    Left Hip Internal Rotation 4/5    Left Hip ABduction 4/5    Left Hip ADduction 4/5    Right Knee Flexion 4+/5    Right Knee Extension 4/5    Left Knee Flexion 4+/5    Left Knee Extension 4+/5    Right Ankle Dorsiflexion 5/5    Right Ankle Plantar Flexion 4+/5    Left Ankle Dorsiflexion 5/5    Left Ankle Plantar Flexion 4+/5      Transfers   Comments 30 sec sit to stand: 7 reps.      High Level Balance   High Level Balance Comments 4 position balance test; able to maintain all for 10 sec but needed hands to situate on 1 leg first . Legs shaky with partial tandem stance            LYMPHEDEMA/ONCOLOGY QUESTIONNAIRE - 04/24/20 0001      Surgeries   Lumpectomy Date 12/14/19      Treatment   Active Chemotherapy Treatment No    Past Chemotherapy Treatment Yes    Active Radiation Treatment No    Past Radiation Treatment Yes    Current Hormone Treatment No    Past Hormone Therapy No      What  other symptoms  do you have   Are you Having Heaviness or Tightness No    Are you having Pain --   right and left hand,   Are you having pitting edema No    Is it Hard or Difficult finding clothes that fit No    Do you have infections No    Is there Decreased scar mobility No      Right Upper Extremity Lymphedema   10 cm Proximal to Olecranon Process 27.3 cm    Olecranon Process 24.6 cm    10 cm Proximal to Ulnar Styloid Process 21.2 cm    Just Proximal to Ulnar Styloid Process 17.5 cm      Left Upper Extremity Lymphedema   10 cm Proximal to Olecranon Process 25.9 cm    Olecranon Process 23 cm    10 cm Proximal to Ulnar Styloid Process 19.8 cm    Just Proximal to Ulnar Styloid Process 16.3 cm              Quick Dash - 04/24/20 0001    Open a tight or new jar Severe difficulty    Do heavy household chores (wash walls, wash floors) Moderate difficulty    Carry a shopping bag or briefcase Moderate difficulty    Wash your back Moderate difficulty    Use a knife to cut food Moderate difficulty    Recreational activities in which you take some force or impact through your arm, shoulder, or hand (golf, hammering, tennis) Moderate difficulty    During the past week, to what extent has your arm, shoulder or hand problem interfered with your normal social activities with family, friends, neighbors, or groups? Quite a bit    During the past week, to what extent has your arm, shoulder or hand problem limited your work or other regular daily activities Quite a bit    Arm, shoulder, or hand pain. Moderate    Tingling (pins and needles) in your arm, shoulder, or hand Moderate    Difficulty Sleeping Moderate difficulty    DASH Score 56.82 %                          PT Education - 04/24/20 1502    Education Details pt was advised to get some playdoh or light putty and to do 10 repetitions of gentle squeezing, rolling pinching but not to overdo and cause increased pain.  She  was also educated in ice massage to right wrist for several minutes while moving to decrease swelling /pain.    Person(s) Educated Patient    Methods Explanation    Comprehension Verbalized understanding               PT Long Term Goals - 04/24/20 1720      PT LONG TERM GOAL #1   Title Patient will demonstrate she has regained full shoulder ROM and function post operatively compared to baselines.    Time 6    Period Weeks    Status New    Target Date 05/22/20      PT LONG TERM GOAL #2   Title Pt will be able to perform 10 sit to stands in 30 seconds for improved function    Time 6    Period Weeks    Status New    Target Date 06/05/20      PT LONG TERM GOAL #3   Title Pt will be able to maintain SLS on each leg  for greater than 20 seconds without LOB    Baseline 10    Time 6    Period Weeks    Status New    Target Date 06/05/20      PT LONG TERM GOAL #4   Title Pts bilateral grip strength will be improved to 20 lbs or greater Bilaterally    Baseline R 5.5,  left 3    Time 6    Period Weeks    Status New    Target Date 06/05/20      PT LONG TERM GOAL #5   Title Pt will be independent and compliant with HEP for bilateral UE and LE strength and balance    Time 6    Period Weeks    Status New    Target Date 06/05/20      Additional Long Term Goals   Additional Long Term Goals Yes      PT LONG TERM GOAL #6   Title Pt will report decreased symptoms of clumsiness by 50% or greater    Time 6    Period Weeks    Status New    Target Date 06/05/20                 Plan - 04/24/20 1505    Clinical Impression Statement Pt is now s/p left lumpectomy with SLNB as well as chemo and radiation.  Left shoulder ROM is mildly decreased actively for shoulder flexion but with greater decrease with shoulder abduction.  She is able to perform 7 sit to stands in 30 sec.  She can maintain 4 position balance test for 10 seconds although her legs feel fatigued and trembly  afterwards and she requires hands on counter to get to SLS initially.  bilateral grip strength is extremely weak with 5.5 lbs grip on right and 3 lbs on left.  She has had prior left thumb surgery that did not do well.  She has decreased UE and LE strength as well and will benefit from skilled PT to address strength, balance deficits and general functional deficits.    Personal Factors and Comorbidities Comorbidity 3+    Comorbidities Left lumpectomy s/p chemo and radiation, CIPN,    Examination-Activity Limitations Reach Overhead;Squat;Stand;Lift;Locomotion Level    Examination-Participation Restrictions Cleaning;Occupation    Stability/Clinical Decision Making Stable/Uncomplicated    Rehab Potential Excellent    PT Frequency 2x / week    PT Duration 6 weeks    PT Treatment/Interventions ADLs/Self Care Home Management;Gait training;Therapeutic activities;Therapeutic exercise;Balance training;Neuromuscular re-education;Manual techniques;Patient/family education;Passive range of motion    PT Next Visit Plan (all exs in gait belt)UE theraband SR, ext, ER in standing, functional strength training/mini squats/sit to stand etc, gait training , balance activities.  Will need to start with contact on bike or counter.  Fatigues easily    PT Home Exercise Plan light gripping exs with playdoh or light putty to increase grip strength (10 reps gentle)    Consulted and Agree with Plan of Care Patient           Patient will benefit from skilled therapeutic intervention in order to improve the following deficits and impairments:  Postural dysfunction,Decreased range of motion,Impaired UE functional use,Decreased knowledge of precautions,Decreased activity tolerance,Decreased balance,Decreased strength,Impaired sensation,Pain,Difficulty walking  Visit Diagnosis: Malignant neoplasm of lower-inner quadrant of left breast in female, estrogen receptor positive (HCC)  Chemotherapy-induced peripheral neuropathy  (HCC)  Muscle weakness (generalized)  Difficulty in walking, not elsewhere classified  Abnormal posture  Problem List Patient Active Problem List   Diagnosis Date Noted  . Port-A-Cath in place 06/30/2019  . Malignant neoplasm of lower-inner quadrant of left breast in female, estrogen receptor positive ([place]) 06/06/2019  . SVT (supraventricular tachycardia) ([place]) 10/28/2012  . Other specified cardiac dysrhythmias(427.89) 10/26/2012  . Anxiety   . Allergic rhinitis   . Insomnia   . Hot flashes   . GERD (gastroesophageal reflux disease)   . Depression   . IBS (irritable bowel syndrome)     Claris Pong 04/24/2020, 5:26 PM  [place], [place], [address] Phone: [phone]   Fax:  [phone]  Name: Aamilah Augenstein MRN: [id number] Date of Birth: 10-27-1960 Cheral Almas, PT 04/24/20 5:29 PM

## 2020-04-26 ENCOUNTER — Encounter: Payer: Self-pay | Admitting: Oncology

## 2020-04-27 ENCOUNTER — Other Ambulatory Visit: Payer: Self-pay | Admitting: *Deleted

## 2020-04-27 MED ORDER — OMEPRAZOLE 40 MG PO CPDR: 40.0000 mg | DELAYED_RELEASE_CAPSULE | Freq: Every day | ORAL | 3 refills | Status: DC

## 2020-04-27 MED ORDER — METOCLOPRAMIDE HCL 10 MG PO TABS: 10.0000 mg | ORAL_TABLET | Freq: Three times a day (TID) | ORAL | 3 refills | Status: DC

## 2020-05-09 ENCOUNTER — Other Ambulatory Visit: Payer: Self-pay

## 2020-05-09 ENCOUNTER — Ambulatory Visit: Payer: 59

## 2020-05-09 DIAGNOSIS — C50312 Malignant neoplasm of lower-inner quadrant of left female breast: Secondary | ICD-10-CM | POA: Diagnosis not present

## 2020-05-09 DIAGNOSIS — G620 Drug-induced polyneuropathy: Secondary | ICD-10-CM

## 2020-05-09 DIAGNOSIS — T451X5A Adverse effect of antineoplastic and immunosuppressive drugs, initial encounter: Secondary | ICD-10-CM

## 2020-05-09 DIAGNOSIS — R293 Abnormal posture: Secondary | ICD-10-CM

## 2020-05-09 DIAGNOSIS — R262 Difficulty in walking, not elsewhere classified: Secondary | ICD-10-CM

## 2020-05-09 DIAGNOSIS — M6281 Muscle weakness (generalized): Secondary | ICD-10-CM

## 2020-05-09 DIAGNOSIS — Z170 Estrogen receptor positive status [ER+]: Secondary | ICD-10-CM

## 2020-05-09 NOTE — Therapy (Signed)
[place], [place], [address] Phone: [phone]   Fax:  [phone]  Physical Therapy Treatment  Patient Details  Name: Kathy Barker MRN: [id number] Date of Birth: 07-31-1960 Referring Provider (PT): Dr. Jana Hakim   Encounter Date: 05/09/2020   PT End of Session - 05/09/20 1510    Visit Number 3    Number of Visits 14    Date for PT Re-Evaluation 06/05/20    PT Start Time 1409    PT Stop Time 1453    PT Time Calculation (min) 44 min    Equipment Utilized During Treatment Gait belt    Activity Tolerance Patient tolerated treatment well    Behavior During Therapy [facility] for tasks assessed/performed           Past Medical History:  Diagnosis Date  . Allergic rhinitis   . Anxiety    "only during my divorce" (11/18/2012)  . Cancer ([employer])   . Depression    "only during my divorce" (11/18/2012)  . GERD (gastroesophageal reflux disease)   . High cholesterol   . Hot flashes   . IBS (irritable bowel syndrome)   . Insomnia   . Migraines    "lately more often; before SVT I'd get them ~ q 6 months" (11/18/2012)  . PONV (postoperative nausea and vomiting)   . 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 BREAST LUMPECTOMY WITH RADIOACTIVE SEED AND SENTINEL LYMPH NODE BIOPSY Left 12/14/2019   Procedure: LEFT BREAST LUMPECTOMY X 2 WITH RADIOACTIVE SEED AND SENTINEL LYMPH NODE BIOPSY;  Surgeon: [name], MD;  Location: [place];  Service: General;  Laterality: Left;  PEC BLOCK  . CESAREAN SECTION  1992  . COMBINED HYSTERECTOMY VAGINAL / OOPHORECTOMY / A&P REPAIR  1999  . IR IMAGING GUIDED PORT INSERTION  06/30/2019  . 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 SUPRAVENTRICULAR TACHYCARDIA ABLATION N/A 11/18/2012    Procedure: SUPRAVENTRICULAR TACHYCARDIA ABLATION;  Surgeon: Coralyn Mark, MD;  Location: [place] CATH LAB;  Service: Cardiovascular;  Laterality: N/A;  . VAGINAL HYSTERECTOMY  ~ 2000    There were no vitals filed for this visit.   Subjective Assessment - 05/09/20 1410    Subjective Have been squeezing a light Jelly fish and my strength does seem to be getting better. My balance feels better when I don't have my shoes on. I don't notice the feeling of having something between my toes as much without my shoes.  Some days I walk better than others    Pertinent History Patient was diagnosed on 05/30/2019 with left grade III invasive ductal carcinoma breast cancer. It measures 1.3 cm and is located in the lower inner quadrant. It is ER positive, PR negative and HER2 negative with a Ki67 of 80%. She has a hx of cardiac arrythmias and an ablation.  Pt had left lumpectomy on 12/14/19.    How long can you sit comfortably? unlimited    How long can you stand comfortably? not very long and need to be able to for work activities    How long can you walk comfortably? have to be careful; get winded if I walk too far    Patient Stated Goals neuropathy, left shoulder weakness    Currently in Pain? No/denies    Pain  Score 0-No pain                             [facility] Adult PT Treatment/Exercise - 05/09/20 0001      Knee/Hip Exercises: Standing   Heel Raises 20 reps   10 with out holding 10 with holding   Lateral Step Up Right;10 reps   partial without UE hold, 4 in   Forward Step Up Right;Left;10 reps   without hands;partial step up, 4 in   Functional Squat 2 sets;10 reps   mini squats holding bike   Functional Squat Limitations VC's for knees behind toes    SLS bottle touches on chair/ rolling cart 10 ea bilaterally    SLS with Vectors 5 ea with toe touch    Gait Training step and hold on ax a 10 B for and lateral   neara bike for hand hold prn,   Other Standing Knee Exercises marching on ax    next to bike, 10 with hold, 15 without hold.  Pt holding gait belt     Shoulder Exercises: Standing   External Rotation Strengthening;Both;10 reps    Theraband Level (Shoulder External Rotation) Level 1 (Yellow)    Extension Strengthening;Both;15 reps    Theraband Level (Shoulder Extension) Level 1 (Yellow)    Retraction Strengthening;Both;15 reps    Theraband Level (Shoulder Retraction) Level 1 (Yellow)      Ankle Exercises: Standing   Other Standing Ankle Exercises ax eyes open/eyes closed, feet together   behind bike , PT holding gait belt                 PT Education - 05/09/20 1509    Education Details Pt was educated in postural theraband exs with yellow band x 10, standing heel raises, SLS, marching for balance.    Person(s) Educated Patient    Methods Demonstration;Handout    Comprehension Returned demonstration               PT Long Term Goals - 04/24/20 1720      PT LONG TERM GOAL #1   Title Patient will demonstrate she has regained full shoulder ROM and function post operatively compared to baselines.    Time 6    Period Weeks    Status New    Target Date 05/22/20      PT LONG TERM GOAL #2   Title Pt will be able to perform 10 sit to stands in 30 seconds for improved function    Time 6    Period Weeks    Status New    Target Date 06/05/20      PT LONG TERM GOAL #3   Title Pt will be able to maintain SLS on each leg for greater than 20 seconds without LOB    Baseline 10    Time 6    Period Weeks    Status New    Target Date 06/05/20      PT LONG TERM GOAL #4   Title Pts bilateral grip strength will be improved to 20 lbs or greater Bilaterally    Baseline R 5.5,  left 3    Time 6    Period Weeks    Status New    Target Date 06/05/20      PT LONG TERM GOAL #5   Title Pt will be independent and compliant with HEP for bilateral UE and LE strength and balance  Time 6    Period Weeks    Status New    Target Date 06/05/20       Additional Long Term Goals   Additional Long Term Goals Yes      PT LONG TERM GOAL #6   Title Pt will report decreased symptoms of clumsiness by 50% or greater    Time 6    Period Weeks    Status New    Target Date 06/05/20                 Plan - 05/09/20 1520    Clinical Impression Statement Therapy concisted of UE and lower extremity strengthening and balance activities.  Pt in gait belt for balance activities and also in proximity to bike for hand hold.  Pt did very well with all activities.  She used good concentration for balance activities.  She did experience several LOB with each activity but had good control for recovery of balance. She feels her hand strength has improved some by squeezing her jelly fish and she is trying not to overdo it so as not to cause pain.  She did have mild complaints of right knee pain after balance activities but notes that this is not new for her.    Personal Factors and Comorbidities Comorbidity 3+    Comorbidities Left lumpectomy s/p chemo and radiation, CIPN,    Examination-Activity Limitations Reach Overhead;Squat;Stand;Lift;Locomotion Level    Examination-Participation Restrictions Cleaning;Occupation    Stability/Clinical Decision Making Stable/Uncomplicated    Rehab Potential Excellent    PT Frequency 2x / week    PT Duration 6 weeks    PT Treatment/Interventions ADLs/Self Care Home Management;Gait training;Therapeutic activities;Therapeutic exercise;Balance training;Neuromuscular re-education;Manual techniques;Patient/family education;Passive range of motion    PT Next Visit Plan (all exs in gait belt)UE theraband SR, ext, ER in standing, functional strength training/mini squats/sit to stand etc, gait training , balance activities.  Will need to start with contact on bike or counter.  Fatigues easily    PT Home Exercise Plan light gripping exs with playdoh or light putty to increase grip strength (10 reps gentle), standing TB retraction,  ext, ER x 10 yellow, standing heel raises, marching, SLS    Consulted and Agree with Plan of Care Patient           Patient will benefit from skilled therapeutic intervention in order to improve the following deficits and impairments:  Postural dysfunction,Decreased range of motion,Impaired UE functional use,Decreased knowledge of precautions,Decreased activity tolerance,Decreased balance,Decreased strength,Impaired sensation,Pain,Difficulty walking  Visit Diagnosis: Chemotherapy-induced peripheral neuropathy (HCC)  Muscle weakness (generalized)  Difficulty in walking, not elsewhere classified  Abnormal posture  Malignant neoplasm of lower-inner quadrant of left breast in female, estrogen receptor positive ([place])     Problem List Patient Active Problem List   Diagnosis Date Noted  . Port-A-Cath in place 06/30/2019  . Malignant neoplasm of lower-inner quadrant of left breast in female, estrogen receptor positive ([place]) 06/06/2019  . SVT (supraventricular tachycardia) ([place]) 10/28/2012  . Other specified cardiac dysrhythmias(427.89) 10/26/2012  . Anxiety   . Allergic rhinitis   . Insomnia   . Hot flashes   . GERD (gastroesophageal reflux disease)   . Depression   . IBS (irritable bowel syndrome)     Claris Pong 05/09/2020, 3:27 PM  [place], [place], [address] Phone: [phone]   Fax:  [phone]  Name: Kathy Barker MRN: [id number] Date of Birth: Sep 21, 1960  Cheral Almas,  PT 05/09/20 3:29 PM

## 2020-05-09 NOTE — Patient Instructions (Signed)
Access Code: [email or web address] URL: [email or web address] Date: 05/09/2020 Prepared by: Cheral Almas  Exercises Scapular Retraction with Resistance - 1 x daily - 7 x weekly - 1 sets - 10 reps Scapular Retraction with Resistance Advanced - 1 x daily - 7 x weekly - 1 sets - 10 reps Shoulder External Rotation and Scapular Retraction with Resistance - 1 x daily - 7 x weekly - 1 sets - 10 reps Single Leg Balance in March Position - 1 x daily - 7 x weekly - 1 sets - 10 reps Alternating Single Leg Balance - Foot Behind - 1 x daily - 7 x weekly - 1 sets - 10 reps Standing Heel Raise - 1 x daily - 7 x weekly - 1 sets - 10 reps

## 2020-05-10 ENCOUNTER — Ambulatory Visit: Payer: 59 | Admitting: Rehabilitation

## 2020-05-10 ENCOUNTER — Encounter: Payer: Self-pay | Admitting: Rehabilitation

## 2020-05-10 DIAGNOSIS — T451X5A Adverse effect of antineoplastic and immunosuppressive drugs, initial encounter: Secondary | ICD-10-CM

## 2020-05-10 DIAGNOSIS — Z170 Estrogen receptor positive status [ER+]: Secondary | ICD-10-CM

## 2020-05-10 DIAGNOSIS — G620 Drug-induced polyneuropathy: Secondary | ICD-10-CM

## 2020-05-10 DIAGNOSIS — M6281 Muscle weakness (generalized): Secondary | ICD-10-CM

## 2020-05-10 DIAGNOSIS — R262 Difficulty in walking, not elsewhere classified: Secondary | ICD-10-CM

## 2020-05-10 DIAGNOSIS — C50312 Malignant neoplasm of lower-inner quadrant of left female breast: Secondary | ICD-10-CM | POA: Diagnosis not present

## 2020-05-10 DIAGNOSIS — R293 Abnormal posture: Secondary | ICD-10-CM

## 2020-05-10 NOTE — Therapy (Signed)
[place], [place], [address] Phone: [phone]   Fax:  [phone]  Physical Therapy Treatment  Patient Details  Name: Kathy Barker MRN: [id number] Date of Birth: Nov 14, 1960 Referring Provider (PT): Dr. Jana Hakim   Encounter Date: 05/10/2020   PT End of Session - 05/10/20 1450    Visit Number 4    Number of Visits 14    PT Start Time 1400    PT Stop Time 1446    PT Time Calculation (min) 46 min    Activity Tolerance Patient tolerated treatment well    Behavior During Therapy [facility] for tasks assessed/performed           Past Medical History:  Diagnosis Date  . Allergic rhinitis   . Anxiety    "only during my divorce" (11/18/2012)  . Cancer ([place])   . Depression    "only during my divorce" (11/18/2012)  . GERD (gastroesophageal reflux disease)   . High cholesterol   . Hot flashes   . IBS (irritable bowel syndrome)   . Insomnia   . Migraines    "lately more often; before SVT I'd get them ~ q 6 months" (11/18/2012)  . PONV (postoperative nausea and vomiting)   . 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 BREAST LUMPECTOMY WITH RADIOACTIVE SEED AND SENTINEL LYMPH NODE BIOPSY Left 12/14/2019   Procedure: LEFT BREAST LUMPECTOMY X 2 WITH RADIOACTIVE SEED AND SENTINEL LYMPH NODE BIOPSY;  Surgeon: [name], MD;  Location: [place];  Service: General;  Laterality: Left;  PEC BLOCK  . CESAREAN SECTION  1992  . COMBINED HYSTERECTOMY VAGINAL / OOPHORECTOMY / A&P REPAIR  1999  . IR IMAGING GUIDED PORT INSERTION  06/30/2019  . 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 SUPRAVENTRICULAR TACHYCARDIA ABLATION N/A 11/18/2012   Procedure: SUPRAVENTRICULAR TACHYCARDIA ABLATION;  Surgeon: Coralyn Mark, MD;   Location: [place] [facility];  Service: Cardiovascular;  Laterality: N/A;  . VAGINAL HYSTERECTOMY  ~ 2000    There were no vitals filed for this visit.   Subjective Assessment - 05/10/20 1400    Subjective Some Rt knee soreness and hands    Pertinent History Patient was diagnosed on 05/30/2019 with left grade III invasive ductal carcinoma breast cancer. It measures 1.3 cm and is located in the lower inner quadrant. It is ER positive, PR negative and HER2 negative with a Ki67 of 80%. She has a hx of cardiac arrythmias and an ablation.  Pt had left lumpectomy on 12/14/19.    Currently in Pain? Yes    Pain Score 6     Pain Location Knee    Pain Orientation Right    Pain Descriptors / Indicators Aching    Pain Type Chronic pain    Pain Onset More than a month ago    Pain Frequency Intermittent                             [facility] Adult PT Treatment/Exercise - 05/10/20 0001      Neuro Re-ed    Neuro Re-ed Details  at back of bike: rocker board forward and lateral CGA with no LOB and tandem stance on air ex 2x30" bil.  avoided SL stance due to knee  pain today      Exercises   Exercises Other Exercises;Knee/Hip    Other Exercises  standing on airex in front of mirror bicep curls 2# x 10 both, overhead press 2# x 10 bil, mini squats x 10      Knee/Hip Exercises: Seated   Ball Squeeze 4" x 10    Clamshell with TheraBand Red   x10   Marching Both;1 set;5 reps    Marching Limitations seated on large green ball alternating UE/LE with gait belt hold; no LOB      Shoulder Exercises: Supine   Horizontal ABduction Both;5 reps    Theraband Level (Shoulder Horizontal ABduction) Level 1 (Yellow)    Diagonals Both;5 reps    Theraband Level (Shoulder Diagonals) Level 1 (Yellow)    Other Supine Exercises extension mat press 6" x 10      Shoulder Exercises: Standing   External Rotation Strengthening;Both;10 reps    Theraband Level (Shoulder External Rotation) Level 1 (Yellow)    Extension  Strengthening;Both;15 reps    Theraband Level (Shoulder Extension) Level 1 (Yellow)    Retraction Strengthening;Both;15 reps    Theraband Level (Shoulder Retraction) Level 1 (Yellow)                       PT Long Term Goals - 04/24/20 1720      PT LONG TERM GOAL #1   Title Patient will demonstrate she has regained full shoulder ROM and function post operatively compared to baselines.    Time 6    Period Weeks    Status New    Target Date 05/22/20      PT LONG TERM GOAL #2   Title Pt will be able to perform 10 sit to stands in 30 seconds for improved function    Time 6    Period Weeks    Status New    Target Date 06/05/20      PT LONG TERM GOAL #3   Title Pt will be able to maintain SLS on each leg for greater than 20 seconds without LOB    Baseline 10    Time 6    Period Weeks    Status New    Target Date 06/05/20      PT LONG TERM GOAL #4   Title Pts bilateral grip strength will be improved to 20 lbs or greater Bilaterally    Baseline R 5.5,  left 3    Time 6    Period Weeks    Status New    Target Date 06/05/20      PT LONG TERM GOAL #5   Title Pt will be independent and compliant with HEP for bilateral UE and LE strength and balance    Time 6    Period Weeks    Status New    Target Date 06/05/20      Additional Long Term Goals   Additional Long Term Goals Yes      PT LONG TERM GOAL #6   Title Pt will report decreased symptoms of clumsiness by 50% or greater    Time 6    Period Weeks    Status New    Target Date 06/05/20                 Plan - 05/10/20 1450    Clinical Impression Statement Pt with increased Rt knee pain after visit yesterday so omitted most single leg balance work as well as repeated  step ups.  Added more UE TE in addition to balance on airex.  Pt tolerated all well without increase in knee pain    PT Frequency 2x / week    PT Duration 6 weeks    PT Treatment/Interventions ADLs/Self Care Home Management;Gait  training;Therapeutic activities;Therapeutic exercise;Balance training;Neuromuscular re-education;Manual techniques;Patient/family education;Passive range of motion    PT Next Visit Plan (all exs in gait belt)UE theraband, functional strength training/mini squats/sit to stand etc, gait training , balance activities.  Will need to start with contact on bike or counter.  Fatigues easily    Consulted and Agree with Plan of Care Patient           Patient will benefit from skilled therapeutic intervention in order to improve the following deficits and impairments:     Visit Diagnosis: Chemotherapy-induced peripheral neuropathy (HCC)  Muscle weakness (generalized)  Difficulty in walking, not elsewhere classified  Abnormal posture  Malignant neoplasm of lower-inner quadrant of left breast in female, estrogen receptor positive ([place])     Problem List Patient Active Problem List   Diagnosis Date Noted  . Port-A-Cath in place 06/30/2019  . Malignant neoplasm of lower-inner quadrant of left breast in female, estrogen receptor positive ([place]) 06/06/2019  . SVT (supraventricular tachycardia) ([place]) 10/28/2012  . Other specified cardiac dysrhythmias(427.89) 10/26/2012  . Anxiety   . Allergic rhinitis   . Insomnia   . Hot flashes   . GERD (gastroesophageal reflux disease)   . Depression   . IBS (irritable bowel syndrome)     Stark Bray 05/10/2020, 2:52 PM  [place], [place], [address] Phone: [phone]   Fax:  [phone]  Name: Cortana Vanderford MRN: [id number] Date of Birth: 1960-07-20

## 2020-05-15 ENCOUNTER — Other Ambulatory Visit: Payer: Self-pay

## 2020-05-15 ENCOUNTER — Ambulatory Visit: Payer: 59 | Attending: Oncology

## 2020-05-15 DIAGNOSIS — G620 Drug-induced polyneuropathy: Secondary | ICD-10-CM | POA: Diagnosis present

## 2020-05-15 DIAGNOSIS — T451X5A Adverse effect of antineoplastic and immunosuppressive drugs, initial encounter: Secondary | ICD-10-CM | POA: Diagnosis present

## 2020-05-15 DIAGNOSIS — R262 Difficulty in walking, not elsewhere classified: Secondary | ICD-10-CM | POA: Insufficient documentation

## 2020-05-15 DIAGNOSIS — R293 Abnormal posture: Secondary | ICD-10-CM | POA: Diagnosis present

## 2020-05-15 DIAGNOSIS — M6281 Muscle weakness (generalized): Secondary | ICD-10-CM | POA: Insufficient documentation

## 2020-05-15 DIAGNOSIS — Z170 Estrogen receptor positive status [ER+]: Secondary | ICD-10-CM | POA: Insufficient documentation

## 2020-05-15 DIAGNOSIS — C50312 Malignant neoplasm of lower-inner quadrant of left female breast: Secondary | ICD-10-CM | POA: Diagnosis present

## 2020-05-15 NOTE — Therapy (Signed)
[place], [place], [address] Phone: [phone]   Fax:  [phone]  Physical Therapy Treatment  Patient Details  Name: Kathy Barker MRN: [id number] Date of Birth: [birth year] Referring Provider (PT): Dr. Jana Hakim   Encounter Date: 05/15/2020   PT End of Session - 05/15/20 1603    Visit Number 5    Number of Visits 14    Date for PT Re-Evaluation 06/05/20    PT Start Time 2353    PT Stop Time 1450    PT Time Calculation (min) 47 min    Equipment Utilized During Treatment Gait belt    Activity Tolerance Patient tolerated treatment well    Behavior During Therapy Kathy Barker for tasks assessed/performed           Past Medical History:  Diagnosis Date  . Allergic rhinitis   . Anxiety    "only during my divorce" (11/18/2012)  . Cancer ([place])   . Depression    "only during my divorce" (11/18/2012)  . GERD (gastroesophageal reflux disease)   . High cholesterol   . Hot flashes   . IBS (irritable bowel syndrome)   . Insomnia   . Migraines    "lately more often; before SVT I'd get them ~ q 6 months" (11/18/2012)  . PONV (postoperative nausea and vomiting)   . 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 BREAST LUMPECTOMY WITH RADIOACTIVE SEED AND SENTINEL LYMPH NODE BIOPSY Left 12/14/2019   Procedure: LEFT BREAST LUMPECTOMY X 2 WITH RADIOACTIVE SEED AND SENTINEL LYMPH NODE BIOPSY;  Surgeon: [name], MD;  Location: [place];  Service: General;  Laterality: Left;  PEC BLOCK  . CESAREAN SECTION  1992  . COMBINED HYSTERECTOMY VAGINAL / OOPHORECTOMY / A&P REPAIR  1999  . IR IMAGING GUIDED PORT INSERTION  06/30/2019  . 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 SUPRAVENTRICULAR TACHYCARDIA ABLATION N/A 11/18/2012    Procedure: SUPRAVENTRICULAR TACHYCARDIA ABLATION;  Surgeon: Coralyn Mark, MD;  Location: [place] C[facility];  Service: Cardiovascular;  Laterality: N/A;  . VAGINAL HYSTERECTOMY  ~ 2000    There were no vitals filed for this visit.   Subjective Assessment - 05/15/20 1402    Subjective Knee still hurts and my hands hurt so bad. Right knee pain 5/10 presently.  I have been icing it and trying to take care of it. Last visit  wasn' too uncomfortable since we modified it. Knee starts to lock at times like it will get stuck.    Pertinent History Patient was diagnosed on 05/30/2019 with left grade III invasive ductal carcinoma breast cancer. It measures 1.3 cm and is located in the lower inner quadrant. It is ER positive, PR negative and HER2 negative with a Ki67 of 80%. She has a hx of cardiac arrythmias and an ablation.  Pt had left lumpectomy on 12/14/19.    How long can you sit comfortably? unlimited    How long can you stand comfortably? not very long and need to be able to for work activities    How long can you walk comfortably? have to be careful; get winded if I walk too far    Currently in Pain? Yes    Pain Score 5     Pain Location Knee    Pain  Orientation Right    Pain Descriptors / Indicators Aching    Pain Type Chronic pain                             [facility] Adult PT Treatment/Exercise - 05/15/20 0001      Neuro Re-ed    Neuro Re-ed Details  at back of bike: rocker board forward and lateral CGA with no LOB and tandem stance on air ex 2x30" bil.  avoided SL stance due to knee pain today      Exercises   Other Exercises  standing on airex bicep curls 2#2  x 10 both, overhead press 2# 2 x 10 bil,      Knee/Hip Exercises: Standing   Heel Raises 1 set;20 reps   on ax, no hands   Wall Squat 1 set;10 reps   5 sec hold   SLS bottle touches on chair 2 x 15 left/ held by PT 2 X 10    Other Standing Knee Exercises marching on ax   next to bike, 15 without hold.  PT holding gait  belt     Knee/Hip Exercises: Seated   Ball Squeeze 10 x 5 sec purple ball squeeze      Shoulder Exercises: Seated   Extension Strengthening;Both;10 reps    Theraband Level (Shoulder Extension) Level 1 (Yellow)   sitting and bouncing on green ball   Retraction Strengthening;Both;10 reps   sitting and bouncing on green ball   Theraband Level (Shoulder Retraction) Level 1 (Yellow)    Horizontal ABduction Strengthening;Both;10 reps    Theraband Level (Shoulder Horizontal ABduction) Level 1 (Yellow)    Flexion AROM;Left;10 reps;Right   sitting on ball and bouncing                 PT Education - 05/15/20 1602    Education Details pt was educated to call MD and set up appt for her knee and fingers which have been bothering her for a long time    Person(s) Educated Patient    Methods Explanation    Comprehension Verbalized understanding               PT Long Term Goals - 04/24/20 1720      PT LONG TERM GOAL #1   Title Patient will demonstrate she has regained full shoulder ROM and function post operatively compared to baselines.    Time 6    Period Weeks    Status New    Target Date 05/22/20      PT LONG TERM GOAL #2   Title Pt will be able to perform 10 sit to stands in 30 seconds for improved function    Time 6    Period Weeks    Status New    Target Date 06/05/20      PT LONG TERM GOAL #3   Title Pt will be able to maintain SLS on each leg for greater than 20 seconds without LOB    Baseline 10    Time 6    Period Weeks    Status New    Target Date 06/05/20      PT LONG TERM GOAL #4   Title Pts bilateral grip strength will be improved to 20 lbs or greater Bilaterally    Baseline R 5.5,  left 3    Time 6    Period Weeks    Status New    Target Date 06/05/20  PT LONG TERM GOAL #5   Title Pt will be independent and compliant with HEP for bilateral UE and LE strength and balance    Time 6    Period Weeks    Status New    Target Date 06/05/20       Additional Long Term Goals   Additional Long Term Goals Yes      PT LONG TERM GOAL #6   Title Pt will report decreased symptoms of clumsiness by 50% or greater    Time 6    Period Weeks    Status New    Target Date 06/05/20                 Plan - 05/15/20 1603    Clinical Impression Statement Continued mostly bilateral LE activities today in standing and balance activities sitting on the ball but did 1 SLS activity which did not seem to bother her right knee at all.  Right leg very trembly today when doing small range wall slides. She did well with balance activities with only occasional LOB.  Discussed pt seeing an orthopedist for her knee and hands which have bothered her for a long time.    Personal Factors and Comorbidities Comorbidity 3+    Comorbidities Left lumpectomy s/p chemo and radiation, CIPN,    Examination-Activity Limitations Reach Overhead;Squat;Stand;Lift;Locomotion Level    Examination-Participation Restrictions Cleaning;Occupation    Stability/Clinical Decision Making Stable/Uncomplicated    Rehab Potential Excellent    PT Frequency 2x / week    PT Duration 6 weeks    PT Treatment/Interventions ADLs/Self Care Home Management;Gait training;Therapeutic activities;Therapeutic exercise;Balance training;Neuromuscular re-education;Manual techniques;Patient/family education;Passive range of motion    PT Next Visit Plan (all exs in gait belt)UE theraband, functional strength training/mini squats/sit to stand etc, gait training , balance activities.  Will need to start with contact on bike or counter.  Fatigues easily    PT Home Exercise Plan light gripping exs with playdoh or light putty to increase grip strength (10 reps gentle), standing TB retraction, ext, ER x 10 yellow, standing heel raises, marching, SLS    Consulted and Agree with Plan of Care Patient           Patient will benefit from skilled therapeutic intervention in order to improve the following deficits  and impairments:  Postural dysfunction,Decreased range of motion,Impaired UE functional use,Decreased knowledge of precautions,Decreased activity tolerance,Decreased balance,Decreased strength,Impaired sensation,Pain,Difficulty walking  Visit Diagnosis: Chemotherapy-induced peripheral neuropathy (HCC)  Muscle weakness (generalized)  Difficulty in walking, not elsewhere classified  Abnormal posture  Malignant neoplasm of lower-inner quadrant of left breast in female, estrogen receptor positive ([place])     Problem List Patient Active Problem List   Diagnosis Date Noted  . Port-A-Cath in place 06/30/2019  . Malignant neoplasm of lower-inner quadrant of left breast in female, estrogen receptor positive ([place]) 06/06/2019  . SVT (supraventricular tachycardia) ([place]) 10/28/2012  . Other specified cardiac dysrhythmias(427.89) 10/26/2012  . Anxiety   . Allergic rhinitis   . Insomnia   . Hot flashes   . GERD (gastroesophageal reflux disease)   . Depression   . IBS (irritable bowel syndrome)     Kathy Barker 05/15/2020, 4:08 PM  [place], [place], [address] Phone: [phone]   Fax:  [phone]  Name: Kathy Barker MRN: [id number] Date of Birth: 1960/08/07 Cheral Almas, PT 05/15/20 4:09 PM

## 2020-05-17 ENCOUNTER — Encounter: Payer: 59 | Admitting: Rehabilitation

## 2020-05-22 ENCOUNTER — Ambulatory Visit: Payer: 59

## 2020-05-23 ENCOUNTER — Other Ambulatory Visit: Payer: Self-pay | Admitting: Radiology

## 2020-05-24 ENCOUNTER — Other Ambulatory Visit: Payer: Self-pay

## 2020-05-24 ENCOUNTER — Ambulatory Visit (HOSPITAL_COMMUNITY)
Admission: RE | Admit: 2020-05-24 | Discharge: 2020-05-24 | Disposition: A | Discharge status: Home / Self Care | Payer: 59 | Source: Ambulatory Visit | Attending: Adult Health | Admitting: Adult Health

## 2020-05-24 ENCOUNTER — Encounter: Payer: 59 | Admitting: Rehabilitation

## 2020-05-24 ENCOUNTER — Other Ambulatory Visit: Payer: Self-pay | Admitting: Adult Health

## 2020-05-24 ENCOUNTER — Encounter (HOSPITAL_COMMUNITY): Payer: Self-pay

## 2020-05-24 DIAGNOSIS — Z79899 Other long term (current) drug therapy: Secondary | ICD-10-CM | POA: Insufficient documentation

## 2020-05-24 DIAGNOSIS — Z452 Encounter for adjustment and management of vascular access device: Secondary | ICD-10-CM | POA: Insufficient documentation

## 2020-05-24 DIAGNOSIS — Z853 Personal history of malignant neoplasm of breast: Secondary | ICD-10-CM | POA: Diagnosis not present

## 2020-05-24 DIAGNOSIS — Z888 Allergy status to other drugs, medicaments and biological substances status: Secondary | ICD-10-CM | POA: Insufficient documentation

## 2020-05-24 DIAGNOSIS — Z885 Allergy status to narcotic agent status: Secondary | ICD-10-CM | POA: Diagnosis not present

## 2020-05-24 DIAGNOSIS — C50312 Malignant neoplasm of lower-inner quadrant of left female breast: Secondary | ICD-10-CM

## 2020-05-24 DIAGNOSIS — Z170 Estrogen receptor positive status [ER+]: Secondary | ICD-10-CM

## 2020-05-24 DIAGNOSIS — Z91040 Latex allergy status: Secondary | ICD-10-CM | POA: Diagnosis not present

## 2020-05-24 HISTORY — PX: IR REMOVAL TUN ACCESS W/ PORT W/O FL MOD SED: IMG2290

## 2020-05-24 LAB — CBC WITH DIFFERENTIAL/PLATELET
Abs Immature Granulocytes: 0.02 10*3/uL (ref 0.00–0.07)
Basophils Absolute: 0 10*3/uL (ref 0.0–0.1)
Basophils Relative: 0 %
Eosinophils Absolute: 0 10*3/uL (ref 0.0–0.5)
Eosinophils Relative: 0 %
HCT: 39.2 % (ref 36.0–46.0)
Hemoglobin: 12.9 g/dL (ref 12.0–15.0)
Immature Granulocytes: 0 %
Lymphocytes Relative: 18 %
Lymphs Abs: 1 10*3/uL (ref 0.7–4.0)
MCH: 30.9 pg (ref 26.0–34.0)
MCHC: 32.9 g/dL (ref 30.0–36.0)
MCV: 93.8 fL (ref 80.0–100.0)
Monocytes Absolute: 0.4 10*3/uL (ref 0.1–1.0)
Monocytes Relative: 7 %
Neutro Abs: 4.3 10*3/uL (ref 1.7–7.7)
Neutrophils Relative %: 75 %
Platelets: 177 10*3/uL (ref 150–400)
RBC: 4.18 MIL/uL (ref 3.87–5.11)
RDW: 13.3 % (ref 11.5–15.5)
WBC: 5.7 10*3/uL (ref 4.0–10.5)
nRBC: 0 % (ref 0.0–0.2)

## 2020-05-24 MED ORDER — SODIUM CHLORIDE 0.9 % IV SOLN: INTRAVENOUS | Status: DC

## 2020-05-24 MED ORDER — LIDOCAINE-EPINEPHRINE 1 %-1:100000 IJ SOLN
INTRAMUSCULAR | Status: AC
  Filled 2020-05-24: qty 1

## 2020-05-24 NOTE — H&P (Signed)
Referring Physician(s): Causey,Lindsey Cornetto/[name],G  Supervising Physician: Sandi Mariscal  Patient Status:  [facility] OP  Chief Complaint:  "I'm getting my port out"  Subjective: Pt familiar to IR service from port a cath placement in 2021. She has a hx of left breast cancer in 2021 with prior lumpectomy /chemoradiation. She has completed treatment and presents today for port a cath removal. She denies fever,HA,CP, dyspnea, abd pain,N/V or bleeding. She does have oc cough from allergies and back pain.   Past Medical History:  Diagnosis Date  . Allergic rhinitis   . Anxiety    "only during my divorce" (11/18/2012)  . Cancer ([place])   . Depression    "only during my divorce" (11/18/2012)  . GERD (gastroesophageal reflux disease)   . High cholesterol   . Hot flashes   . IBS (irritable bowel syndrome)   . Insomnia   . Migraines    "lately more often; before SVT I'd get them ~ q 6 months" (11/18/2012)  . PONV (postoperative nausea and vomiting)   . 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 BREAST LUMPECTOMY WITH RADIOACTIVE SEED AND SENTINEL LYMPH NODE BIOPSY Left 12/14/2019   Procedure: LEFT BREAST LUMPECTOMY X 2 WITH RADIOACTIVE SEED AND SENTINEL LYMPH NODE BIOPSY;  Surgeon: [name], MD;  Location: [facility];  Service: General;  Laterality: Left;  PEC BLOCK  . CESAREAN SECTION  1992  . COMBINED HYSTERECTOMY VAGINAL / OOPHORECTOMY / A&P REPAIR  1999  . IR IMAGING GUIDED PORT INSERTION  06/30/2019  . 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 SUPRAVENTRICULAR TACHYCARDIA ABLATION N/A 11/18/2012   Procedure: SUPRAVENTRICULAR TACHYCARDIA ABLATION;  Surgeon: Coralyn Mark, MD;  Location: [facility];  Service: Cardiovascular;  Laterality: N/A;  . VAGINAL HYSTERECTOMY  ~ 2000       Allergies: Fentanyl, Other, Simvastatin, Venlafaxine, Versed [midazolam], and Latex  Medications: Prior to Admission medications   Medication Sig Start Date End Date Taking? Authorizing Provider  acyclovir (ZOVIRAX) 400 MG tablet TAKE 2 TABLETS BY MOUTH 3 TIMES A DAY FOR 10 DAYS THEN DECREASE TO 1 TABLET 2 TIMES A DAY 02/28/20   [name], [name], MD  gabapentin (NEURONTIN) 300 MG capsule Take 1 capsule (300 mg total) by mouth at bedtime. 04/12/20   [name], [name], MD  HYDROcodone-homatropine [facility]) 5-1.5 MG/5ML syrup TAKE 5MLS BY MOUTH EVERY 6 HOURS AS NEEDED 03/26/20 09/22/20  [name], MD  HYDROMET 5-1.5 MG/5ML syrup every 4 (four) hours as needed. 09/01/19   [provider]  metoCLOPramide (REGLAN) 10 MG tablet Take 1 tablet (10 mg total) by mouth 3 (three) times daily before meals. 04/27/20   [name], [name], MD  omeprazole (PRILOSEC) 40 MG capsule Take one capsule daily for 5 days starting on chemo day, then daily as needed 07/19/19   [name], [name], MD  omeprazole (PRILOSEC) 40 MG capsule Take 1 capsule (40 mg total) by mouth daily. 04/27/20   [name], [name], MD  sertraline (ZOLOFT) 100 MG tablet Take 100 mg by mouth daily. 09/22/17   [provider]  temazepam (RESTORIL) 15 MG capsule Take 15 mg by mouth at bedtime.    [provider]  prochlorperazine (COMPAZINE) 10 MG tablet Take 1 tablet (10 mg total) by mouth every 6 (six) hours as needed (Nausea  or vomiting). 06/08/19 10/26/19  [name], [name], MD     Vital Signs: Vitals:   05/24/20 1039  BP: (!) 151/83  Pulse: 87  Resp: 16  Temp: 98.1 F (36.7 C)  SpO2: 97%      Physical Exam awake/alert; chest- CTA bilat; clean, intact right chest wall port a cath; heart- RRR; abd- soft,+BS,NT; no LE edema  Imaging: No results found.  Labs:  CBC: Recent Labs    10/26/19 0822 02/16/20 1243 03/09/20 1352 04/12/20 1404  WBC 4.9 5.1 6.4 5.0  HGB 11.8* 13.4 13.9 13.3  HCT  35.3* 40.0 41.7 39.1  PLT 165 180 199 201    COAGS: Recent Labs    06/30/19 0830  INR 0.9    BMP: Recent Labs    08/31/19 1108 09/13/19 1305 09/27/19 0816 10/04/19 1241 10/19/19 0814 10/26/19 0822 02/16/20 1243 03/09/20 1352 04/12/20 1404  NA 139 140 143 139   < > 141 140 140 143  K 3.3* 3.8 4.1 3.9   < > 3.9 4.1 4.5 3.7  CL 106 107 108 107   < > 108 107 107 108  CO2 24 25 24 26    < > 26 25 23 24   GLUCOSE 156* 126* 118* 89   < > 119* 99 94 147*  BUN <4* 7 10 12    < > 9 10 9 13   CALCIUM 9.5 9.7 9.0 9.5   < > 9.3 9.3 9.6 9.0  CREATININE 0.69 0.69 0.69 0.71   < > 0.74 0.76 0.81 0.77  GFRNONAA >60 >60 >60 >60   < > >60 >60 >60 >60  GFRAA >60 >60 >60 >60  --   --   --   --   --    < > = values in this interval not displayed.    LIVER FUNCTION TESTS: Recent Labs    10/26/19 0822 02/16/20 1243 03/09/20 1352 04/12/20 1404  BILITOT 0.3 0.4 0.4 0.3  AST 23 31 31 25   ALT 37 49* 45* 35  ALKPHOS 123 137* 146* 149*  PROT 6.5 7.0 7.2 6.8  ALBUMIN 3.8 4.2 4.3 4.0    Assessment and Plan: Pt familiar to IR service from port a cath placement in 2021. She has a hx of left breast cancer in 2021 with prior lumpectomy /chemoradiation. She has completed treatment and presents today for port a cath removal. Details/risks of procedure, incl but not limited to, internal bleeding, infection, injury to adjacent structures d/w pt with her understanding and consent.    Electronically Signed: D. Rowe Robert, PA-C 05/24/2020, 10:39 AM   I spent a total of 20 minutes at the the patient's bedside AND on the patient's hospital floor or unit, greater than 50% of which was counseling/coordinating care for port a cath removal

## 2020-05-24 NOTE — Discharge Instructions (Signed)
Implanted Port Removal, Care After °This sheet gives you information about how to care for yourself after your procedure. Your health care provider may also give you more specific instructions. If you have problems or questions, contact your health care provider. °What can I expect after the procedure? °After the procedure, it is common to have: °· Soreness or pain near your incision. °· Some swelling or bruising near your incision. °Follow these instructions at home: °Medicines °· Take over-the-counter and prescription medicines only as told by your health care provider. °· If you were prescribed an antibiotic medicine, take it as told by your health care provider. Do not stop taking the antibiotic even if you start to feel better. °Bathing °· Do not take baths, swim, or use a hot tub until your health care provider approves. Ask your health care provider if you can take showers. You may only be allowed to take sponge baths. °Incision care °· Follow instructions from your health care provider about how to take care of your incision. Make sure you: °? Wash your hands with soap and water before you change your bandage (dressing). If soap and water are not available, use hand sanitizer. °? Change your dressing as told by your health care provider. °? Keep your dressing dry. °? Leave stitches (sutures), skin glue, or adhesive strips in place. These skin closures may need to stay in place for 2 weeks or longer. If adhesive strip edges start to loosen and curl up, you may trim the loose edges. Do not remove adhesive strips completely unless your health care provider tells you to do that. °· Check your incision area every day for signs of infection. Check for: °? More redness, swelling, or pain. °? More fluid or blood. °? Warmth. °? Pus or a bad smell.   °Driving °· Do not drive for 24 hours if you were given a medicine to help you relax (sedative) during your procedure. °· If you did not receive a sedative, ask your  health care provider when it is safe to drive.   °Activity °· Return to your normal activities as told by your health care provider. Ask your health care provider what activities are safe for you. °· Do not lift anything that is heavier than 10 lb (4.5 kg), or the limit that you are told, until your health care provider says that it is safe. °· Do not do activities that involve lifting your arms over your head. °General instructions °· Do not use any products that contain nicotine or tobacco, such as cigarettes and e-cigarettes. These can delay healing. If you need help quitting, ask your health care provider. °· Keep all follow-up visits as told by your health care provider. This is important. °Contact a health care provider if: °· You have more redness, swelling, or pain around your incision. °· You have more fluid or blood coming from your incision. °· Your incision feels warm to the touch. °· You have pus or a bad smell coming from your incision. °· You have pain that is not relieved by your pain medicine. °Get help right away if you have: °· A fever or chills. °· Chest pain. °· Difficulty breathing. °Summary °· After the procedure, it is common to have pain, soreness, swelling, or bruising near your incision. °· If you were prescribed an antibiotic medicine, take it as told by your health care provider. Do not stop taking the antibiotic even if you start to feel better. °· Do not drive for   24 hours if you were given a sedative during your procedure. °· Return to your normal activities as told by your health care provider. Ask your health care provider what activities are safe for you. °This information is not intended to replace advice given to you by your health care provider. Make sure you discuss any questions you have with your health care provider. °Document Revised: 02/12/2017 Document Reviewed: 02/12/2017 °Elsevier Patient Education © 2021 [employer] ° °

## 2020-05-24 NOTE — Procedures (Signed)
Pre Procedural Dx: Poor venous access Post Procedural Dx: Same  Successful removal of anterior chest wall port-a-cath.  EBL: Minimal  No immediate post procedural complications.   Jay Khira Cudmore, MD Pager #: [phone]   

## 2020-05-29 ENCOUNTER — Other Ambulatory Visit: Payer: Self-pay

## 2020-05-29 ENCOUNTER — Ambulatory Visit: Payer: 59

## 2020-05-29 DIAGNOSIS — G620 Drug-induced polyneuropathy: Secondary | ICD-10-CM

## 2020-05-29 DIAGNOSIS — R262 Difficulty in walking, not elsewhere classified: Secondary | ICD-10-CM

## 2020-05-29 DIAGNOSIS — R293 Abnormal posture: Secondary | ICD-10-CM

## 2020-05-29 DIAGNOSIS — M6281 Muscle weakness (generalized): Secondary | ICD-10-CM

## 2020-05-29 DIAGNOSIS — C50312 Malignant neoplasm of lower-inner quadrant of left female breast: Secondary | ICD-10-CM

## 2020-05-29 DIAGNOSIS — T451X5A Adverse effect of antineoplastic and immunosuppressive drugs, initial encounter: Secondary | ICD-10-CM

## 2020-05-29 NOTE — Therapy (Signed)
[place], [place], [address] Phone: [phone]   Fax:  [phone]  Physical Therapy Treatment  Patient Details  Name: Kathy Barker MRN: [id number] Date of Birth: [birth year] Referring Provider (PT): Dr. Jana Hakim   Encounter Date: 05/29/2020   PT End of Session - 05/29/20 1457    Visit Number 6    Number of Visits 14    Date for PT Re-Evaluation 06/05/20    PT Start Time 3299    PT Stop Time 1455    PT Time Calculation (min) 50 min    Equipment Utilized During Treatment Gait belt    Activity Tolerance Patient tolerated treatment well    Behavior During Therapy [facility] for tasks assessed/performed           Past Medical History:  Diagnosis Date  . Allergic rhinitis   . Anxiety    "only during my divorce" (11/18/2012)  . Cancer ([place])   . Depression    "only during my divorce" (11/18/2012)  . GERD (gastroesophageal reflux disease)   . High cholesterol   . Hot flashes   . IBS (irritable bowel syndrome)   . Insomnia   . Migraines    "lately more often; before SVT I'd get them ~ q 6 months" (11/18/2012)  . PONV (postoperative nausea and vomiting)   . 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 BREAST LUMPECTOMY WITH RADIOACTIVE SEED AND SENTINEL LYMPH NODE BIOPSY Left 12/14/2019   Procedure: LEFT BREAST LUMPECTOMY X 2 WITH RADIOACTIVE SEED AND SENTINEL LYMPH NODE BIOPSY;  Surgeon: [name], MD;  Location: [facility];  Service: General;  Laterality: Left;  PEC BLOCK  . CESAREAN SECTION  1992  . COMBINED HYSTERECTOMY VAGINAL / OOPHORECTOMY / A&P REPAIR  1999  . IR IMAGING GUIDED PORT INSERTION  06/30/2019  . IR REMOVAL TUN ACCESS W/ PORT W/O FL MOD SED  05/24/2020  . SUPRAVENTRICULAR TACHYCARDIA ABLATION  11-18-2012   slow pathway modification of AVNRT by Dr  Rayann Heman  . SUPRAVENTRICULAR TACHYCARDIA ABLATION N/A 11/18/2012   Procedure: SUPRAVENTRICULAR TACHYCARDIA ABLATION;  Surgeon: Coralyn Mark, MD;  Location: [facility] CATH LAB;  Service: Cardiovascular;  Laterality: N/A;  . VAGINAL HYSTERECTOMY  ~ 2000    There were no vitals filed for this visit.   Subjective Assessment - 05/29/20 1407    Subjective Had my porta cath removed last Thursday. I feel so much better. My knee hasn't bothered too much but I havent done much either.  I put my brace on today. The bottoms of my feet feel hot sometimes. [place] like my balance is better.  I can go up and down stairs without holding on now, If I am in a small space I can maneuver better without getting tangled up.    Pertinent History Patient was diagnosed on 05/30/2019 with left grade III invasive ductal carcinoma breast cancer. It measures 1.3 cm and is located in the lower inner quadrant. It is ER positive, PR negative and HER2 negative with a Ki67 of 80%. She has a hx of cardiac arrythmias and an ablation.  Pt had left lumpectomy on 12/14/19.    How long can you sit comfortably? unlimited    How long can you stand comfortably? not very long and need to be able to for work activities    How long can  you walk comfortably? have to be careful; get winded if I walk too far    Patient Stated Goals neuropathy, left shoulder weakness    Currently in Pain? No/denies    Pain Score 0-No pain                             [facility] Adult PT Treatment/Exercise - 05/29/20 0001      Exercises   Other Exercises  standing on airex bicep curls  red  x 10 both, overhead press 2# 1x 10 bil,      Knee/Hip Exercises: Stretches   Gastroc Stretch Both;3 reps;10 seconds   on rocker board     Knee/Hip Exercises: Standing   Heel Raises 1 set;20 reps   on ax, no hands, 1 set 10 with hands on bike   Wall Squat 1 set;10 reps   5 sec hold, yellow ball behind back   Rocker Board Other (comment)   forward and back, side to  side no hands, CGA PT   SLS bottle touches on touches on chair 2 x10 ea    Other Standing Knee Exercises marching on ax   on ax x 20 No assist     Knee/Hip Exercises: Seated   Ball Squeeze 10 x 5 sec purple ball squeeze    Sit to Sand 10 reps;without UE support      Shoulder Exercises: Seated   Horizontal ABduction Strengthening;Both;10 reps    Theraband Level (Shoulder Horizontal ABduction) Level 1 (Yellow)   boucning on ball     Shoulder Exercises: Standing   External Rotation Strengthening;Both;10 reps   on green ball bouncing   Theraband Level (Shoulder External Rotation) Level 1 (Yellow)    Extension Strengthening;Both;10 reps    Theraband Level (Shoulder Extension) Level 2 (Red)   on ax with feet together   Retraction Strengthening;Both;10 reps   on ax   Theraband Level (Shoulder Retraction) Level 2 (Red)      Shoulder Exercises: Therapy Ball   ABduction Both;10 reps   horizontal abduction yellow with bouncing   Other Therapy Ball Exercises bouncing with alternate arm raises x 10    Other Therapy Ball Exercises pelvic circles x 10 ea direction                       PT Long Term Goals - 04/24/20 1720      PT LONG TERM GOAL #1   Title Patient will demonstrate she has regained full shoulder ROM and function post operatively compared to baselines.    Time 6    Period Weeks    Status New    Target Date 05/22/20      PT LONG TERM GOAL #2   Title Pt will be able to perform 10 sit to stands in 30 seconds for improved function    Time 6    Period Weeks    Status New    Target Date 06/05/20      PT LONG TERM GOAL #3   Title Pt will be able to maintain SLS on each leg for greater than 20 seconds without LOB    Baseline 10    Time 6    Period Weeks    Status New    Target Date 06/05/20      PT LONG TERM GOAL #4   Title Pts bilateral grip strength will be improved to 20 lbs or greater Bilaterally  Baseline R 5.5,  left 3    Time 6    Period Weeks     Status New    Target Date 06/05/20      PT LONG TERM GOAL #5   Title Pt will be independent and compliant with HEP for bilateral UE and LE strength and balance    Time 6    Period Weeks    Status New    Target Date 06/05/20      Additional Long Term Goals   Additional Long Term Goals Yes      PT LONG TERM GOAL #6   Title Pt will report decreased symptoms of clumsiness by 50% or greater    Time 6    Period Weeks    Status New    Target Date 06/05/20                 Plan - 05/29/20 1457    Clinical Impression Statement Continued mostly bilateral LE activities in standing and sitting on ball, but also di 1 SLS activity.  Pt was able to perform all without increased pain in right knee.  She experienced 1 loss of balance with second set of SLS.  She notes improved balance overall and is now able to go up and down stairs without handrail, and feels more stable when maneuvering in small spaces. Right leg fatigued at end of rx. Did not use gait belt today but PT nearby for SBA to CGA prn though CGA only needed for rockerboard.    Personal Factors and Comorbidities Comorbidity 3+    Comorbidities Left lumpectomy s/p chemo and radiation, CIPN,    Examination-Activity Limitations Reach Overhead;Squat;Stand;Lift;Locomotion Level    Examination-Participation Restrictions Cleaning;Occupation    Stability/Clinical Decision Making Stable/Uncomplicated    Rehab Potential Excellent    PT Frequency 2x / week    PT Duration 6 weeks    PT Treatment/Interventions ADLs/Self Care Home Management;Gait training;Therapeutic activities;Therapeutic exercise;Balance training;Neuromuscular re-education;Manual techniques;Patient/family education;Passive range of motion    PT Next Visit Plan (all exs in gait belt)UE theraband, functional strength training/mini squats/sit to stand etc, gait training , balance activities.  Will need to start with contact on bike or counter.  Fatigues easily    PT Home Exercise  Plan light gripping exs with playdoh or light putty to increase grip strength (10 reps gentle), standing TB retraction, ext, ER x 10 yellow, standing heel raises, marching, SLS    Consulted and Agree with Plan of Care Patient           Patient will benefit from skilled therapeutic intervention in order to improve the following deficits and impairments:  Postural dysfunction,Decreased range of motion,Impaired UE functional use,Decreased knowledge of precautions,Decreased activity tolerance,Decreased balance,Decreased strength,Impaired sensation,Pain,Difficulty walking  Visit Diagnosis: Chemotherapy-induced peripheral neuropathy (HCC)  Muscle weakness (generalized)  Difficulty in walking, not elsewhere classified  Abnormal posture  Malignant neoplasm of lower-inner quadrant of left breast in female, estrogen receptor positive ([place])     Problem List Patient Active Problem List   Diagnosis Date Noted  . Port-A-Cath in place 06/30/2019  . Malignant neoplasm of lower-inner quadrant of left breast in female, estrogen receptor positive ([place]) 06/06/2019  . SVT (supraventricular tachycardia) ([place]) 10/28/2012  . Other specified cardiac dysrhythmias(427.89) 10/26/2012  . Anxiety   . Allergic rhinitis   . Insomnia   . Hot flashes   . GERD (gastroesophageal reflux disease)   . Depression   . IBS (irritable bowel syndrome)     Shirlean Mylar  Janne Napoleon 05/29/2020, 3:02 PM  [place], [place], [address] Phone: [phone]   Fax:  [phone]  Name: Kathy Barker MRN: [id number] Date of Birth: 04/21/1960 Cheral Almas, PT 05/29/20 3:04 PM

## 2020-05-31 ENCOUNTER — Encounter: Payer: 59 | Admitting: Rehabilitation

## 2020-06-05 ENCOUNTER — Other Ambulatory Visit: Payer: Self-pay

## 2020-06-05 ENCOUNTER — Ambulatory Visit: Payer: 59

## 2020-06-05 DIAGNOSIS — Z170 Estrogen receptor positive status [ER+]: Secondary | ICD-10-CM

## 2020-06-05 DIAGNOSIS — T451X5A Adverse effect of antineoplastic and immunosuppressive drugs, initial encounter: Secondary | ICD-10-CM

## 2020-06-05 DIAGNOSIS — R293 Abnormal posture: Secondary | ICD-10-CM

## 2020-06-05 DIAGNOSIS — R262 Difficulty in walking, not elsewhere classified: Secondary | ICD-10-CM

## 2020-06-05 DIAGNOSIS — C50312 Malignant neoplasm of lower-inner quadrant of left female breast: Secondary | ICD-10-CM

## 2020-06-05 DIAGNOSIS — G620 Drug-induced polyneuropathy: Secondary | ICD-10-CM | POA: Diagnosis not present

## 2020-06-05 DIAGNOSIS — M6281 Muscle weakness (generalized): Secondary | ICD-10-CM

## 2020-06-05 NOTE — Therapy (Signed)
[place], [place], [address] Phone: [phone]   Fax:  [phone]  Physical Therapy Treatment  Patient Details  Name: Kathy Barker MRN: [id number] Date of Birth: 28-Jul-1960 Referring Provider (PT): Dr. Jana Hakim   Encounter Date: 06/05/2020   PT End of Session - 06/05/20 1748    Visit Number 7    Number of Visits 19    Date for PT Re-Evaluation 07/17/20    PT Start Time 0569    PT Stop Time 1445    PT Time Calculation (min) 42 min    Activity Tolerance Patient tolerated treatment well    Behavior During Therapy [facility] for tasks assessed/performed           Past Medical History:  Diagnosis Date  . Allergic rhinitis   . Anxiety    "only during my divorce" (11/18/2012)  . Cancer ([place])   . Depression    "only during my divorce" (11/18/2012)  . GERD (gastroesophageal reflux disease)   . High cholesterol   . Hot flashes   . IBS (irritable bowel syndrome)   . Insomnia   . Migraines    "lately more often; before SVT I'd get them ~ q 6 months" (11/18/2012)  . PONV (postoperative nausea and vomiting)   . 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 BREAST LUMPECTOMY WITH RADIOACTIVE SEED AND SENTINEL LYMPH NODE BIOPSY Left 12/14/2019   Procedure: LEFT BREAST LUMPECTOMY X 2 WITH RADIOACTIVE SEED AND SENTINEL LYMPH NODE BIOPSY;  Surgeon: [name], MD;  Location: [facility];  Service: General;  Laterality: Left;  PEC BLOCK  . CESAREAN SECTION  1992  . COMBINED HYSTERECTOMY VAGINAL / OOPHORECTOMY / A&P REPAIR  1999  . IR IMAGING GUIDED PORT INSERTION  06/30/2019  . IR REMOVAL TUN ACCESS W/ PORT W/O FL MOD SED  05/24/2020  . 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 SUPRAVENTRICULAR TACHYCARDIA ABLATION N/A  11/18/2012   Procedure: SUPRAVENTRICULAR TACHYCARDIA ABLATION;  Surgeon: Coralyn Mark, MD;  Location: [facility] CATH LAB;  Service: Cardiovascular;  Laterality: N/A;  . VAGINAL HYSTERECTOMY  ~ 2000    There were no vitals filed for this visit.   Subjective Assessment - 06/05/20 1403    Subjective Still feel like my balance is doing better, and can go up and down stairs without holding on for the most part.  I feel like I can maneuver better in small spaces without getting tangled up.  Grip strength still feels weak  I am going to make an appt for my knee.I feel like I can be on my feet longer.Less trembling in right LE. Moderate knee pain with wt. bearing activities.    Pertinent History Patient was diagnosed on 05/30/2019 with left grade III invasive ductal carcinoma breast cancer. It measures 1.3 cm and is located in the lower inner quadrant. It is ER positive, PR negative and HER2 negative with a Ki67 of 80%. She has a hx of cardiac arrythmias and an ablation.  Pt had left lumpectomy on 12/14/19.    How long can you sit comfortably? unlimited    How long can you stand comfortably? not very long and need to be able to for work activities    How long can you walk comfortably? have to be careful;  get winded if I walk too far    Patient Stated Goals neuropathy, left shoulder weakness    Currently in Pain? Yes    Pain Score 5     Pain Location Knee    Pain Orientation Right    Pain Descriptors / Indicators Aching    Pain Type Chronic pain    Pain Onset More than a month ago    Pain Frequency Intermittent              [facility] PT Assessment - 06/05/20 0001      Assessment   Medical Diagnosis Left breast cancer    Referring Provider (PT) Dr. Jana Hakim    Onset Date/Surgical Date 05/30/19    Hand Dominance Right      Precautions   Precaution Comments lymphadema risk      AROM   Left Shoulder Extension 55 Degrees    Left Shoulder Flexion 160 Degrees    Left Shoulder ABduction 150 Degrees       Strength   Right Hand Grip (lbs) 11.66    Left Hand Grip (lbs) 11.33    Right Hip Flexion 5/5    Right Hip External Rotation  4/5    Right Hip Internal Rotation 4+/5    Right Hip ABduction 4+/5    Right Hip ADduction 4/5    Left Hip Flexion 4/5    Left Hip External Rotation 4+/5    Left Hip Internal Rotation 4/5    Left Hip ABduction 4/5    Left Hip ADduction 4/5    Right Knee Flexion 4+/5    Right Knee Extension --   4/5 to 4+/5   Left Knee Flexion 4+/5    Left Knee Extension 4+/5    Right Ankle Dorsiflexion 5/5    Right Ankle Plantar Flexion 5/5    Left Ankle Dorsiflexion 5/5    Left Ankle Plantar Flexion 5/5      Transfers   Comments 30 sec sit to stand: 8 reps.                                      PT Long Term Goals - 06/05/20 1422      PT LONG TERM GOAL #1   Title Patient will demonstrate she has regained full shoulder ROM and function post operatively compared to baselines.    Baseline still lacking shoulder abduction on the left    Time 6    Period Weeks    Status On-going      PT LONG TERM GOAL #2   Title Pt will be able to perform 10 sit to stands in 30 seconds for improved function    Baseline 7 at baseline, 8 06/05/20    Time 6    Period Weeks    Status On-going      PT LONG TERM GOAL #3   Title Pt will be able to maintain SLS on each leg for greater than 20 seconds without LOB    Baseline 15 sec left, 16.5 sec right 06/05/2020    Time 6    Period Weeks    Status On-going      PT LONG TERM GOAL #4   Title Pts bilateral grip strength will be improved to 20 lbs or greater Bilaterally    Baseline 11.5 right, left 11 left    Time 6    Period Weeks    Status  On-going      PT LONG TERM GOAL #5   Title Pt will be independent and compliant with HEP for bilateral UE and LE strength and balance    Time 6    Period Weeks    Status Achieved      Additional Long Term Goals   Additional Long Term Goals Yes                  Plan - 06/05/20 1749    Clinical Impression Statement Pt is independent and compliant with her home exercise program.  She has increased grip strength bilaterally but still continues with considerable weakness.  She was able to perform an additional sit to stand today in 30 seconds but is not yet at a fully  functional level.  She has some increased strength noted in LE's but continues with weakness throughout.  Left shoulder abd is still mildly limited despite pt doing her exs at home.  She continues to be hampered with standing exercises because of right knee pain, and it has been suggested that she see an orthopedic MD for her knee and her very weak grip strength. She does note good improvement with balance and is able to go up and down stairs without holding all the time.  She also notes better ability to maneuver in small spaces without balance difficulties.  She will benefit from further PT to continue to address deficits and return to PLOF to enable her to return to work activities    Personal Factors and Comorbidities Comorbidity 3+    Comorbidities Left lumpectomy s/p chemo and radiation, CIPN,    Examination-Activity Limitations Reach Overhead;Squat;Stand;Lift;Locomotion Level    Examination-Participation Restrictions Cleaning;Occupation    Stability/Clinical Decision Making Stable/Uncomplicated    Rehab Potential Excellent    PT Frequency 2x / week    PT Duration 6 weeks    PT Treatment/Interventions ADLs/Self Care Home Management;Gait training;Therapeutic activities;Therapeutic exercise;Balance training;Neuromuscular re-education;Manual techniques;Patient/family education;Passive range of motion    PT Next Visit Plan (UE theraband, functional strength training/mini squats/sit to stand etc, gait training , balance activities.  Will need to start with contact on bike or counter.  Fatigues easily, STM to pecs, lats, PROM shoulder abd (left)    PT Home Exercise Plan light  gripping exs with playdoh or light putty to increase grip strength (10 reps gentle), standing TB retraction, ext, ER x 10 yellow, standing heel raises, marching, SLS    Consulted and Agree with Plan of Care Patient           Patient will benefit from skilled therapeutic intervention in order to improve the following deficits and impairments:  Postural dysfunction,Decreased range of motion,Impaired UE functional use,Decreased knowledge of precautions,Decreased activity tolerance,Decreased balance,Decreased strength,Impaired sensation,Pain,Difficulty walking  Visit Diagnosis: Chemotherapy-induced peripheral neuropathy (HCC)  Muscle weakness (generalized)  Difficulty in walking, not elsewhere classified  Abnormal posture  Malignant neoplasm of lower-inner quadrant of left breast in female, estrogen receptor positive ([place])     Problem List Patient Active Problem List   Diagnosis Date Noted  . Port-A-Cath in place 06/30/2019  . Malignant neoplasm of lower-inner quadrant of left breast in female, estrogen receptor positive ([place]) 06/06/2019  . SVT (supraventricular tachycardia) ([place]) 10/28/2012  . Other specified cardiac dysrhythmias(427.89) 10/26/2012  . Anxiety   . Allergic rhinitis   . Insomnia   . Hot flashes   . GERD (gastroesophageal reflux disease)   . Depression   . IBS (irritable bowel syndrome)  Claris Pong 06/05/2020, 5:57 PM  [place] [place], [place], [address] Phone: [phone]   Fax:  [phone]  Name: Kathy Barker MRN: [id number] Date of Birth: August 06, 1960  Cheral Almas, PT 06/05/20 5:58 PM

## 2020-06-07 ENCOUNTER — Encounter: Payer: 59 | Admitting: Rehabilitation

## 2020-06-13 ENCOUNTER — Inpatient Hospital Stay: Payer: 59 | Attending: Oncology | Admitting: Oncology

## 2020-06-13 DIAGNOSIS — R059 Cough, unspecified: Secondary | ICD-10-CM | POA: Insufficient documentation

## 2020-06-13 DIAGNOSIS — Z8249 Family history of ischemic heart disease and other diseases of the circulatory system: Secondary | ICD-10-CM | POA: Insufficient documentation

## 2020-06-13 DIAGNOSIS — G629 Polyneuropathy, unspecified: Secondary | ICD-10-CM | POA: Insufficient documentation

## 2020-06-13 DIAGNOSIS — Z90721 Acquired absence of ovaries, unilateral: Secondary | ICD-10-CM | POA: Insufficient documentation

## 2020-06-13 DIAGNOSIS — Z885 Allergy status to narcotic agent status: Secondary | ICD-10-CM | POA: Diagnosis not present

## 2020-06-13 DIAGNOSIS — Z803 Family history of malignant neoplasm of breast: Secondary | ICD-10-CM | POA: Diagnosis not present

## 2020-06-13 DIAGNOSIS — Z560 Unemployment, unspecified: Secondary | ICD-10-CM | POA: Diagnosis not present

## 2020-06-13 DIAGNOSIS — C50312 Malignant neoplasm of lower-inner quadrant of left female breast: Secondary | ICD-10-CM | POA: Diagnosis present

## 2020-06-13 DIAGNOSIS — Z79899 Other long term (current) drug therapy: Secondary | ICD-10-CM | POA: Diagnosis not present

## 2020-06-13 DIAGNOSIS — Z170 Estrogen receptor positive status [ER+]: Secondary | ICD-10-CM | POA: Insufficient documentation

## 2020-06-13 DIAGNOSIS — Z811 Family history of alcohol abuse and dependence: Secondary | ICD-10-CM | POA: Insufficient documentation

## 2020-06-13 MED ORDER — HYDROCODONE BIT-HOMATROP MBR 5-1.5 MG/5ML PO SOLN: 5.0000 mL | Freq: Four times a day (QID) | ORAL | 0 refills | Status: DC | PRN

## 2020-06-13 NOTE — Progress Notes (Signed)
[place]  [phone] [phone]     ID: Kathy Barker DOB: 09/22/1960  MR#: [id number]  NTI#:[id number]  Patient Care Team: [name], MD as PCP - General (Family Medicine) Rockwell Germany, RN as Oncology Nurse Navigator Mauro Kaufmann, RN as Oncology Nurse Navigator [name], MD as Consulting Physician (General Surgery) Irene Collings, [name], MD as Consulting Physician (Oncology) [name], MD as Attending Physician (Radiation Oncology) Lavonna Monarch, MD as Consulting Physician (Dermatology) Chauncey Cruel, MD OTHER MD:  I connected with Kathy Barker on 06/13/20 at 11:00 AM EDT by video enabled telemedicine visit and verified that I am speaking with the correct person using two identifiers.   I discussed the limitations, risks, security and privacy concerns of performing an evaluation and management service by telemedicine and the availability of in-person appointments. I also discussed with the patient that there may be a patient responsible charge related to this service. The patient expressed understanding and agreed to proceed.   Other persons participating in the visit and their role in the encounter: None  Patient's location: Home Provider's location: [place]  Total time spent: 50 min   CHIEF COMPLAINT: Left-sided breast cancer  CURRENT TREATMENT: observation   INTERVAL HISTORY: Kathy Barker was contacted today for follow up of her left-sided breast cancer. She is now under observation.  Since her last visit, she had her port removed on 05/24/2020.  She tolerated that well   REVIEW OF SYSTEMS: Kathy Barker continues to have problems with cough.  This is really her main symptom at present.  She is using cough syrup regularly.  The benzonatate did not work for her.  The cough does wake her up at night.  She also has some pain in the inferior aspect of the surgical breast and that is of course, and.  She has no  erythema there.  She has an area in the upper left back which is itchy and likely also secondary to radiation.  This past weekend she took her mother to [place] and they did a lot of walking.  Aside from these issues a detailed review of systems today was stable  COVID 19 VACCINATION STATUS: She has had both immunizations, most recently April 2021   HISTORY OF CURRENT ILLNESS: From the original intake note:  Kathy Barker herself noted a palpable lower-inner left breast lump and immediately brought it to medical attention.. She underwent bilateral diagnostic mammography with tomography and left breast ultrasonography at [facility] on 05/30/2019 showing: breast density category B; 1.7 cm lobulated mass in left breast at 8 o'clock; no significant left axillary abnormalities.  Accordingly on 06/01/2019 she proceeded to biopsy of the left breast area in question. The pathology from this procedure ([id number]) showed: invasive ductal carcinoma, grade 3. Prognostic indicators significant for: estrogen receptor, 70% positive with weak staining intensity and progesterone receptor, 0% negative. Proliferation marker Ki67 at 80%. HER2 negative by immunohistochemistry (1+).  The patient's subsequent history is as detailed below.   PAST MEDICAL HISTORY: Past Medical History:  Diagnosis Date  . Allergic rhinitis   . Anxiety    "only during my divorce" (11/18/2012)  . Cancer ([place])   . Depression    "only during my divorce" (11/18/2012)  . GERD (gastroesophageal reflux disease)   . High cholesterol   . Hot flashes   . IBS (irritable bowel syndrome)   . Insomnia   . Migraines    "lately more often; before SVT I'd  get them ~ q 6 months" (11/18/2012)  . PONV (postoperative nausea and vomiting)   . 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 BREAST LUMPECTOMY WITH  RADIOACTIVE SEED AND SENTINEL LYMPH NODE BIOPSY Left 12/14/2019   Procedure: LEFT BREAST LUMPECTOMY X 2 WITH RADIOACTIVE SEED AND SENTINEL LYMPH NODE BIOPSY;  Surgeon: [name], MD;  Location: [facility];  Service: General;  Laterality: Left;  PEC BLOCK  . CESAREAN SECTION  1992  . COMBINED HYSTERECTOMY VAGINAL / OOPHORECTOMY / A&P REPAIR  1999  . IR IMAGING GUIDED PORT INSERTION  06/30/2019  . IR REMOVAL TUN ACCESS W/ PORT W/O FL MOD SED  05/24/2020  . 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 SUPRAVENTRICULAR TACHYCARDIA ABLATION N/A 11/18/2012   Procedure: SUPRAVENTRICULAR TACHYCARDIA ABLATION;  Surgeon: Coralyn Mark, MD;  Location: [facility] CATH LAB;  Service: Cardiovascular;  Laterality: N/A;  . VAGINAL HYSTERECTOMY  ~ 2000    FAMILY HISTORY: Family History  Problem Relation Age of Onset  . Heart attack Father   . Alcohol abuse Father   . Heart attack Paternal Grandfather   . Heart Problems Other        both sides of family  . Breast cancer Mother    Her father died at age 55 from alcohol abuse. Her mother is living at age 64 (as of 05/2019) and has a history of DCIS at age 31 and invasive lobular breast cancer at age 70. Kathy Barker has one brother. She reports cancer of an unknown type in her maternal grandmother and prostate cancer in a maternal uncle.   GYNECOLOGIC HISTORY:  No LMP recorded. Patient has had a hysterectomy. Menarche: 60 years old Age at first live birth: 60 years old [place] P 2 LMP 2000 Contraceptive: never used HRT never used  Hysterectomy? yes BSO? 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  She lost her job during her chemo treatments.  She is divorced. She lives at home with son [name], age 22, who works in Biomedical scientist in [place]. Son [name], age 24, works in Sales executive here in [place].  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     ADVANCED DIRECTIVES: Not in place. She intends to name both of her sons as her 43.   HEALTH MAINTENANCE: Social History   Tobacco Use  . Smoking status: Never Smoker  . Smokeless tobacco: Never Used  Vaping Use  . Vaping Use: Never used  Substance Use Topics  . Alcohol use: Yes    Comment: 11/18/2012 "shot of brandy couple times/month"  . Drug use: No     Colonoscopy: yes, date unsure  PAP: date unsure  Bone density: never done   Allergies  Allergen Reactions  . Fentanyl Nausea And Vomiting    Per patient " extreme nausea and vomiting "   . Other Other (See Comments)    Problems with stitches  . Simvastatin Other (See Comments)  . Venlafaxine Other (See Comments)  . Versed [Midazolam] Nausea And Vomiting    Per patient " extreme N/V"   . Latex Rash    Current Outpatient Medications  Medication Sig Dispense Refill  . acyclovir (ZOVIRAX) 400 MG tablet TAKE 2 TABLETS BY MOUTH 3 TIMES A  DAY FOR 10 DAYS THEN DECREASE TO 1 TABLET 2 TIMES A DAY 120 tablet 3  . gabapentin (NEURONTIN) 300 MG capsule Take 1 capsule (300 mg total) by mouth at bedtime. 90 capsule 4  . HYDROcodone-homatropine (HYCODAN) 5-1.5 MG/5ML syrup TAKE 5MLS BY MOUTH EVERY 6 HOURS AS NEEDED 120 mL 0  . HYDROMET 5-1.5 MG/5ML syrup every 4 (four) hours as needed.    . metoCLOPramide (REGLAN) 10 MG tablet Take 1 tablet (10 mg total) by mouth 3 (three) times daily before meals. 90 tablet 3  . omeprazole (PRILOSEC) 40 MG capsule Take one capsule daily for 5 days starting on chemo day, then daily as needed 60 capsule 3  . omeprazole (PRILOSEC) 40 MG capsule Take 1 capsule (40 mg total) by mouth daily. 90 capsule 3  . sertraline (ZOLOFT) 100 MG tablet Take 100 mg by mouth daily.    . temazepam (RESTORIL) 15 MG capsule Take 15 mg by mouth at bedtime.     No current facility-administered medications for this visit.    OBJECTIVE: White woman who appears stated age There were no  vitals filed for this visit.   There is no height or weight on file to calculate BMI.   Wt Readings from Last 3 Encounters:  05/24/20 140 lb (63.5 kg)  02/16/20 138 lb 8 oz (62.8 kg)  12/14/19 139 lb 12.4 oz (63.4 kg)     ECOG FS:1 - Symptomatic but completely ambulatory  Telemedicine visit 06/13/2020  LAB RESULTS:  CMP     Component Value Date/Time   NA 143 04/12/2020 1404   K 3.7 04/12/2020 1404   CL 108 04/12/2020 1404   CO2 24 04/12/2020 1404   GLUCOSE 147 (H) 04/12/2020 1404   BUN 13 04/12/2020 1404   CREATININE 0.77 04/12/2020 1404   CALCIUM 9.0 04/12/2020 1404   PROT 6.8 04/12/2020 1404   ALBUMIN 4.0 04/12/2020 1404   AST 25 04/12/2020 1404   ALT 35 04/12/2020 1404   ALKPHOS 149 (H) 04/12/2020 1404   BILITOT 0.3 04/12/2020 1404   GFRNONAA >60 04/12/2020 1404   GFRAA >60 10/04/2019 1241    No results found for: TOTALPROTELP, ALBUMINELP, A1GS, A2GS, BETS, BETA2SER, GAMS, MSPIKE, SPEI  Lab Results  Component Value Date   WBC 5.7 05/24/2020   NEUTROABS 4.3 05/24/2020   HGB 12.9 05/24/2020   HCT 39.2 05/24/2020   MCV 93.8 05/24/2020   PLT 177 05/24/2020    No results found for: LABCA2  No components found for: SVXBLT903  No results for input(s): INR in the last 168 hours.  No results found for: LABCA2  No results found for: ESP233  No results found for: AQT622  No results found for: QJF354  No results found for: CA2729  No components found for: HGQUANT  No results found for: CEA1 / No results found for: CEA1   No results found for: AFPTUMOR  No results found for: CHROMOGRNA  No results found for: KPAFRELGTCHN, LAMBDASER, KAPLAMBRATIO (kappa/lambda light chains)  No results found for: HGBA, HGBA2QUANT, HGBFQUANT, HGBSQUAN (Hemoglobinopathy evaluation)   No results found for: LDH  No results found for: IRON, TIBC, IRONPCTSAT (Iron and TIBC)  No results found for: FERRITIN  Urinalysis No results found for: COLORURINE, APPEARANCEUR,  LABSPEC, PHURINE, GLUCOSEU, HGBUR, BILIRUBINUR, KETONESUR, PROTEINUR, UROBILINOGEN, NITRITE, LEUKOCYTESUR   STUDIES: IR REMOVAL TUN ACCESS W/ PORT W/O FL MOD SED  Result Date: 05/24/2020 CLINICAL DATA:  History of breast cancer. Completed chemotherapy. No longer in need of [facility]  a Catheter. Port a catheter was placed on 06/30/2019 by Dr. Loreta Ave and functioned well throughout duration of usage. There is no clinical sign or concern of infection. EXAM: REMOVAL OF IMPLANTED TUNNELED PORT-A-CATH MEDICATIONS: None ANESTHESIA/SEDATION: None FLUOROSCOPY TIME:  None PROCEDURE: Informed written consent was obtained from the patient after a discussion of the risk, benefits and alternatives to the procedure. The patient was positioned supine on the fluoroscopy table and the right chest Port-A-Cath site was prepped with chlorhexidine. A sterile gown and gloves were worn during the procedure. Local anesthesia was provided with 1% lidocaine with epinephrine. A timeout was performed prior to the initiation of the procedure. An incision was made overlying the Port-A-Cath with a #15 scalpel. Utilizing sharp and blunt dissection, the Port-A-Cath was removed completely. The pocked was irrigated with sterile saline. Wound closure was performed with interrupted subcutaneous 2-0 Vicryl sutures, Dermabond and Steri-Strips. Dressings were applied. The patient tolerated the procedure well without immediate post procedural complication. FINDINGS: Successful removal of implant Port-A-Cath without immediate post procedural complication. IMPRESSION: Successful removal of implanted Port-A-Cath. Electronically Signed   By: Simonne Come M.D.   On: 05/24/2020 12:49     ELIGIBLE FOR AVAILABLE RESEARCH PROTOCOL: no  ASSESSMENT: 60 y.o.  woman status post left breast lower inner quadrant biopsy 06/01/2019 for a clinical T1c N0, stage IB invasive ductal carcinoma, grade 3, weakly estrogen receptor positive but functionally triple  negative, with an MIB-1 of 80%.  (1) Oncotype score of 51 predicts a risk of recurrence outside the breast in the next 9 years of greater than 39% if the patient's only systemic therapy is antiestrogens for 5 years.  It also predicts a greater than 15% benefit from chemotherapy  (2) neoadjuvant chemotherapy consisting of doxorubicin and cyclophosphamide in dose dense fashion x4 started 06/30/2019, completed 08/23/2019, followed by weekly paclitaxel and carboplatin weekly x12 starting 09/13/2019, discontinued after 10/04/2019 dose  (a) carboplatin/paclitaxel discontinued after 3 doses with neuropathy developing  (b) repeat breast MRI 10/24/2019 shows resolution of the known malignancy  (3) Left lumpectomy on 12/14/2019 shows residual 0.3 cm invasive ductal carcinoma, margins negative, 3 sentinel lymph nodes negative  (a) repeat prognostic panel triple negative with an Mib-1 of 65%  (4) adjuvant radiation 01/24/2020 through 02/24/2020 Site Technique Total Dose (Gy) Dose per Fx (Gy) Completed Fx Beam Energies  Breast, Left: Breast_Lt 3D 40.05/40.05 2.67 15/15 6X, 10X  Breast, Left: Breast_Lt_Bst 3D 10/10 2 5/5 6X, 10X     PLAN: Kathy Barker is continuing to recover from her treatments.  Right now what is bothering her the most is the cough.  I think this is going to be due to her radiation treatments and this is also the reason she is having the itching and discoloration in the left upper back.  I think with a little bit more time all this will clear.  In the meantime I have refilled her cough medication which will help her get through the next couple of weeks.  She is already exercising which is very favorable.  She is continuing with physical therapy and she is already scheduled to see [place] again in August.  I will make sure to pop in that day just to say hello during that visit  Otherwise I will see her again in October for full reevaluation   Raymond Gurney C. Karee Forge, MD 06/13/20 11:06 AM Medical Oncology  and Hematology [facility] [address] [place], [place] [address] Tel. [phone]    Fax. [phone]  I, Wilburn Mylar, am acting as scribe for Dr. Sarajane Jews C. Dominigue Gellner.  I, Lurline Del MD, have reviewed the above documentation for accuracy and completeness, and I agree with the above.   *Total Encounter Time as defined by the Centers for Medicare and Medicaid Services includes, in addition to the face-to-face time of a patient visit (documented in the note above) non-face-to-face time: obtaining and reviewing outside history, ordering and reviewing medications, tests or procedures, care coordination (communications with other health care professionals or caregivers) and documentation in the medical record.

## 2020-06-21 ENCOUNTER — Telehealth: Payer: Self-pay | Admitting: *Deleted

## 2020-06-21 ENCOUNTER — Encounter: Payer: Self-pay | Admitting: Oncology

## 2020-06-21 ENCOUNTER — Encounter: Payer: Self-pay | Admitting: Rehabilitation

## 2020-06-21 DIAGNOSIS — S99912A Unspecified injury of left ankle, initial encounter: Secondary | ICD-10-CM

## 2020-06-21 NOTE — Telephone Encounter (Signed)
This RN called pt per her My Chart note stating she fell on 6/3 and twisted her left ankle- she heard a "pop" with severe pain- she slowly was able to get up on her right leg and in doing so - heard another pop in the left ankle.  She had difficulty getting into the house ( had to crawl ) but used ice and elevation with noted swelling.  She has continued to ice, elevate with some decrease in her swelling but weight bearing is still difficult.  She has been under PT ordered by Dr Jana Hakim for  strength training and CIPN  post breast cancer diagnosis and therapy - and they suggested she obtain xrays for possible fracture.  This RN placed order for complete ( 3 view ) x ray to evaluate for fracture.  Pt verbalized understanding of order and to go to [facility] as a walk in.

## 2020-06-22 ENCOUNTER — Ambulatory Visit (HOSPITAL_COMMUNITY)
Admission: RE | Admit: 2020-06-22 | Discharge: 2020-06-22 | Disposition: A | Discharge status: Home / Self Care | Payer: 59 | Source: Ambulatory Visit | Attending: Oncology | Admitting: Oncology

## 2020-06-22 ENCOUNTER — Encounter: Payer: Self-pay | Admitting: Oncology

## 2020-06-22 ENCOUNTER — Other Ambulatory Visit: Payer: Self-pay

## 2020-06-22 DIAGNOSIS — S99912A Unspecified injury of left ankle, initial encounter: Secondary | ICD-10-CM | POA: Insufficient documentation

## 2020-06-25 ENCOUNTER — Other Ambulatory Visit: Payer: Self-pay

## 2020-06-25 ENCOUNTER — Telehealth: Payer: Self-pay

## 2020-06-25 DIAGNOSIS — C50312 Malignant neoplasm of lower-inner quadrant of left female breast: Secondary | ICD-10-CM

## 2020-06-25 NOTE — Progress Notes (Unsigned)
B

## 2020-06-25 NOTE — Telephone Encounter (Signed)
Called pt to review xray results. Pt understands he does have a(L) ankle fx. Referral placed to [facility]. Pt is aware and states she will cal for appt.

## 2020-06-26 ENCOUNTER — Ambulatory Visit (INDEPENDENT_AMBULATORY_CARE_PROVIDER_SITE_OTHER): Payer: 59 | Admitting: Orthopedic Surgery

## 2020-06-26 ENCOUNTER — Other Ambulatory Visit: Payer: Self-pay

## 2020-06-26 DIAGNOSIS — S8265XA Nondisplaced fracture of lateral malleolus of left fibula, initial encounter for closed fracture: Secondary | ICD-10-CM | POA: Diagnosis not present

## 2020-06-27 ENCOUNTER — Telehealth: Payer: Self-pay | Admitting: Adult Health

## 2020-06-27 ENCOUNTER — Telehealth: Payer: Self-pay

## 2020-06-27 NOTE — Telephone Encounter (Signed)
Pt. Called to cancel appts.  She fractured her ankle and will be on hold for 4 weeks.

## 2020-06-27 NOTE — Telephone Encounter (Signed)
Received voice mail from patient.  She saw Dr. Sharol Given this morning and was placed in a boot for her fracture.  She will wear this for four weeks and will undergo repeat xray at that time for f/u.    I returned patients call and let her know that this all sounds great and we will follow along.  I wished her a speedy recovery of her ankle.    Wilber Bihari, NP

## 2020-07-06 ENCOUNTER — Encounter: Payer: Self-pay | Admitting: Orthopedic Surgery

## 2020-07-06 NOTE — Progress Notes (Signed)
Office Visit Note   Patient: Kathy Barker           Date of Birth: [birth year]           MRN: [id number] Visit Date: 06/26/2020              Requested by: [name], MD [address] [place] [address],  [place] [address] PCP: [name], MD  Chief Complaint  Patient presents with   Left Ankle - Fracture      HPI: Patient is a 60 year old woman who is seen for initial evaluation for nondisplaced left ankle Weber a fibular fracture.  Patient states that she [place] while walking across her yard.  Assessment & Plan: Visit Diagnoses:  1. Nondisplaced fracture of lateral malleolus of left fibula, initial encounter for closed fracture     Plan: Patient is placed in a fracture boot weightbearing as tolerated reevaluate in 4 weeks with three-view radiographs of the left ankle which time anticipate she can be released with an ASO.  Follow-Up Instructions: Return in about 4 weeks (around 07/24/2020).   Ortho Exam  Patient is alert, oriented, no adenopathy, well-dressed, normal affect, normal respiratory effort. Examination of the left ankle patient has good pulses she is tender to palpation over the distal fibula.  Good ankle and subtalar motion.  Review of her radiographs shows a congruent mortise with a nondisplaced Weber a fibular fracture.  There is no posterior malleolar fracture.  Initial radiographs on June 10.  Imaging: No results found. No images are attached to the encounter.  Labs: No results found for: HGBA1C, ESRSEDRATE, CRP, LABURIC, REPTSTATUS, GRAMSTAIN, CULT, LABORGA   Lab Results  Component Value Date   ALBUMIN 4.0 04/12/2020   ALBUMIN 4.3 03/09/2020   ALBUMIN 4.2 02/16/2020    No results found for: MG No results found for: VD25OH  No results found for: PREALBUMIN CBC EXTENDED Latest Ref Rng & Units 05/24/2020 04/12/2020 03/09/2020  WBC 4.0 - 10.5 K/uL 5.7 5.0 6.4  RBC 3.87 - 5.11 MIL/uL 4.18 4.28 4.48  HGB 12.0 - 15.0 g/dL 12.9 13.3 13.9   HCT 36.0 - 46.0 % 39.2 39.1 41.7  PLT 150 - 400 K/uL 177 201 199  NEUTROABS 1.7 - 7.7 K/uL 4.3 3.4 4.5  LYMPHSABS 0.7 - 4.0 K/uL 1.0 1.2 1.3     There is no height or weight on file to calculate BMI.  Orders:  No orders of the defined types were placed in this encounter.  No orders of the defined types were placed in this encounter.    Procedures: No procedures performed  Clinical Data: No additional findings.  ROS:  All other systems negative, except as noted in the HPI. Review of Systems  Objective: Vital Signs: There were no vitals taken for this visit.  Specialty Comments:  No specialty comments available.  PMFS History: Patient Active Problem List   Diagnosis Date Noted   Port-A-Cath in place 06/30/2019   Malignant neoplasm of lower-inner quadrant of left breast in female, estrogen receptor positive ([place]) 06/06/2019   SVT (supraventricular tachycardia) ([place]) 10/28/2012   Other specified cardiac dysrhythmias(427.89) 10/26/2012   Anxiety    Allergic rhinitis    Insomnia    Hot flashes    GERD (gastroesophageal reflux disease)    Depression    IBS (irritable bowel syndrome)    Past Medical History:  Diagnosis Date   Allergic rhinitis    Anxiety    "only during my divorce" (11/18/2012)  Cancer [facility])    Depression    "only during my divorce" (11/18/2012)   GERD (gastroesophageal reflux disease)    High cholesterol    Hot flashes    IBS (irritable bowel syndrome)    Insomnia    Migraines    "lately more often; before SVT I'd get them ~ q 6 months" (11/18/2012)   PONV (postoperative nausea and vomiting)    SVT (supraventricular tachycardia) (HCC)    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 by Dr Rayann Heman    Family History  Problem Relation Age of Onset   Heart attack Father    Alcohol abuse Father    Heart attack Paternal Grandfather    Heart Problems Other        both sides of family   Breast cancer Mother     Past Surgical History:   Procedure Laterality Date   BREAST LUMPECTOMY WITH RADIOACTIVE SEED AND SENTINEL LYMPH NODE BIOPSY Left 12/14/2019   Procedure: LEFT BREAST LUMPECTOMY X 2 WITH RADIOACTIVE SEED AND SENTINEL LYMPH NODE BIOPSY;  Surgeon: [name], MD;  Location: [place];  Service: General;  Laterality: Left;  [place] / OOPHORECTOMY / A&P REPAIR  1999   IR IMAGING GUIDED PORT INSERTION  06/30/2019   IR REMOVAL TUN ACCESS W/ PORT W/O FL MOD SED  05/24/2020   SUPRAVENTRICULAR TACHYCARDIA ABLATION  11-18-2012   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   SUPRAVENTRICULAR TACHYCARDIA ABLATION N/A 11/18/2012   Procedure: SUPRAVENTRICULAR TACHYCARDIA ABLATION;  Surgeon: Coralyn Mark, MD;  Location: [place] CATH LAB;  Service: Cardiovascular;  Laterality: N/A;   VAGINAL HYSTERECTOMY  ~ 2000   Social History   Occupational History   Not on file  Tobacco Use   Smoking status: Never   Smokeless tobacco: Never  Vaping Use   Vaping Use: Never used  Substance and Sexual Activity   Alcohol use: Yes    Comment: 11/18/2012 "shot of brandy couple times/month"   Drug use: No   Sexual activity: Not Currently

## 2020-07-08 ENCOUNTER — Other Ambulatory Visit: Payer: Self-pay | Admitting: Oncology

## 2020-07-10 ENCOUNTER — Encounter: Payer: Self-pay | Admitting: Oncology

## 2020-07-10 ENCOUNTER — Other Ambulatory Visit: Payer: Self-pay | Admitting: Oncology

## 2020-07-17 ENCOUNTER — Encounter: Payer: Self-pay | Admitting: Oncology

## 2020-07-17 ENCOUNTER — Other Ambulatory Visit: Payer: Self-pay | Admitting: *Deleted

## 2020-07-17 MED ORDER — OMEPRAZOLE 40 MG PO CPDR
40.0000 mg | DELAYED_RELEASE_CAPSULE | Freq: Every day | ORAL | 3 refills | Status: DC
Start: 2020-07-17 — End: 2020-09-13

## 2020-07-19 ENCOUNTER — Encounter: Payer: Self-pay | Admitting: Oncology

## 2020-07-24 ENCOUNTER — Ambulatory Visit: Payer: 59 | Admitting: Family

## 2020-08-15 NOTE — Progress Notes (Signed)
SURVIVORSHIP VIRTUAL VISIT:  I connected with Kathy Barker on 08/15/20 at  2:00 PM EDT by my chart video and verified that I am speaking with the correct person using two identifiers.  I discussed the limitations, risks, security and privacy concerns of performing an evaluation and management service virtually and the availability of in person appointments. I also discussed with the patient that there may be a patient responsible charge related to this service. The patient expressed understanding and agreed to proceed.   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   06/06/2019 Oncotype testing   Oncotype score of 51 predicts a risk of recurrence outside the breast in the next 9 years of greater than 39% if the patient's only systemic therapy is antiestrogens for 5 years.  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             (b) repeat breast MRI 10/24/2019 shows resolution of the known malignancy   12/14/2019 Surgery   Left lumpectomy shows residual 0.3 cm invasive ductal carcinoma, margins negative, 3 sentinel lymph nodes negative             (a)  repeat prognostic panel triple negative with an Mib-1 of 65%   01/24/2020 - 02/24/2020 Radiation Therapy   (4) adjuvant radiation 01/24/2020 through 02/24/2020 Site Technique Total Dose (Gy) Dose per Fx (Gy) Completed Fx Beam Energies  Breast, Left: Breast_Lt 3D 40.05/40.05 2.67 15/15 6X, 10X  Breast, Left: Breast_Lt_Bst           INTERVAL HISTORY:  Kathy Barker to review her survivorship care plan detailing her treatment course for breast cancer, as well as monitoring long-term side effects of that treatment, education regarding health maintenance, screening, and overall wellness and health promotion.     Overall, Kathy Barker reports feeling quite well.  She broke her ankle, saw ortho, put a boot on, and will have PT and f/u soon.  She can walk on her ankle with the decreased swelling.  The ankle bone is sore, and she notes there is a   REVIEW OF SYSTEMS:  Review of Systems  Constitutional:  Negative for appetite change, chills, fatigue, fever and unexpected weight change.  HENT:   Negative for hearing loss, lump/mass and trouble swallowing.   Eyes:  Negative for eye problems and icterus.  Respiratory:  Negative for chest tightness, cough and shortness of breath.   Cardiovascular:  Negative for chest pain, leg swelling and palpitations.  Gastrointestinal:  Negative for abdominal distention, abdominal pain, constipation, diarrhea, nausea and vomiting.  Endocrine: Negative for hot flashes.  Genitourinary:  Negative for difficulty urinating.   Musculoskeletal:  Negative for arthralgias.  Skin:  Negative for itching and rash.  Neurological:  Negative for  dizziness, extremity weakness, headaches and numbness.  Hematological:  Negative for adenopathy. Does not bruise/bleed easily.  Psychiatric/Behavioral:  Negative for depression. The patient is not nervous/anxious.   Breast: Denies any new nodularity, masses, tenderness, nipple changes, or nipple discharge.      ONCOLOGY TREATMENT TEAM:   1. Surgeon:  Dr. Marlou Starks at [facility] 2. Medical Oncologist: Dr. Jana Hakim  3. Radiation Oncologist: Dr. 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 by Dr Rayann Heman   Past Surgical History:  Procedure Laterality Date   BREAST LUMPECTOMY WITH RADIOACTIVE SEED AND SENTINEL LYMPH NODE BIOPSY Left 12/14/2019   Procedure: LEFT BREAST LUMPECTOMY X 2 WITH RADIOACTIVE SEED AND SENTINEL LYMPH NODE BIOPSY;  Surgeon: [name], MD;  Location: [place];  Service: General;  Laterality: Left;  [place] / OOPHORECTOMY / A&P REPAIR  1999   IR IMAGING GUIDED PORT INSERTION  06/30/2019   IR REMOVAL TUN ACCESS W/ PORT W/O FL MOD SED  05/24/2020   SUPRAVENTRICULAR TACHYCARDIA ABLATION  11-18-2012   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   SUPRAVENTRICULAR TACHYCARDIA ABLATION N/A 11/18/2012   Procedure: SUPRAVENTRICULAR TACHYCARDIA ABLATION;  Surgeon: Coralyn Mark, MD;  Location: [place] CATH LAB;  Service: Cardiovascular;  Laterality: N/A;   VAGINAL HYSTERECTOMY  ~ 2000     ALLERGIES:  Allergies  Allergen Reactions   Fentanyl Nausea And Vomiting    Per patient " extreme nausea and vomiting "    Other Other (See Comments)    Problems with stitches   Simvastatin Other (See Comments)   Venlafaxine Other (See Comments)   Versed [Midazolam] Nausea And Vomiting    Per patient " extreme N/V"     Latex Rash     CURRENT MEDICATIONS:  Outpatient Encounter Medications as of 08/16/2020  Medication Sig   acyclovir (ZOVIRAX) 400 MG tablet TAKE 2 TABLETS BY MOUTH 3 TIMES A DAY FOR 10 DAYS THEN DECREASE TO 1 TABLET 2 TIMES A DAY   gabapentin (NEURONTIN) 300 MG capsule Take 1 capsule (300 mg total) by mouth at bedtime.   HYDROcodone bit-homatropine (HYDROMET) 5-1.5 MG/5ML syrup Take 5 mLs by mouth every 6 (six) hours as needed for cough.   HYDROMET 5-1.5 MG/5ML syrup every 4 (four) hours as needed.   metoCLOPramide (REGLAN) 10 MG tablet Take 1 tablet (10 mg total) by mouth 3 (three) times daily before meals.   omeprazole (PRILOSEC) 40 MG capsule Take 1 capsule (40 mg total) by mouth daily.   sertraline (ZOLOFT) 100 MG tablet Take 100 mg by mouth daily.   temazepam (RESTORIL) 15 MG capsule Take 15 mg by mouth at bedtime.   [DISCONTINUED] prochlorperazine (COMPAZINE) 10 MG tablet Take 1 tablet (10 mg total) by mouth every 6 (six) hours as needed (Nausea or vomiting).  No facility-administered encounter medications on file as of 08/16/2020.     ONCOLOGIC FAMILY HISTORY:  Family History  Problem Relation Age of Onset   Heart attack Father    Alcohol abuse Father    Heart attack Paternal Grandfather    Heart Problems Other        both sides of family   Breast cancer Mother      GENETIC COUNSELING/TESTING: See above  SOCIAL HISTORY:  Social History   Socioeconomic History   Marital status: Legally Separated    Spouse name: Not on file   Number of children: Not on file   Years of education: Not on file   Highest education level: Not on file  Occupational History   Not on file  Tobacco Use   Smoking status: Never   Smokeless tobacco: Never  Vaping Use   Vaping Use: Never used  Substance and Sexual Activity   Alcohol use: Yes    Comment: 11/18/2012 "shot of brandy couple times/month"   Drug use: No   Sexual activity: Not Currently  Other Topics Concern   Not on file   Social History Narrative   Lives in [place] with son age 54.  Works as a Armed forces logistics/support/administrative officer.   Social Determinants of Health   Financial Resource Strain: Not on file  Food Insecurity: Not on file  Transportation Needs: Not on file  Physical Activity: Not on file  Stress: Not on file  Social Connections: Not on file  Intimate Partner Violence: Not on file     OBSERVATIONS/OBJECTIVE:  Patient appears well, in no apparent distress, mood and behavior are normal, speech is normal, breathing is non labored, skin visualized without rash or lesion  LABORATORY DATA:  None for this visit.  DIAGNOSTIC IMAGING:  None for this visit.      ASSESSMENT AND PLAN:  Ms.. Barker is a pleasant 60 y.o. female with Stage IB left breast invasive ductal carcinoma, ER-/PR-/HER2-, diagnosed in 05/2019, treated with neoadjuvant chemotherapy, lumpectomy, and adjuvant radiation therapy.  She presents to the Survivorship Clinic for our initial meeting and routine follow-up post-completion of treatment for breast cancer.    1. Stage IB left breast cancer:  Kathy Barker is continuing to recover from definitive treatment for breast cancer. She will follow-up with her medical oncologist, Dr. Jana Hakim in 3 months with history and physical exam per surveillance protocol.  Her mammogram is due 09/2020; orders placed today.    Today, a comprehensive survivorship care plan and treatment summary was reviewed with the patient today detailing her breast cancer diagnosis, treatment course, potential late/long-term effects of treatment, appropriate follow-up care with recommendations for the future, and patient education resources.  A copy of this summary, along with a letter will be sent to the patient's primary care provider via mail/fax/In Basket message after today's visit.    2. Broken ankle: She is following up with ortho about this.  I am placing an order for bone density testing.    3. GERD: She is taking  Reglan and Omeprazole, and is still struggling with reflux.  I recommended that she see a gastroenterologist for further evaluation.  I sent Dr. Carlean Purl a message to see if he can evaluate her soon.  I recommended that she avoid NSAIDs for her ankle break due to her stomach.    4. Bone health:  She was given education on specific activities to promote bone health.  5. Cancer screening:  Due to Kathy Barker's history and her age,  she should receive screening for skin cancers, colon cancer, and gynecologic cancers.  The information and recommendations are listed on the patient's comprehensive care plan/treatment summary and were reviewed in detail with the patient.    6. Health maintenance and wellness promotion: Kathy Barker was encouraged to consume 5-7 servings of fruits and vegetables per day. We reviewed the "Nutrition Rainbow" handout, as well as the handout "Take Control of Your Health and Reduce Your Cancer Risk" from the [employer].  She was also encouraged to engage in moderate to vigorous exercise for 30 minutes per day most days of the week. We discussed the [employer] fitness program, which is designed for cancer survivors to help them become more physically fit after cancer treatments.  She was instructed to limit her alcohol consumption and continue to abstain from tobacco use.     7. Support services/counseling: It is not uncommon for this period of the patient's cancer care trajectory to be one of many emotions and stressors.  We discussed how this can be increasingly difficult during the times of quarantine and social distancing due to the COVID-19 pandemic.   She was given information regarding our available services and encouraged to contact me with any questions or for help enrolling in any of our support group/programs.    Follow up instructions:    -Return to cancer center in 3 months for f/u with Dr. Jana Hakim -Mammogram due in 09/2020 -Bone density in  09/2020 -Referral to Dr. Carlean Purl in GI for evaluation -Follow up with surgery in one year -She is welcome to return back to the Survivorship Clinic at any time; no additional follow-up needed at this time.  -Consider referral back to survivorship as a long-term survivor for continued surveillance  The patient was provided an opportunity to ask questions and all were answered. The patient agreed with the plan and demonstrated an understanding of the instructions.   The patient was advised to call back or seek an in-person evaluation if the symptoms worsen or if the condition fails to improve as anticipated.   Total encounter time: 30 minutes in SCP preparation, face to face visit time, care coordination, order entry, and documentation of the encounter.   Wilber Bihari, NP 08/15/20 11:03 AM Medical Oncology and Hematology [facility] [place], [place] [address] Tel. [phone]    Fax. [phone]  *Total Encounter Time as defined by the Centers for Medicare and Medicaid Services includes, in addition to the face-to-face time of a patient visit (documented in the note above) non-face-to-face time: obtaining and reviewing outside history, ordering and reviewing medications, tests or procedures, care coordination (communications with other health care professionals or caregivers) and documentation in the medical record.

## 2020-08-16 ENCOUNTER — Inpatient Hospital Stay: Payer: 59 | Attending: Oncology | Admitting: Adult Health

## 2020-08-16 DIAGNOSIS — E2839 Other primary ovarian failure: Secondary | ICD-10-CM

## 2020-08-16 DIAGNOSIS — Z170 Estrogen receptor positive status [ER+]: Secondary | ICD-10-CM

## 2020-08-16 DIAGNOSIS — C50312 Malignant neoplasm of lower-inner quadrant of left female breast: Secondary | ICD-10-CM

## 2020-08-22 ENCOUNTER — Telehealth: Payer: Self-pay

## 2020-08-22 NOTE — Telephone Encounter (Signed)
Attempted to reach the patient.  Her VM is full.  I will send a MYChart message

## 2020-08-23 NOTE — Telephone Encounter (Signed)
Patient is scheduled and aware of the appointment for 8/17 with Dr. Carlean Purl

## 2020-08-28 ENCOUNTER — Ambulatory Visit (INDEPENDENT_AMBULATORY_CARE_PROVIDER_SITE_OTHER): Payer: 59

## 2020-08-28 ENCOUNTER — Other Ambulatory Visit: Payer: Self-pay

## 2020-08-28 ENCOUNTER — Ambulatory Visit (INDEPENDENT_AMBULATORY_CARE_PROVIDER_SITE_OTHER): Payer: 59 | Admitting: Physician Assistant

## 2020-08-28 ENCOUNTER — Encounter: Payer: Self-pay | Admitting: Orthopedic Surgery

## 2020-08-28 DIAGNOSIS — S8265XA Nondisplaced fracture of lateral malleolus of left fibula, initial encounter for closed fracture: Secondary | ICD-10-CM | POA: Diagnosis not present

## 2020-08-28 NOTE — Progress Notes (Signed)
Office Visit Note   Patient: Kathy Barker           Date of Birth: 1960-06-20           MRN: [id number] Visit Date: 08/28/2020              Requested by: [name], MD [address] [place],  [place] [address] PCP: [name], MD  Chief Complaint  Patient presents with   Left Ankle - Follow-up    Lateral malleolus fx        HPI: Patient presents today for follow-up on her nondisplaced left ankle Weber a fracture.  She comes in today wearing sandals.  She says she still has some pain but is being careful how she walks.  She could not tolerate the boot.  Assessment & Plan: Visit Diagnoses:  1. Nondisplaced fracture of lateral malleolus of left fibula, initial encounter for closed fracture     Plan: Patient does have a ankle brace at home have asked that she wear that with a tennis shoe.  She may return to the cancer center where she was doing physical therapy for her neuropathy.  They can do proprioception functional strengthening gait training and fall prevention.  Follow-Up Instructions: No follow-ups on file.   Ortho Exam  Patient is alert, oriented, no adenopathy, well-dressed, normal affect, normal respiratory effort. Examination demonstrates no swelling no cellulitis or signs of infection.  She has good dorsiflexion and plantarflexion strength.  No deformity she has slight tenderness to deep palpation over the distal fibula.  Imaging: XR Ankle Complete Left  Result Date: 08/28/2020 3 views of her ankle demonstrate well-maintained alignment.  She has a nondisplaced Weber a fracture of the distal fibula.  She does have some bony bridging  No images are attached to the encounter.  Labs: No results found for: HGBA1C, ESRSEDRATE, CRP, LABURIC, REPTSTATUS, GRAMSTAIN, CULT, LABORGA   Lab Results  Component Value Date   ALBUMIN 4.0 04/12/2020   ALBUMIN 4.3 03/09/2020   ALBUMIN 4.2 02/16/2020    No results found for: MG No results found  for: VD25OH  No results found for: PREALBUMIN CBC EXTENDED Latest Ref Rng & Units 05/24/2020 04/12/2020 03/09/2020  WBC 4.0 - 10.5 K/uL 5.7 5.0 6.4  RBC 3.87 - 5.11 MIL/uL 4.18 4.28 4.48  HGB 12.0 - 15.0 g/dL 12.9 13.3 13.9  HCT 36.0 - 46.0 % 39.2 39.1 41.7  PLT 150 - 400 K/uL 177 201 199  NEUTROABS 1.7 - 7.7 K/uL 4.3 3.4 4.5  LYMPHSABS 0.7 - 4.0 K/uL 1.0 1.2 1.3     There is no height or weight on file to calculate BMI.  Orders:  Orders Placed This Encounter  Procedures   XR Ankle Complete Left   No orders of the defined types were placed in this encounter.    Procedures: No procedures performed  Clinical Data: No additional findings.  ROS:  All other systems negative, except as noted in the HPI. Review of Systems  Objective: Vital Signs: There were no vitals taken for this visit.  Specialty Comments:  No specialty comments available.  PMFS History: Patient Active Problem List   Diagnosis Date Noted   Port-A-Cath in place 06/30/2019   Malignant neoplasm of lower-inner quadrant of left breast in female, estrogen receptor positive ([place]) 06/06/2019   SVT (supraventricular tachycardia) ([place]) 10/28/2012   Other specified cardiac dysrhythmias(427.89) 10/26/2012   Anxiety    Allergic rhinitis    Insomnia  Hot flashes    GERD (gastroesophageal reflux disease)    Depression    IBS (irritable bowel syndrome)    Past Medical History:  Diagnosis Date   Allergic rhinitis    Anxiety    "only during my divorce" (11/18/2012)   Cancer ([place])    Depression    "only during my divorce" (11/18/2012)   GERD (gastroesophageal reflux disease)    High cholesterol    Hot flashes    IBS (irritable bowel syndrome)    Insomnia    Migraines    "lately more often; before SVT I'd get them ~ q 6 months" (11/18/2012)   PONV (postoperative nausea and vomiting)    SVT (supraventricular tachycardia) ([facility])    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 by Dr Rayann Heman    Family  History  Problem Relation Age of Onset   Heart attack Father    Alcohol abuse Father    Heart attack Paternal Grandfather    Heart Problems Other        both sides of family   Breast cancer Mother     Past Surgical History:  Procedure Laterality Date   BREAST LUMPECTOMY WITH RADIOACTIVE SEED AND SENTINEL LYMPH NODE BIOPSY Left 12/14/2019   Procedure: LEFT BREAST LUMPECTOMY X 2 WITH RADIOACTIVE SEED AND SENTINEL LYMPH NODE BIOPSY;  Surgeon: [name], MD;  Location: [place];  Service: General;  Laterality: Left;  [place] / OOPHORECTOMY / A&P REPAIR  1999   IR IMAGING GUIDED PORT INSERTION  06/30/2019   IR REMOVAL TUN ACCESS W/ PORT W/O FL MOD SED  05/24/2020   SUPRAVENTRICULAR TACHYCARDIA ABLATION  11-18-2012   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   SUPRAVENTRICULAR TACHYCARDIA ABLATION N/A 11/18/2012   Procedure: SUPRAVENTRICULAR TACHYCARDIA ABLATION;  Surgeon: Coralyn Mark, MD;  Location: [place] CATH LAB;  Service: Cardiovascular;  Laterality: N/A;   VAGINAL HYSTERECTOMY  ~ 2000   Social History   Occupational History   Not on file  Tobacco Use   Smoking status: Never   Smokeless tobacco: Never  Vaping Use   Vaping Use: Never used  Substance and Sexual Activity   Alcohol use: Yes    Comment: 11/18/2012 "shot of brandy couple times/month"   Drug use: No   Sexual activity: Not Currently

## 2020-08-29 ENCOUNTER — Encounter: Payer: Self-pay | Admitting: Internal Medicine

## 2020-08-29 ENCOUNTER — Encounter: Payer: Self-pay | Admitting: Oncology

## 2020-08-29 ENCOUNTER — Ambulatory Visit (INDEPENDENT_AMBULATORY_CARE_PROVIDER_SITE_OTHER): Payer: 59 | Admitting: Internal Medicine

## 2020-08-29 VITALS — BP 138/50 | HR 91 | Ht 62.0 in | Wt 139.2 lb

## 2020-08-29 DIAGNOSIS — R1319 Other dysphagia: Secondary | ICD-10-CM

## 2020-08-29 DIAGNOSIS — R12 Heartburn: Secondary | ICD-10-CM | POA: Diagnosis not present

## 2020-08-29 DIAGNOSIS — R1310 Dysphagia, unspecified: Secondary | ICD-10-CM | POA: Diagnosis not present

## 2020-08-29 NOTE — Patient Instructions (Signed)
If you are age 61 or older, your body mass index should be between 23-30. Your Body mass index is 25.47 kg/m. If this is out of the aforementioned range listed, please consider follow up with your Primary Care Provider.  If you are age 87 or younger, your body mass index should be between 19-25. Your Body mass index is 25.47 kg/m. If this is out of the aformentioned range listed, please consider follow up with your Primary Care Provider.   __________________________________________________________  The [facility] providers would like to encourage you to use [facility] to communicate with providers for non-urgent requests or questions.  Due to long hold times on the telephone, sending your provider a message by [facility] may be a faster and more efficient way to get a response.  Please allow 48 business hours for a response.  Please remember that this is for non-urgent requests.   You have been scheduled for an endoscopy. Please follow written instructions given to you at your visit today. If you use inhalers (even only as needed), please bring them with you on the day of your procedure.  We are requesting your past colonoscopy, path reports from your PCP.  I appreciate the opportunity to care for you. Silvano Rusk, MD, [facility]

## 2020-08-29 NOTE — Telephone Encounter (Signed)
This encounter was created in error - please disregard.

## 2020-08-29 NOTE — Progress Notes (Signed)
Kathy Barker Emily 60 y.o. [birth year] [id number] Referred by: Wilber Bihari, NP  Assessment & Plan:   Encounter Diagnoses  Name Primary?   Esophageal dysphagia Yes   Heartburn    Odynophagia    Evaluate with EGD.  Possible esophageal dilation.  Considerations are chemotherapy related issues, sounds like reflux question radiation effects question possible infection.  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  She could also have some sort of gastric outlet obstruction type problem that is exacerbating things.  The risks and benefits as well as alternatives of endoscopic procedure(s) have been discussed and reviewed. All questions answered. The patient agrees to proceed.  For completeness we will obtain records of her most recent colonoscopy.  I appreciate the opportunity to care for this patient. CC: [name], MD Gus [name] MD Wilber Bihari, NP   Subjective:   Chief Complaint: Dysphagia and reflux  HPI This 60 year old woman is here because of dysphagia and may be some odynophagia symptoms and burning chest pain consistent with heartburn.  It is frequently a problem when she lies down at night.  She had been undergoing chemotherapy for breast cancer she has had radiation as well.  She has had a history of heartburn in the past and may have had an endoscopy at some point in her life though we do not have those details, however this is quite a severe problem.  Dr. Jana Hakim and his team have put her on Reglan 10 mg 3 times daily and also she is on 40 mg omeprazole at night.  This has helped but she still has problems.  Dysphagia occurs with big pills and some hard meats and breads etc.  She has some gas and soft stools but says that is chronic.  She believes she had a colonoscopy within the past few years probably through [place] perhaps at [facility].  She said it was coordinated through her primary care  provider.   Wt Readings from Last 3 Encounters:  08/29/20 139 lb 4 oz (63.2 kg)  05/24/20 140 lb (63.5 kg)  02/16/20 138 lb 8 oz (62.8 kg)    Allergies  Allergen Reactions   Fentanyl Nausea And Vomiting    Per patient " extreme nausea and vomiting "    Other Other (See Comments)    Problems with stitches   Simvastatin Other (See Comments)   Venlafaxine Other (See Comments)   Versed [Midazolam] Nausea And Vomiting    Per patient " extreme N/V"    Latex Rash   Current Meds  Medication Sig   acyclovir (ZOVIRAX) 400 MG tablet TAKE 2 TABLETS BY MOUTH 3 TIMES A DAY FOR 10 DAYS THEN DECREASE TO 1 TABLET 2 TIMES A DAY   gabapentin (NEURONTIN) 300 MG capsule Take 1 capsule (300 mg total) by mouth at bedtime.   HYDROcodone bit-homatropine (HYDROMET) 5-1.5 MG/5ML syrup Take 5 mLs by mouth every 6 (six) hours as needed for cough.   HYDROMET 5-1.5 MG/5ML syrup every 4 (four) hours as needed.   metoCLOPramide (REGLAN) 10 MG tablet Take 1 tablet (10 mg total) by mouth 3 (three) times daily before meals.   omeprazole (PRILOSEC) 40 MG capsule Take 1 capsule (40 mg total) by mouth daily.   sertraline (ZOLOFT) 100 MG tablet Take 100 mg by mouth daily.   temazepam (RESTORIL) 15 MG capsule Take 15 mg by mouth at bedtime.   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 by Dr Rayann Heman   Past Surgical History:  Procedure Laterality Date   BREAST LUMPECTOMY WITH RADIOACTIVE SEED AND SENTINEL LYMPH NODE BIOPSY Left 12/14/2019    Procedure: LEFT BREAST LUMPECTOMY X 2 WITH RADIOACTIVE SEED AND SENTINEL LYMPH NODE BIOPSY;  Surgeon: [name], MD;  Location: [place];  Service: General;  Laterality: Left;  [place] / OOPHORECTOMY / A&P REPAIR  1999   IR IMAGING GUIDED PORT INSERTION  06/30/2019   IR REMOVAL TUN ACCESS W/ PORT W/O FL MOD SED  05/24/2020   SUPRAVENTRICULAR TACHYCARDIA ABLATION  11-18-2012   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   SUPRAVENTRICULAR TACHYCARDIA ABLATION N/A 11/18/2012   Procedure: SUPRAVENTRICULAR TACHYCARDIA ABLATION;  Surgeon: Coralyn Mark, MD;  Location: [place] CATH LAB;  Service: Cardiovascular;  Laterality: N/A;   VAGINAL HYSTERECTOMY  ~ 2000   Social History   Social History Narrative   She is divorced and has 2 adult sons.  1 son still lives with her.   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   family history includes Alcohol abuse in her father; Breast cancer in her mother; Heart Problems in an other family member; Heart attack in her father and paternal grandfather.   Review of Systems As per HPI She is recovering from a left ankle fracture.  She has been through PT for peripheral neuropathy. All other review of systems are negative  Objective:   Physical Exam '@BP'$  (!) 138/50   Pulse 91   Ht '5\' 2"'$  (1.575 m)   Wt 139 lb 4 oz (63.2 kg)   BMI 25.47 kg/m @  General:  Well-developed, well-nourished and in no acute distress Eyes:  anicteric. Lungs: Clear to auscultation bilaterally. Heart:  S1S2, no rubs, murmurs, gallops. Abdomen:  soft, non-tender, no hepatosplenomegaly, hernia, or mass and BS+.  Lymph:  no cervical or supraclavicular adenopathy. Neuro:  A&O x 3.  Psych:  appropriate mood and  Affect.   Data Reviewed:  Recent oncology notes labs in the computer imaging in  the computer

## 2020-09-03 ENCOUNTER — Telehealth: Payer: Self-pay | Admitting: Internal Medicine

## 2020-09-03 ENCOUNTER — Encounter: Payer: 59 | Admitting: Internal Medicine

## 2020-09-03 NOTE — Telephone Encounter (Signed)
Pt is scheduled for EGD this afternoon but has been throwing up and has flu like sxs. Pls call her.

## 2020-09-03 NOTE — Telephone Encounter (Signed)
Returned pts call.  She states that she has been throwing up and having diarrhea since yesterday.  She is unable to keep any liquids down at this point.  She would like to reschedule procedure if possible.  Rescheduled pt for Wednesday 8/24 @ 11:30 am.  Advised pt to call tomorrow afternoon if symptoms have not improved.

## 2020-09-03 NOTE — Telephone Encounter (Signed)
Noted and agree No charge

## 2020-09-05 ENCOUNTER — Encounter: Payer: Self-pay | Admitting: Internal Medicine

## 2020-09-05 ENCOUNTER — Ambulatory Visit (AMBULATORY_SURGERY_CENTER): Payer: 59 | Admitting: Internal Medicine

## 2020-09-05 ENCOUNTER — Other Ambulatory Visit: Payer: Self-pay

## 2020-09-05 ENCOUNTER — Encounter: Payer: Self-pay | Admitting: Oncology

## 2020-09-05 VITALS — BP 141/86 | HR 82 | Temp 98.0°F | Resp 12 | Ht 62.0 in | Wt 139.0 lb

## 2020-09-05 DIAGNOSIS — K2980 Duodenitis without bleeding: Secondary | ICD-10-CM | POA: Diagnosis not present

## 2020-09-05 DIAGNOSIS — K222 Esophageal obstruction: Secondary | ICD-10-CM | POA: Diagnosis not present

## 2020-09-05 DIAGNOSIS — R12 Heartburn: Secondary | ICD-10-CM | POA: Diagnosis not present

## 2020-09-05 DIAGNOSIS — R1319 Other dysphagia: Secondary | ICD-10-CM | POA: Diagnosis not present

## 2020-09-05 DIAGNOSIS — K449 Diaphragmatic hernia without obstruction or gangrene: Secondary | ICD-10-CM

## 2020-09-05 DIAGNOSIS — R1310 Dysphagia, unspecified: Secondary | ICD-10-CM | POA: Diagnosis not present

## 2020-09-05 MED ORDER — SODIUM CHLORIDE 0.9 % IV SOLN: 500.0000 mL | Freq: Once | INTRAVENOUS | Status: DC

## 2020-09-05 NOTE — Progress Notes (Signed)
Patient ID: Kathy Barker, female   DOB: 25-Nov-1960, 60 y.o.   MRN: [id number]  History and Physical Interval Note:  09/05/2020 11:38 AM  [name]  has presented today for endoscopic procedure(s), with the diagnosis of  Encounter Diagnoses  Name Primary?   Esophageal dysphagia Yes   Heartburn    Odynophagia   .  The various methods of evaluation and treatment have been discussed with the patient and/or family. After consideration of risks, benefits and other options for treatment, the patient has consented to  the endoscopic procedure(s).   The patient's history has been reviewed, patient examined, no change in status, stable for endoscopic procedure(s).  I have reviewed the patient's chart and labs.  Questions were answered to the patient's satisfaction.     Gatha Mayer, MD, [facility]

## 2020-09-05 NOTE — Patient Instructions (Addendum)
There was a stricture or narrow spot where esophagus and stomach meet. I dilated that.  I took biopsies of the esophagus and upper intestine also. The intestine looked inflamed but that could have been related to te recent vomiting.  No ulcers or cancer seen.  You do have a hiatal hernia - see handout.  Once I see biopsy results will regroup.  I appreciate the opportunity to care for you. Gatha Mayer, MD, [facility]    Thank you for letting us take care of your healthcare needs today. Please see handouts given to you on Dilation Diet and Hiatal Hernia.  YOU HAD AN ENDOSCOPIC PROCEDURE TODAY AT [facility]:   Refer to the procedure report that was given to you for any specific questions about what was found during the examination.  If the procedure report does not answer your questions, please call your gastroenterologist to clarify.  If you requested that your care partner not be given the details of your procedure findings, then the procedure report has been included in a sealed envelope for you to review at your convenience later.  YOU SHOULD EXPECT: Some feelings of bloating in the abdomen. Passage of more gas than usual.  Walking can help get rid of the air that was put into your GI tract during the procedure and reduce the bloating. If you had a lower endoscopy (such as a colonoscopy or flexible sigmoidoscopy) you may notice spotting of blood in your stool or on the toilet paper. If you underwent a bowel prep for your procedure, you may not have a normal bowel movement for a few days.  Please Note:  You might notice some irritation and congestion in your nose or some drainage.  This is from the oxygen used during your procedure.  There is no need for concern and it should clear up in a day or so.  SYMPTOMS TO REPORT IMMEDIATELY:  Following upper endoscopy (EGD)  Vomiting of blood or coffee ground material  New chest pain or pain under the shoulder blades  Painful or  persistently difficult swallowing  New shortness of breath  Fever of 100F or higher  Black, tarry-looking stools  For urgent or emergent issues, a gastroenterologist can be reached at any hour by calling [phone]. Do not use MyChart messaging for urgent concerns.    DIET:  Please see Post Dilation diet. Tomorrow We you may proceed to your regular diet.  Drink plenty of fluids but you should avoid alcoholic beverages for 24 hours.  ACTIVITY:  You should plan to take it easy for the rest of today and you should NOT DRIVE or use heavy machinery until tomorrow (because of the sedation medicines used during the test).    FOLLOW UP: Our staff will call the number listed on your records 48-72 hours following your procedure to check on you and address any questions or concerns that you may have regarding the information given to you following your procedure. If we do not reach you, we will leave a message.  We will attempt to reach you two times.  During this call, we will ask if you have developed any symptoms of COVID 19. If you develop any symptoms (ie: fever, flu-like symptoms, shortness of breath, cough etc.) before then, please call [phone].  If you test positive for Covid 19 in the 2 weeks post procedure, please call and report this information to [place].    If any biopsies were taken you will be contacted  by phone or by letter within the next 1-3 weeks.  Please call us at [phone] if you have not heard about the biopsies in 3 weeks.    SIGNATURES/CONFIDENTIALITY: You and/or your care partner have signed paperwork which will be entered into your electronic medical record.  These signatures attest to the fact that that the information above on your After Visit Summary has been reviewed and is understood.  Full responsibility of the confidentiality of this discharge information lies with you and/or your care-partner.

## 2020-09-05 NOTE — Progress Notes (Signed)
VS completed by DT.  Pt's states no medical or surgical changes since previsit or office visit.  

## 2020-09-05 NOTE — Op Note (Addendum)
[facility] Patient Name: Kathy Barker Procedure Date: 09/05/2020 11:40 AM MRN: [id number] Endoscopist: Gatha Mayer , MD Age: 60 Referring MD:  Date of Birth: [birth year] Gender: Female Account #: [id number] Procedure:                Upper GI endoscopy Indications:              Dysphagia, Odynophagia, Heartburn Medicines:                Propofol per Anesthesia, Monitored Anesthesia Care Procedure:                Pre-Anesthesia Assessment:                           - Prior to the procedure, a History and Physical                            was performed, and patient medications and                            allergies were reviewed. The patient's tolerance of                            previous anesthesia was also reviewed. The risks                            and benefits of the procedure and the sedation                            options and risks were discussed with the patient.                            All questions were answered, and informed consent                            was obtained. Prior Anticoagulants: The patient has                            taken no previous anticoagulant or antiplatelet                            agents. ASA Grade Assessment: III - A patient with                            severe systemic disease. After reviewing the risks                            and benefits, the patient was deemed in                            satisfactory condition to undergo the procedure.                           After obtaining informed consent, the endoscope was  passed under direct vision. Throughout the                            procedure, the patient's blood pressure, pulse, and                            oxygen saturations were monitored continuously. The                            GIF [id number] was introduced through the                            mouth, and advanced to the second part of duodenum.                             The upper GI endoscopy was accomplished without                            difficulty. The patient tolerated the procedure                            fairly well. Scope In: Scope Out: Findings:                 One benign-appearing, intrinsic moderate                            (circumferential scarring or stenosis; an endoscope                            may pass) stenosis was found at the                            gastroesophageal junction. The stenosis was                            traversed. A TTS dilator was passed through the                            scope. Dilation with an 18-19-20 mm balloon dilator                            was performed to 20 mm. The dilation site was                            examined and showed mild mucosal disruption.                            Estimated blood loss was minimal. Biopsies were                            taken with a cold forceps for histology.                            Verification of  patient identification for the                            specimen was done. Estimated blood loss was minimal.                           A 5 cm hiatal hernia was present.                           The gastroesophageal flap valve was visualized                            endoscopically and classified as Hill Grade IV (no                            fold, wide open lumen, hiatal hernia present).                           Patchy mild inflammation characterized by erythema                            and granularity was found in the entire duodenum.                            Biopsies were taken with a cold forceps for                            histology. Verification of patient identification                            for the specimen was done. Estimated blood loss was                            minimal.                           The exam was otherwise without abnormality.                           The cardia and gastric fundus were normal on                             retroflexion. Complications:            No immediate complications. Estimated Blood Loss:     Estimated blood loss was minimal. Impression:               - Benign-appearing esophageal stenosis. Dilated.                            Biopsied.                           - 5 cm hiatal hernia.                           - Gastroesophageal  flap valve classified as Hill                            Grade IV (no fold, wide open lumen, hiatal hernia                            present).                           - Duodenitis. Biopsied.                           - The examination was otherwise normal.                           Vomited small amount of bloody fluid on way to                            recovery - awakened and no issues after that Recommendation:           - Patient has a contact number available for                            emergencies. The signs and symptoms of potential                            delayed complications were discussed with the                            patient. Return to normal activities tomorrow.                            Written discharge instructions were provided to the                            patient.                           - Clear liquids x 1 hour then soft foods rest of                            day. Start prior diet tomorrow.                           - Continue present medications.                           - Await pathology results. consider different PPI                            vs BID ? if cough related to GERD not clear - i did                            not see signs of LPR in pharynx/larynx Gatha Mayer, MD 09/05/2020 12:11:10 PM This report has been signed electronically.

## 2020-09-05 NOTE — Progress Notes (Signed)
Called to room to assist during endoscopic procedure.  Patient ID and intended procedure confirmed with present staff. Received instructions for my participation in the procedure from the performing physician.  

## 2020-09-05 NOTE — Progress Notes (Signed)
To PACU, VSS. Report to [email or web address] 

## 2020-09-07 ENCOUNTER — Telehealth: Payer: Self-pay | Admitting: *Deleted

## 2020-09-07 NOTE — Telephone Encounter (Signed)
  Follow up Call-  Call back number 09/05/2020  Post procedure Call Back phone  # [phone]  Permission to leave phone message Yes  Some recent data might be hidden     Patient questions:  Do you have a fever, pain , or abdominal swelling? No. Pain Score  0 *  Have you tolerated food without any problems? Yes.    Have you been able to return to your normal activities? Yes.    Do you have any questions about your discharge instructions: Diet   No. Medications  No. Follow up visit  No.  Do you have questions or concerns about your Care? No.  Actions: * If pain score is 4 or above: No action needed, pain <4.  Have you developed a fever since your procedure? no  2.   Have you had an respiratory symptoms (SOB or cough) since your procedure? no  3.   Have you tested positive for COVID 19 since your procedure no  4.   Have you had any family members/close contacts diagnosed with the COVID 19 since your procedure?  no   If yes to any of these questions please route to Joylene John, RN and Joella Prince, RN

## 2020-09-07 NOTE — Telephone Encounter (Signed)
Left message on f/u call 

## 2020-09-13 ENCOUNTER — Other Ambulatory Visit: Payer: Self-pay

## 2020-09-13 ENCOUNTER — Encounter: Payer: Self-pay | Admitting: Oncology

## 2020-09-13 ENCOUNTER — Other Ambulatory Visit (HOSPITAL_COMMUNITY): Payer: Self-pay

## 2020-09-13 MED ORDER — OMEPRAZOLE 40 MG PO CPDR
40.0000 mg | DELAYED_RELEASE_CAPSULE | Freq: Two times a day (BID) | ORAL | 2 refills | Status: DC
  Filled 2020-09-13 – 2020-10-04 (×2): qty 60, 30d supply, fill #0
  Filled 2020-11-14 – 2020-12-24 (×3): qty 60, 30d supply, fill #1
  Filled 2021-01-23: qty 60, 30d supply, fill #2

## 2020-09-14 ENCOUNTER — Other Ambulatory Visit (HOSPITAL_COMMUNITY): Payer: Self-pay

## 2020-09-20 ENCOUNTER — Other Ambulatory Visit (HOSPITAL_COMMUNITY): Payer: Self-pay

## 2020-09-20 MED ORDER — TEMAZEPAM 15 MG PO CAPS
ORAL_CAPSULE | ORAL | 0 refills | Status: DC
  Filled 2020-09-20: qty 30, 30d supply, fill #0
  Filled 2020-10-04: qty 60, 30d supply, fill #0

## 2020-09-27 ENCOUNTER — Other Ambulatory Visit (HOSPITAL_COMMUNITY): Payer: Self-pay

## 2020-10-04 ENCOUNTER — Encounter: Payer: Self-pay | Admitting: Oncology

## 2020-10-04 ENCOUNTER — Other Ambulatory Visit (HOSPITAL_COMMUNITY): Payer: Self-pay

## 2020-10-04 MED ORDER — HYDROCODONE BIT-HOMATROP MBR 5-1.5 MG/5ML PO SOLN
ORAL | 0 refills | Status: DC
  Filled 2020-10-04: qty 120, 6d supply, fill #0

## 2020-10-18 ENCOUNTER — Encounter: Payer: Self-pay | Admitting: Oncology

## 2020-10-18 ENCOUNTER — Other Ambulatory Visit (HOSPITAL_COMMUNITY): Payer: Self-pay

## 2020-10-22 ENCOUNTER — Telehealth: Payer: Self-pay

## 2020-10-22 NOTE — Telephone Encounter (Signed)
Notified Patient of completion of Attending 60 Statement for Disability Claim. Fax Transmission Confirmation received and copy mailed to Patient as requested.

## 2020-10-24 ENCOUNTER — Other Ambulatory Visit: Payer: Self-pay | Admitting: *Deleted

## 2020-10-24 DIAGNOSIS — C50312 Malignant neoplasm of lower-inner quadrant of left female breast: Secondary | ICD-10-CM

## 2020-10-24 DIAGNOSIS — Z170 Estrogen receptor positive status [ER+]: Secondary | ICD-10-CM

## 2020-10-25 ENCOUNTER — Other Ambulatory Visit (HOSPITAL_COMMUNITY): Payer: Self-pay

## 2020-10-25 ENCOUNTER — Inpatient Hospital Stay: Payer: 59

## 2020-10-25 ENCOUNTER — Inpatient Hospital Stay: Payer: 59 | Attending: Oncology | Admitting: Oncology

## 2020-10-25 ENCOUNTER — Other Ambulatory Visit: Payer: Self-pay

## 2020-10-25 VITALS — BP 146/85 | HR 92 | Temp 97.7°F | Resp 18 | Ht 62.0 in | Wt 135.9 lb

## 2020-10-25 DIAGNOSIS — Z885 Allergy status to narcotic agent status: Secondary | ICD-10-CM | POA: Insufficient documentation

## 2020-10-25 DIAGNOSIS — Z560 Unemployment, unspecified: Secondary | ICD-10-CM | POA: Insufficient documentation

## 2020-10-25 DIAGNOSIS — Z170 Estrogen receptor positive status [ER+]: Secondary | ICD-10-CM | POA: Diagnosis not present

## 2020-10-25 DIAGNOSIS — K449 Diaphragmatic hernia without obstruction or gangrene: Secondary | ICD-10-CM | POA: Diagnosis not present

## 2020-10-25 DIAGNOSIS — C50312 Malignant neoplasm of lower-inner quadrant of left female breast: Secondary | ICD-10-CM | POA: Insufficient documentation

## 2020-10-25 DIAGNOSIS — Z90721 Acquired absence of ovaries, unilateral: Secondary | ICD-10-CM | POA: Diagnosis not present

## 2020-10-25 DIAGNOSIS — G629 Polyneuropathy, unspecified: Secondary | ICD-10-CM | POA: Insufficient documentation

## 2020-10-25 LAB — CBC WITH DIFFERENTIAL (CANCER CENTER ONLY)
Abs Immature Granulocytes: 0.01 10*3/uL (ref 0.00–0.07)
Basophils Absolute: 0 10*3/uL (ref 0.0–0.1)
Basophils Relative: 0 %
Eosinophils Absolute: 0.1 10*3/uL (ref 0.0–0.5)
Eosinophils Relative: 1 %
HCT: 41.3 % (ref 36.0–46.0)
Hemoglobin: 14.1 g/dL (ref 12.0–15.0)
Immature Granulocytes: 0 %
Lymphocytes Relative: 27 %
Lymphs Abs: 2 10*3/uL (ref 0.7–4.0)
MCH: 30.7 pg (ref 26.0–34.0)
MCHC: 34.1 g/dL (ref 30.0–36.0)
MCV: 89.8 fL (ref 80.0–100.0)
Monocytes Absolute: 0.5 10*3/uL (ref 0.1–1.0)
Monocytes Relative: 7 %
Neutro Abs: 4.7 10*3/uL (ref 1.7–7.7)
Neutrophils Relative %: 65 %
Platelet Count: 250 10*3/uL (ref 150–400)
RBC: 4.6 MIL/uL (ref 3.87–5.11)
RDW: 13.4 % (ref 11.5–15.5)
WBC Count: 7.3 10*3/uL (ref 4.0–10.5)
nRBC: 0 % (ref 0.0–0.2)

## 2020-10-25 MED ORDER — GABAPENTIN 300 MG PO CAPS
600.0000 mg | ORAL_CAPSULE | Freq: Every day | ORAL | 6 refills | Status: DC
  Filled 2020-10-25: qty 120, 60d supply, fill #0
  Filled 2020-12-16 – 2020-12-24 (×2): qty 120, 60d supply, fill #1
  Filled 2021-01-23 – 2021-02-07 (×3): qty 120, 60d supply, fill #2
  Filled 2021-05-08: qty 120, 60d supply, fill #3
  Filled 2021-07-15: qty 120, 60d supply, fill #4

## 2020-10-25 NOTE — Progress Notes (Signed)
[name]  [phone] [phone]    ID: Kathy Barker DOB: [birth year]  MR#: [id number]  WLN#:[id number]  Patient Care Team: Kathy Barker, Barker as PCP - General (Family Medicine) Kathy Barker, Barker as Consulting Physician (General Surgery) Kathy Barker, Kathy Barker, Barker as Consulting Physician (Oncology) Kathy Barker, Barker as Attending Physician (Radiation Oncology) Kathy Barker, Barker as Consulting Physician (Dermatology) Kathy Barker, Barker OTHER Barker:   CHIEF COMPLAINT: Left-sided breast cancer  CURRENT TREATMENT: observation   INTERVAL HISTORY: Kathy Barker returns today for follow up of her left-sided breast cancer. She is now under observation.  Of note since her last visit, she underwent upper endoscopy on 09/05/20 under Dr. 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  Biopsies were obtained from the esophagus and the duodenum. Pathology [id number]) results were negative for malignancy.   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  The feet however are a problem.  They burn at night.  Gabapentin at 300 mg at bedtime is not helping much.  They are not uncomfortable during the day but she "does not trust them".  She for all in July and August and has had some stumbles since.  She does not feel so much dizzy as lightheaded and there is no vertigo symptomatology. Otherwise she is delighted that her hair has come in full and dark.  She actually has put in some highlights just to lighten it up a little.  She has not yet been able to return to work because of the foot problem but she is hoping to be able to before the end of the year   [name]: She has had both immunizations, most recently April 2021   HISTORY OF CURRENT ILLNESS: From the original intake note:  Kathy Barker  herself noted a palpable lower-inner left breast lump and immediately brought it to medical attention.. She underwent bilateral diagnostic mammography with tomography and left breast ultrasonography at [facility] on 05/30/2019 showing: breast density category B; 1.7 cm lobulated mass in left breast at 8 o'clock; no significant left axillary abnormalities.  Accordingly on 06/01/2019 she proceeded to biopsy of the left breast area in question. The pathology from this procedure ([id number]) showed: invasive ductal carcinoma, grade 3. Prognostic indicators significant for: estrogen receptor, 70% positive with weak staining intensity and progesterone receptor, 0% negative. Proliferation marker Ki67 at 80%. HER2 negative by immunohistochemistry (1+).  The patient's subsequent history is as detailed below.   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 by Dr Rayann Heman    PAST SURGICAL HISTORY: Past Surgical History:  Procedure Laterality Date   BREAST LUMPECTOMY WITH RADIOACTIVE SEED AND SENTINEL LYMPH NODE BIOPSY Left 12/14/2019   Procedure: LEFT BREAST LUMPECTOMY X 2 WITH RADIOACTIVE SEED AND SENTINEL LYMPH  NODE BIOPSY;  Surgeon: Kathy Barker, Barker;  Location: [place];  Service: General;  Laterality: Left;  PEC BLOCK   CESAREAN SECTION  01/13/1990   COLOSTOMY     COMBINED HYSTERECTOMY VAGINAL / OOPHORECTOMY / A&Barker REPAIR  01/13/1997   IR IMAGING GUIDED PORT INSERTION  06/30/2019   IR  REMOVAL TUN ACCESS W/ PORT W/O FL MOD SED  05/24/2020   SUPRAVENTRICULAR TACHYCARDIA ABLATION  11/18/2012   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   SUPRAVENTRICULAR TACHYCARDIA ABLATION N/A 11/18/2012   Procedure: SUPRAVENTRICULAR TACHYCARDIA ABLATION;  Surgeon: Coralyn Mark, Barker;  Location: [facility] CATH LAB;  Service: Cardiovascular;  Laterality: N/A;   UPPER GASTROINTESTINAL ENDOSCOPY     VAGINAL HYSTERECTOMY  ~ 2000    FAMILY HISTORY: Family History  Problem Relation Age of Onset   Breast cancer Mother    Heart attack Father    Alcohol abuse Father    Heart attack Paternal Grandfather    Heart Problems Other        both sides of family   Rectal cancer Neg Hx    Stomach cancer Neg Hx    Colon cancer Neg Hx    Her father died at age 42 from alcohol abuse. Her mother is living at age 51 (as of 05/2019) and has a history of DCIS at age 16 and invasive lobular breast cancer at age 85. Kathy Barker has one brother. She reports cancer of an unknown type in her maternal grandmother and prostate cancer in a maternal uncle.   GYNECOLOGIC HISTORY:  No LMP recorded. Patient has had a hysterectomy. Menarche: 60 years old Age at first live birth: 60 years old Kathy Barker 2 LMP 2000 Contraceptive: never used HRT never used  Hysterectomy? yes BSO? 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  She lost her job during her chemo treatments.  She is divorced. She lives at home with son Kathy Barker, age 72, who works in Biomedical scientist in [place] and is working towards a business degree. Son Kathy Barker, age 31, works in Sales executive here in [place] and just recently moved to [place] to start a cryptocurrency business.  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     ADVANCED DIRECTIVES: Not in place. She intends to name both of her sons as her 73.   HEALTH MAINTENANCE: Social History   Tobacco Use   Smoking  status: Never   Smokeless tobacco: Never  Vaping Use   Vaping Use: Never used  Substance Use Topics   Alcohol use: Yes    Comment: 11/18/2012 "shot of brandy couple times/month"   Drug use: No     GI: Upper endoscopy 09/05/2020  PAP: date unsure  Bone density: never done   Allergies  Allergen Reactions   Fentanyl Nausea And Vomiting    Per patient " extreme nausea and vomiting "    Other Other (See Comments)    Problems with stitches   Simvastatin Other (See Comments)   Venlafaxine Other (See Comments)   Versed [Midazolam] Nausea And Vomiting    Per patient " extreme N/V"    Latex Rash    Current Outpatient Medications  Medication Sig Dispense Refill   acyclovir (ZOVIRAX) 400 MG tablet TAKE 2 TABLETS BY MOUTH 3 TIMES A DAY FOR 10 DAYS THEN DECREASE TO 1 TABLET 2 TIMES A DAY 120 tablet 3   gabapentin (NEURONTIN) 300 MG  capsule Take 1 capsule (300 mg total) by mouth at bedtime. 90 capsule 4   HYDROcodone bit-homatropine (HYCODAN) 5-1.5 MG/5ML syrup Take 5 mL by mouth every 6 hours as needed. Take 1 hour before a meal as directed. 120 mL 0   HYDROcodone bit-homatropine (HYDROMET) 5-1.5 MG/5ML syrup Take 5 mLs by mouth every 6 (six) hours as needed for cough. 120 mL 0   HYDROMET 5-1.5 MG/5ML syrup every 4 (four) hours as needed.     omeprazole (PRILOSEC) 40 MG capsule Take 1 capsule (40 mg total) by mouth in the morning and at bedtime. 60 capsule 2   sertraline (ZOLOFT) 100 MG tablet Take 100 mg by mouth daily.     temazepam (RESTORIL) 15 MG capsule Take 15 mg by mouth at bedtime.     temazepam (RESTORIL) 15 MG capsule Take 1 to 2 capsules by mouth at bedtime as needed 60 capsule 0   No current facility-administered medications for this visit.    OBJECTIVE: White woman in no acute distress Vitals:   10/25/20 1556  BP: (!) 146/85  Pulse: 92  Resp: 18  Temp: 97.7 F (36.5 C)  SpO2: 98%     Body mass index is 24.86 kg/m.   Wt Readings from Last 3 Encounters:  10/25/20  135 lb 14.4 oz (61.6 kg)  09/05/20 139 lb (63 kg)  08/29/20 139 lb 4 oz (63.2 kg)     ECOG FS:1 - Symptomatic but completely ambulatory  Sclerae unicteric, EOMs intact Wearing a mask No cervical or supraclavicular adenopathy Lungs no rales or rhonchi Heart regular rate and rhythm Abd soft, nontender, positive bowel sounds MSK no focal spinal tenderness, no upper extremity lymphedema Neuro: nonfocal, well oriented, appropriate affect Breasts: The right breast is benign per the left breast is status postlumpectomy and radiation.  There is no evidence of local recurrence.  Both axillae are benign.   LAB RESULTS:  CMP     Component Value Date/Time   NA 143 04/12/2020 1404   K 3.7 04/12/2020 1404   CL 108 04/12/2020 1404   CO2 24 04/12/2020 1404   GLUCOSE 147 (H) 04/12/2020 1404   BUN 13 04/12/2020 1404   CREATININE 0.77 04/12/2020 1404   CALCIUM 9.0 04/12/2020 1404   PROT 6.8 04/12/2020 1404   ALBUMIN 4.0 04/12/2020 1404   AST 25 04/12/2020 1404   ALT 35 04/12/2020 1404   ALKPHOS 149 (H) 04/12/2020 1404   BILITOT 0.3 04/12/2020 1404   GFRNONAA >60 04/12/2020 1404   GFRAA >60 10/04/2019 1241    No results found for: TOTALPROTELP, ALBUMINELP, A1GS, A2GS, BETS, BETA2SER, GAMS, MSPIKE, SPEI  Lab Results  Component Value Date   WBC 7.3 10/25/2020   NEUTROABS 4.7 10/25/2020   HGB 14.1 10/25/2020   HCT 41.3 10/25/2020   MCV 89.8 10/25/2020   PLT 250 10/25/2020    No results found for: LABCA2  No components found for: [id number]  No results for input(s): INR in the last 168 hours.  No results found for: LABCA2  No results found for: XTG626  No results found for: RSW546  No results found for: EVO350  No results found for: CA2729  No components found for: HGQUANT  No results found for: CEA1 / No results found for: CEA1   No results found for: AFPTUMOR  No results found for: CHROMOGRNA  No results found for: KPAFRELGTCHN, LAMBDASER,  KAPLAMBRATIO (kappa/lambda light chains)  No results found for: HGBA, HGBA2QUANT, HGBFQUANT, HGBSQUAN (Hemoglobinopathy evaluation)   No  results found for: LDH  No results found for: IRON, TIBC, IRONPCTSAT (Iron and TIBC)  No results found for: FERRITIN  Urinalysis No results found for: COLORURINE, APPEARANCEUR, LABSPEC, PHURINE, GLUCOSEU, HGBUR, BILIRUBINUR, KETONESUR, PROTEINUR, UROBILINOGEN, NITRITE, LEUKOCYTESUR   STUDIES: No results found.    ELIGIBLE FOR AVAILABLE RESEARCH PROTOCOL: no  ASSESSMENT: 60 y.o. [place] woman status post left breast lower inner quadrant biopsy 06/01/2019 for a clinical T1c N0, stage IB invasive ductal carcinoma, grade 3, weakly estrogen receptor positive but functionally triple negative, with an MIB-1 of 80%.  (1) Oncotype score of 51 predicts a risk of recurrence outside the breast in the next 9 years of greater than 39% if the patient's only systemic therapy is antiestrogens for 5 years.  It also predicts a greater than 15% benefit from chemotherapy  (2) neoadjuvant chemotherapy consisting of doxorubicin and cyclophosphamide in dose dense fashion x4 started 06/30/2019, completed 08/23/2019, followed by weekly paclitaxel and carboplatin weekly x12 starting 09/13/2019, discontinued after 10/04/2019 dose  (a) carboplatin/paclitaxel discontinued after 3 doses with neuropathy developing  (b) repeat breast MRI 10/24/2019 shows resolution of the known malignancy  (3) Left lumpectomy on 12/14/2019 shows residual 0.3 cm invasive ductal carcinoma, margins negative, 3 sentinel lymph nodes negative  (a) repeat prognostic panel triple negative with an Mib-1 of 65%  (4) adjuvant radiation 01/24/2020 through 02/24/2020 Site Technique Total Dose (Gy) Dose per Fx (Gy) Completed Fx Beam Energies  Breast, Left: Breast_Lt 3D 40.05/40.05 2.67 15/15 6X, 10X  Breast, Left: Breast_Lt_Bst 3D 10/10 2 5/5 6X, 10X     PLAN: Paulina is coming up in a year from definitive  surgery for her breast cancer with no evidence of disease recurrence.  This is favorable.  The biggest problem she is having is continuing neuropathy.  The hands are better but the feet are a real issue.  We are going to increase the nighttime gabapentin to 600 milligrams.  Hopefully that will control the "burning sensation" she has at night and help her sleep through the night.  If she feels groggy in the morning though we will have to back off on that drug.  I am hopeful with time there will be further improvement but in some cases this is a chronic residual of her chemotherapy treatments  I think she will benefit from rehab in terms of balance and falls and I have placed that referral for her.  She is due for her new baseline mammogram and I have entered that order.  She will also have a baseline bone density.  She will then return to see [place] in about 3 months and at that point we may consider beginning to lengthen the follow-up interval to 6 months  Total encounter time 25 minutes.Sarajane Jews C. Avari Gelles, Barker 10/25/20 4:33 PM Medical Oncology and Hematology [facility] [place], [place] [address] Tel. [phone]    Fax. [phone]   I, Wilburn Mylar, am acting as scribe for Dr. Virgie Barker. Jarrah Seher.  I, Kathy Barker, have reviewed the above documentation for accuracy and completeness, and I agree with the above.   *Total Encounter Time as defined by the Centers for Medicare and Medicaid Services includes, in addition to the face-to-face time of a patient visit (documented in the note above) non-face-to-face time: obtaining and reviewing outside history, ordering and reviewing medications, tests or procedures, care coordination (communications with other health care professionals or caregivers) and documentation in the medical record.

## 2020-10-26 LAB — CMP (CANCER CENTER ONLY)
ALT: 47 U/L — ABNORMAL HIGH (ref 0–44)
AST: 36 U/L (ref 15–41)
Albumin: 4.7 g/dL (ref 3.5–5.0)
Alkaline Phosphatase: 124 U/L (ref 38–126)
Anion gap: 12 (ref 5–15)
BUN: 9 mg/dL (ref 8–23)
CO2: 23 mmol/L (ref 22–32)
Calcium: 9.8 mg/dL (ref 8.9–10.3)
Chloride: 103 mmol/L (ref 98–111)
Creatinine: 0.83 mg/dL (ref 0.44–1.00)
GFR, Estimated: >60 mL/min (ref >60)
Glucose, Bld: 96 mg/dL (ref 70–99)
Potassium: 3.8 mmol/L (ref 3.5–5.1)
Sodium: 138 mmol/L (ref 135–145)
Total Bilirubin: 0.5 mg/dL (ref 0.3–1.2)
Total Protein: 7.7 g/dL (ref 6.5–8.1)

## 2020-11-08 ENCOUNTER — Other Ambulatory Visit (HOSPITAL_COMMUNITY): Payer: Self-pay

## 2020-11-13 ENCOUNTER — Other Ambulatory Visit (HOSPITAL_COMMUNITY): Payer: Self-pay

## 2020-11-14 ENCOUNTER — Other Ambulatory Visit (HOSPITAL_COMMUNITY): Payer: Self-pay

## 2020-11-15 ENCOUNTER — Other Ambulatory Visit (HOSPITAL_COMMUNITY): Payer: Self-pay

## 2020-11-21 ENCOUNTER — Ambulatory Visit: Payer: 59 | Admitting: Internal Medicine

## 2020-11-22 ENCOUNTER — Other Ambulatory Visit (HOSPITAL_COMMUNITY): Payer: Self-pay

## 2020-11-23 ENCOUNTER — Ambulatory Visit: Payer: 59 | Attending: Oncology | Admitting: Physical Therapy

## 2020-11-23 ENCOUNTER — Other Ambulatory Visit: Payer: Self-pay

## 2020-11-23 ENCOUNTER — Encounter: Payer: Self-pay | Admitting: Physical Therapy

## 2020-11-23 DIAGNOSIS — R262 Difficulty in walking, not elsewhere classified: Secondary | ICD-10-CM | POA: Diagnosis present

## 2020-11-23 DIAGNOSIS — R278 Other lack of coordination: Secondary | ICD-10-CM | POA: Insufficient documentation

## 2020-11-23 DIAGNOSIS — M6281 Muscle weakness (generalized): Secondary | ICD-10-CM | POA: Insufficient documentation

## 2020-11-23 DIAGNOSIS — R296 Repeated falls: Secondary | ICD-10-CM | POA: Insufficient documentation

## 2020-11-23 DIAGNOSIS — C50312 Malignant neoplasm of lower-inner quadrant of left female breast: Secondary | ICD-10-CM | POA: Insufficient documentation

## 2020-11-23 DIAGNOSIS — Z170 Estrogen receptor positive status [ER+]: Secondary | ICD-10-CM | POA: Insufficient documentation

## 2020-11-23 NOTE — Therapy (Signed)
[facility] @ [address] [place], [place], [address] Phone: [phone]   Fax:  [phone]  Physical Therapy Evaluation  Patient Details  Name: Kathy Barker MRN: [id number] Date of Birth: [birth year] Referring Provider (PT): Dr. Jana Hakim   Encounter Date: 11/23/2020   PT End of Session - 11/23/20 1209     Visit Number 1    Date for PT Re-Evaluation 01/18/21    Authorization Type [employer]    PT Start Time 1103    PT Stop Time 1150    PT Time Calculation (min) 47 min    Activity Tolerance Patient tolerated treatment well             Past Medical History:  Diagnosis Date   Allergic rhinitis    Anxiety    "only during my divorce" (11/18/2012)   Cancer ([place])    Depression    "only during my divorce" (11/18/2012)   GERD (gastroesophageal reflux disease)    High cholesterol    Hot flashes    IBS (irritable bowel syndrome)    Insomnia    Migraines    "lately more often; before SVT I'd get them ~ q 6 months" (11/18/2012)   PONV (postoperative nausea and vomiting)    SVT (supraventricular tachycardia) (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 by Dr Rayann Heman    Past Surgical History:  Procedure Laterality Date   BREAST LUMPECTOMY WITH RADIOACTIVE SEED AND SENTINEL LYMPH NODE BIOPSY Left 12/14/2019   Procedure: LEFT BREAST LUMPECTOMY X 2 WITH RADIOACTIVE SEED AND SENTINEL LYMPH NODE BIOPSY;  Surgeon: [name], MD;  Location: [facility];  Service: General;  Laterality: Left;  PEC BLOCK   CESAREAN SECTION  01/13/1990   COLOSTOMY     COMBINED HYSTERECTOMY VAGINAL / OOPHORECTOMY / A&P REPAIR  01/13/1997   IR IMAGING GUIDED PORT INSERTION  06/30/2019   IR REMOVAL TUN ACCESS W/ PORT W/O FL MOD SED  05/24/2020   SUPRAVENTRICULAR TACHYCARDIA ABLATION  11/18/2012   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   SUPRAVENTRICULAR TACHYCARDIA ABLATION N/A 11/18/2012   Procedure:  SUPRAVENTRICULAR TACHYCARDIA ABLATION;  Surgeon: Coralyn Mark, MD;  Location: [facility] CATH LAB;  Service: Cardiovascular;  Laterality: N/A;   UPPER GASTROINTESTINAL ENDOSCOPY     VAGINAL HYSTERECTOMY  ~ 2000    There were no vitals filed for this visit.    Subjective Assessment - 11/23/20 1107     Subjective Breast cancer treatment summer 2021 until November.  Surgery in December.  Radiation until February.  Diagnosed with neuropathy.  Initially in hands, now in feet.  Started to fall 2x.  Walking across grass and fell left ankle fx.   Wore a boot.  [place] a 2nd time stepping up on curb and busted mouth.  Fearful going up and down stairs.  Fearful of taking the trash out especially when cats are out.  Fearful of walking on leaves.  Slipping shower.  I'm better off with no shoes.    Pertinent History history of breast cancer    Limitations House hold activities;Walking;Standing    Patient Stated Goals Be able to feel more comfortable so I can go back to my job and not feel like I'm going fall;  more stability    Currently in Pain? No/denies    Pain Score 0-No pain    Aggravating Factors  stairs, uneven terrain  [facility] PT Assessment - 11/23/20 0001       Assessment   Medical Diagnosis breast cancer, neuropathy, falls    Referring Provider (PT) Dr. Jana Hakim    Onset Date/Surgical Date --   1 year   Next MD Visit as needed    Prior Therapy for cancer rehab      Precautions   Precautions Fall      Restrictions   Weight Bearing Restrictions No      Balance Screen   Has the patient fallen in the past 6 months Yes    How many times? 2    Has the patient had a decrease in activity level because of a fear of falling?  Yes    Is the patient reluctant to leave their home because of a fear of falling?  No      Home Ecologist residence    Home Access Stairs to enter    [place] Two level      Prior Function   Leisure travel-son in  [place]      Observation/Other Assessments   Activities of Balance Confidence Scale (ABC Scale)  47.5%      AROM   Overall AROM Comments UE/LE ROM WFLs      Strength   Overall Strength Comments LEs grossly 4 to 4+/5;  able to rise from a chair without UE support      Berg Balance Test   Sit to Stand Able to stand without using hands and stabilize independently    Standing Unsupported Able to stand safely 2 minutes    Sitting with Back Unsupported but Feet Supported on Floor or Stool Able to sit safely and securely 2 minutes    Stand to Sit Sits safely with minimal use of hands    Transfers Able to transfer safely, minor use of hands    Standing Unsupported with Eyes Closed Able to stand 10 seconds with supervision    Standing Unsupported with Feet Together Able to place feet together independently and stand 1 minute safely    From Standing, Reach Forward with Outstretched Arm Can reach confidently >25 cm (10")    From Standing Position, Pick up Object from Floor Able to pick up shoe, needs supervision    From Standing Position, Turn to Look Behind Over each Shoulder Looks behind from both sides and weight shifts well    Turn 360 Degrees Able to turn 360 degrees safely in 4 seconds or less    Standing Unsupported, Alternately Place Feet on Step/Stool Able to stand independently and safely and complete 8 steps in 20 seconds    Standing Unsupported, One Foot in Front Able to plae foot ahead of the other independently and hold 30 seconds    Standing on One Leg Able to lift leg independently and hold 5-10 seconds    Total Score 52      Functional Gait  Assessment   Gait Level Surface Walks 20 ft in less than 5.5 sec, no assistive devices, good speed, no evidence for imbalance, normal gait pattern, deviates no more than 6 in outside of the 12 in walkway width.    Change in Gait Speed Able to smoothly change walking speed without loss of balance or gait deviation. Deviate no more than 6 in  outside of the 12 in walkway width.    Gait with Horizontal Head Turns Performs head turns smoothly with no change in gait. Deviates no more than 6  in outside 12 in walkway width    Gait with Vertical Head Turns Performs head turns with no change in gait. Deviates no more than 6 in outside 12 in walkway width.    Gait and Pivot Turn Pivot turns safely within 3 sec and stops quickly with no loss of balance.    Step Over Obstacle Is able to step over 2 stacked shoe boxes taped together (9 in total height) without changing gait speed. No evidence of imbalance.    Gait with Narrow Base of Support Ambulates 7-9 steps.    Gait with Eyes Closed Walks 20 ft, uses assistive device, slower speed, mild gait deviations, deviates 6-10 in outside 12 in walkway width. Ambulates 20 ft in less than 9 sec but greater than 7 sec.    Ambulating Backwards Walks 20 ft, uses assistive device, slower speed, mild gait deviations, deviates 6-10 in outside 12 in walkway width.    Steps Alternating feet, must use rail.    Total Score 26                        Objective measurements completed on examination: See above findings.                PT Education - 11/23/20 1201     Education Details basic strength and balance    Person(s) Educated Patient    Methods Explanation;Demonstration;Handout    Comprehension Returned demonstration;Verbalized understanding              PT Short Term Goals - 11/23/20 1226       PT SHORT TERM GOAL #1   Title The patient will demonstrate compliance with basic balance and strengthening    Time 4    Period Weeks    Status New    Target Date 12/21/20      PT SHORT TERM GOAL #2   Title The patient will be able to descend steps reciprocally with 1 UE railing for support    Time 4    Period Weeks    Status New      PT SHORT TERM GOAL #3   Title ABC scale score improved to 57%    Time 4    Period Weeks    Status New               PT Long  Term Goals - 11/23/20 1228       PT LONG TERM GOAL #1   Title The patient will be independent in a safe self progression of HEP    Time 8    Period Weeks    Status New    Target Date 01/18/21      PT LONG TERM GOAL #2   Title The patient will have improved Functional Gait Assessment score to 29/30    Time 8    Period Weeks    Status New      PT LONG TERM GOAL #3   Title ABC Scale score improved to 62%    Time 8    Period Days    Status New      PT LONG TERM GOAL #4   Title BERG balance test score improved to 54/56    Time 8    Period Weeks    Status New      PT LONG TERM GOAL #5   Title Lower extremity strength including ankle musculature needed for balance improved to 4+/5 to 5/5.  Time 8    Period Weeks    Status New                    Plan - 11/23/20 1210     Clinical Impression Statement The patient is referred to PT for evaluation and treatment for balance and falls related to neuropathy from breast cancer treatment.  She has had 2 major falls resulting in an ankle fracture and injury to her mouth and teeth.  These falls occurred while walking on uneven terrain (grass) and while stepping up on a curb.  She also reports fearfulness walking fast, going up and down steps at home, walking outdoor on fallen leaves, and squatting to retrieve something from the floor.  Her  BERG score is 52/56 indicates a mild impairment with statis balance.  Functional Gait Assessment for dynamic balance score is 26/30 with impairments with narrow base of support walking, walking backwards, descending stairs and walking without visual input.  Activities Specific Balance Confidence Scale is 47.5% indication low to medium confidence in her balance with community mobility.    Personal Factors and Comorbidities Comorbidity 1    Examination-Activity Limitations Locomotion Level;Stairs;Lift;Squat    Examination-Participation Restrictions Community Activity;Yard Work;Occupation;Shop     Stability/Clinical Decision Making Stable/Uncomplicated    Clinical Decision Making Low    Rehab Potential Good    PT Frequency 2x / week    PT Duration 8 weeks    PT Treatment/Interventions ADLs/Self Care Home Management;Therapeutic activities;Therapeutic exercise;Balance training;Neuromuscular re-education;Manual techniques;Patient/family education    PT Next Visit Plan dynamic balance particularly walking outdoors uneven terrain; up and down steps (especially down); squatting; eyes closed, backwards, wall pull aways for anterior tib activation; walking fast;  patient education on how to fall protection strategies to limit injury    PT Home Exercise Plan [email or web address]    Consulted and Agree with Plan of Care Patient             Patient will benefit from skilled therapeutic intervention in order to improve the following deficits and impairments:  Difficulty walking, Decreased balance, Decreased strength  Visit Diagnosis: Repeated falls - Plan: PT plan of care cert/re-cert  Other lack of coordination - Plan: PT plan of care cert/re-cert  Muscle weakness (generalized) - Plan: PT plan of care cert/re-cert  Difficulty in walking, not elsewhere classified - Plan: PT plan of care cert/re-cert     Problem List Patient Active Problem List   Diagnosis Date Noted   Port-A-Cath in place 06/30/2019   Malignant neoplasm of lower-inner quadrant of left breast in female, estrogen receptor positive ([place]) 06/06/2019   SVT (supraventricular tachycardia) ([place]) 10/28/2012   Other specified cardiac dysrhythmias(427.89) 10/26/2012   Anxiety    Allergic rhinitis    Insomnia    Hot flashes    GERD (gastroesophageal reflux disease)    Depression    IBS (irritable bowel syndrome)    Ruben Im, PT 11/23/20 12:34 PM Phone: [phone] Fax: [phone]  Alvera Singh, PT 11/23/2020, 12:34 PM  [facility] @ [address] [place], [place], [address] Phone: [phone]   Fax:  [phone]  Name: Kathy Barker MRN: [id number] Date of Birth: Nov 29, 1960

## 2020-11-23 NOTE — Patient Instructions (Signed)
Access Code: [email or web address] URL: [email or web address] Date: 11/23/2020 Prepared by: Ruben Im  Exercises Seated Toe Towel Scrunches - 1 x daily - 7 x weekly - 1 sets - 10 reps Sit to Stand - 1 x daily - 7 x weekly - 1 sets - 10 reps Standing Heel Raise with Support - 1 x daily - 7 x weekly - 1 sets - 10 reps Single Leg Stance - 1 x daily - 7 x weekly - 1 sets - 10 reps Tandem Stance with Support - 1 x daily - 7 x weekly - 1 sets - 10 reps

## 2020-11-28 ENCOUNTER — Telehealth: Payer: Self-pay

## 2020-11-28 NOTE — Telephone Encounter (Signed)
Patient notified of need for new signed Release of Information form for record request from [facility]. Copy of request for records forwarded to system wide Health Information Management with copy of current Release of Information form

## 2020-11-29 ENCOUNTER — Encounter: Payer: 59 | Admitting: Physical Therapy

## 2020-12-04 ENCOUNTER — Other Ambulatory Visit: Payer: Self-pay

## 2020-12-04 ENCOUNTER — Ambulatory Visit: Payer: 59 | Admitting: Physical Therapy

## 2020-12-04 DIAGNOSIS — R262 Difficulty in walking, not elsewhere classified: Secondary | ICD-10-CM

## 2020-12-04 DIAGNOSIS — R296 Repeated falls: Secondary | ICD-10-CM

## 2020-12-04 DIAGNOSIS — M6281 Muscle weakness (generalized): Secondary | ICD-10-CM

## 2020-12-04 DIAGNOSIS — R278 Other lack of coordination: Secondary | ICD-10-CM

## 2020-12-04 NOTE — Therapy (Signed)
[facility] @ [address] [place], [place], [address] Phone: [phone]   Fax:  [phone]  Physical Therapy Treatment  Patient Details  Name: Kathy Barker MRN: [id number] Date of Birth: 22-Nov-1960 Referring Provider (PT): Dr. Jana Hakim   Encounter Date: 12/04/2020   PT End of Session - 12/04/20 1139     Visit Number 2    Date for PT Re-Evaluation 01/18/21    Authorization Type [employer]    PT Start Time 1140    PT Stop Time 1220    PT Time Calculation (min) 40 min    Activity Tolerance Patient tolerated treatment well             Past Medical History:  Diagnosis Date   Allergic rhinitis    Anxiety    "only during my divorce" (11/18/2012)   Cancer ([place])    Depression    "only during my divorce" (11/18/2012)   GERD (gastroesophageal reflux disease)    High cholesterol    Hot flashes    IBS (irritable bowel syndrome)    Insomnia    Migraines    "lately more often; before SVT I'd get them ~ q 6 months" (11/18/2012)   PONV (postoperative nausea and vomiting)    SVT (supraventricular tachycardia) ([place])    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 by Dr Rayann Heman    Past Surgical History:  Procedure Laterality Date   BREAST LUMPECTOMY WITH RADIOACTIVE SEED AND SENTINEL LYMPH NODE BIOPSY Left 12/14/2019   Procedure: LEFT BREAST LUMPECTOMY X 2 WITH RADIOACTIVE SEED AND SENTINEL LYMPH NODE BIOPSY;  Surgeon: [name], MD;  Location: [facility];  Service: General;  Laterality: Left;  PEC BLOCK   CESAREAN SECTION  01/13/1990   COLOSTOMY     COMBINED HYSTERECTOMY VAGINAL / OOPHORECTOMY / A&P REPAIR  01/13/1997   IR IMAGING GUIDED PORT INSERTION  06/30/2019   IR REMOVAL TUN ACCESS W/ PORT W/O FL MOD SED  05/24/2020   SUPRAVENTRICULAR TACHYCARDIA ABLATION  11/18/2012   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   SUPRAVENTRICULAR TACHYCARDIA ABLATION N/A 11/18/2012   Procedure:  SUPRAVENTRICULAR TACHYCARDIA ABLATION;  Surgeon: Coralyn Mark, MD;  [facility] C[facility] LAB;  Service: Cardiovascular;  Laterality: N/A;   UPPER GASTROINTESTINAL ENDOSCOPY     VAGINAL HYSTERECTOMY  ~ 2000    There were no vitals filed for this visit.   Subjective Assessment - 12/04/20 1139     Subjective Everything is going OK.  I'm doing some things barefoot but I need to practice with shoes.    Patient Stated Goals Be able to feel more comfortable so I can go back to my job and not feel like I'm going fall;  more stability    Currently in Pain? No/denies    Pain Score 0-No pain                               [facility] Adult PT Treatment/Exercise - 12/04/20 0001       Self-Care   Self-Care Other Self-Care Comments    Other Self-Care Comments  emailed video on "how to fall"      Neuro Re-ed    Neuro Re-ed Details  stepping over hurdles forward and laterally, up and down steps carrying 3# weight; stepping and reach circles multi directional;  walking outdoors on paved surface and grass      Knee/Hip  Exercises: Aerobic   Nustep 8 min while discussing status      Knee/Hip Exercises: Standing   Heel Raises Limitations towel roll forward/back rocking    Other Standing Knee Exercises butt off wall drill 15x    Other Standing Knee Exercises forward and backward balance perturbances      Ankle Exercises: Standing   Other Standing Ankle Exercises wall fall on open hand                     PT Education - 12/04/20 1721     Education Details video on "how to fall" emailed    Person(s) Educated Patient    Methods Other (comment)              PT Short Term Goals - 11/23/20 1226       PT SHORT TERM GOAL #1   Title The patient will demonstrate compliance with basic balance and strengthening    Time 4    Period Weeks    Status New    Target Date 12/21/20      PT SHORT TERM GOAL #2   Title The patient will be able to descend steps reciprocally  with 1 UE railing for support    Time 4    Period Weeks    Status New      PT SHORT TERM GOAL #3   Title ABC scale score improved to 57%    Time 4    Period Weeks    Status New               PT Long Term Goals - 11/23/20 1228       PT LONG TERM GOAL #1   Title The patient will be independent in a safe self progression of HEP    Time 8    Period Weeks    Status New    Target Date 01/18/21      PT LONG TERM GOAL #2   Title The patient will have improved Functional Gait Assessment score to 29/30    Time 8    Period Weeks    Status New      PT LONG TERM GOAL #3   Title ABC Scale score improved to 62%    Time 8    Period Days    Status New      PT LONG TERM GOAL #4   Title BERG balance test score improved to 54/56    Time 8    Period Weeks    Status New      PT LONG TERM GOAL #5   Title Lower extremity strength including ankle musculature needed for balance improved to 4+/5 to 5/5.    Time 8    Period Weeks    Status New                   Plan - 12/04/20 1721     Clinical Impression Statement The patient is able to perform moderate balance challenges including walking on uneven terrain, maneuvering over obstacles, ex's to promote an ankle balance strategy and incorporating speed.  Used gait belt for safety but patient able to self stabilize without the need for therapist assist.  She relies on visual feedback as compensation for neuropathy.  Therapist monitoring response and providing CGA or supervision for safety.    Examination-Activity Limitations Locomotion Level;Stairs;Lift;Squat    Rehab Potential Good    PT Frequency 2x / week    PT  Duration 8 weeks    PT Treatment/Interventions ADLs/Self Care Home Management;Therapeutic activities;Therapeutic exercise;Balance training;Neuromuscular re-education;Manual techniques;Patient/family education    PT Next Visit Plan dynamic balance particularly walking outdoors uneven terrain; up and down steps  (especially down); squatting; eyes closed, backwards, wall pull aways for anterior tib activation; walking fast;  patient education on how to fall protection strategies to limit injury    PT Home Exercise Plan [phone]             Patient will benefit from skilled therapeutic intervention in order to improve the following deficits and impairments:  Difficulty walking, Decreased balance, Decreased strength  Visit Diagnosis: Repeated falls  Other lack of coordination  Muscle weakness (generalized)  Difficulty in walking, not elsewhere classified     Problem List Patient Active Problem List   Diagnosis Date Noted   Port-A-Cath in place 06/30/2019   Malignant neoplasm of lower-inner quadrant of left breast in female, estrogen receptor positive ([place]) 06/06/2019   SVT (supraventricular tachycardia) ([place]) 10/28/2012   Other specified cardiac dysrhythmias(427.89) 10/26/2012   Anxiety    Allergic rhinitis    Insomnia    Hot flashes    GERD (gastroesophageal reflux disease)    Depression    IBS (irritable bowel syndrome)    Ruben Im, PT 12/04/20 5:26 PM Phone: [phone] Fax: [phone]  Alvera Singh, PT 12/04/2020, 5:26 PM  [facility] @ [address] [place], [place], [address] Phone: [phone]   Fax:  [phone]  Name: Dajahnae Vondra MRN: [id number] Date of Birth: [birth year]

## 2020-12-08 ENCOUNTER — Other Ambulatory Visit (HOSPITAL_COMMUNITY): Payer: Self-pay

## 2020-12-13 ENCOUNTER — Ambulatory Visit: Payer: 59 | Attending: Oncology | Admitting: Physical Therapy

## 2020-12-13 ENCOUNTER — Other Ambulatory Visit: Payer: Self-pay

## 2020-12-13 DIAGNOSIS — M6281 Muscle weakness (generalized): Secondary | ICD-10-CM

## 2020-12-13 DIAGNOSIS — R278 Other lack of coordination: Secondary | ICD-10-CM | POA: Diagnosis present

## 2020-12-13 DIAGNOSIS — R296 Repeated falls: Secondary | ICD-10-CM | POA: Diagnosis present

## 2020-12-13 NOTE — Therapy (Addendum)
[facility] @ [place], [place], [address] Phone: [phone]   Fax:  [phone]  Physical Therapy Treatment/Discharge Summary   Patient Details  Name: Kathy Barker MRN: [id number] Date of Birth: May 16, 1960 Referring Provider (PT): Dr. Jana Hakim   Encounter Date: 12/13/2020   PT End of Session - 12/13/20 1720     Visit Number 3    Date for PT Re-Evaluation 01/18/21    Authorization Type [employer]    PT Start Time 1525    PT Stop Time 1610    PT Time Calculation (min) 45 min    Activity Tolerance Patient tolerated treatment well             Past Medical History:  Diagnosis Date   Allergic rhinitis    Anxiety    "only during my divorce" (11/18/2012)   Cancer ([place])    Depression    "only during my divorce" (11/18/2012)   GERD (gastroesophageal reflux disease)    High cholesterol    Hot flashes    IBS (irritable bowel syndrome)    Insomnia    Migraines    "lately more often; before SVT I'd get them ~ q 6 months" (11/18/2012)   PONV (postoperative nausea and vomiting)    SVT (supraventricular tachycardia) ([place])    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 by Dr Rayann Heman    Past Surgical History:  Procedure Laterality Date   BREAST LUMPECTOMY WITH RADIOACTIVE SEED AND SENTINEL LYMPH NODE BIOPSY Left 12/14/2019   Procedure: LEFT BREAST LUMPECTOMY X 2 WITH RADIOACTIVE SEED AND SENTINEL LYMPH NODE BIOPSY;  Surgeon: [name], MD;  Location: [facility];  Service: General;  Laterality: Left;  PEC BLOCK   CESAREAN SECTION  01/13/1990   COLOSTOMY     COMBINED HYSTERECTOMY VAGINAL / OOPHORECTOMY / A&P REPAIR  01/13/1997   IR IMAGING GUIDED PORT INSERTION  06/30/2019   IR REMOVAL TUN ACCESS W/ PORT W/O FL MOD SED  05/24/2020   SUPRAVENTRICULAR TACHYCARDIA ABLATION  11/18/2012   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   SUPRAVENTRICULAR TACHYCARDIA ABLATION N/A 11/18/2012    Procedure: SUPRAVENTRICULAR TACHYCARDIA ABLATION;  Surgeon: Coralyn Mark, MD;  Location: [facility] CATH LAB;  Service: Cardiovascular;  Laterality: N/A;   UPPER GASTROINTESTINAL ENDOSCOPY     VAGINAL HYSTERECTOMY  ~ 2000    There were no vitals filed for this visit.   Subjective Assessment - 12/13/20 1528     Subjective I tweaked my back catching myself when I lost my balance coming down the stairs.  I have had some back issues off and on since I was in my 80s.    Pertinent History history of breast cancer    Limitations House hold activities;Walking;Standing    Patient Stated Goals Be able to feel more comfortable so I can go back to my job and not feel like I'm going fall;  more stability    Currently in Pain? Yes    Pain Score 3     Pain Location Back                               [facility] Adult PT Treatment/Exercise - 12/13/20 0001       Therapeutic Activites    Therapeutic Activities Other Therapeutic Activities    Other Therapeutic Activities up and down steps carrying object      Neuro Re-ed  Neuro Re-ed Details  walking on soft mat over hurdles, forward, backward and lateral steps; standing on soft surface with head turns, UE reaches, eyes closed;  walking with balloon taps, wall ball bounce      Knee/Hip Exercises: Aerobic   Recumbent Bike 5 min while discussing status      Knee/Hip Exercises: Standing   Heel Raises Limitations eccentric lifts and lowers with weight shifting    Lateral Step Up Right;Left;5 reps    Lateral Step Up Limitations 3 heel touches    Forward Step Up 5 reps;Hand Hold: 0;Step Height: 6"    Forward Step Up Limitations opp step touch    Step Down Right;Left;1 set;5 reps    Step Down Limitations 4 inch heel touch weaker on left    Gait Training walking while dribbling large ball    Other Standing Knee Exercises butt off wall drill 15x    Other Standing Knee Exercises sit to stand with eyes closed                        PT Short Term Goals - 11/23/20 1226       PT SHORT TERM GOAL #1   Title The patient will demonstrate compliance with basic balance and strengthening    Time 4    Period Weeks    Status New    Target Date 12/21/20      PT SHORT TERM GOAL #2   Title The patient will be able to descend steps reciprocally with 1 UE railing for support    Time 4    Period Weeks    Status New      PT SHORT TERM GOAL #3   Title ABC scale score improved to 57%    Time 4    Period Weeks    Status New               PT Long Term Goals - 11/23/20 1228       PT LONG TERM GOAL #1   Title The patient will be independent in a safe self progression of HEP    Time 8    Period Weeks    Status New    Target Date 01/18/21      PT LONG TERM GOAL #2   Title The patient will have improved Functional Gait Assessment score to 29/30    Time 8    Period Weeks    Status New      PT LONG TERM GOAL #3   Title ABC Scale score improved to 62%    Time 8    Period Days    Status New      PT LONG TERM GOAL #4   Title BERG balance test score improved to 54/56    Time 8    Period Weeks    Status New      PT LONG TERM GOAL #5   Title Lower extremity strength including ankle musculature needed for balance improved to 4+/5 to 5/5.    Time 8    Period Weeks    Status New                   Plan - 12/13/20 1720     Clinical Impression Statement The patient reports she has low back discomfort after she caught herself when descending stairs.  Treatment focus on balance and strength with ascending and descending stairs today.  Decreased stability noted with left  weight bearing/right step downs.  No loss of balance with eyes closed challenges or with walking/multi tasking.  Walking on soft surfaces more challenging but no physical assist needed. CGA with gait belt used for safety.    Examination-Activity Limitations Locomotion Level;Stairs;Lift;Squat    Examination-Participation  Restrictions Community Activity;Yard Work;Occupation;Shop    Stability/Clinical Decision Making Stable/Uncomplicated    Rehab Potential Good    PT Frequency 2x / week    PT Duration 8 weeks    PT Treatment/Interventions ADLs/Self Care Home Management;Therapeutic activities;Therapeutic exercise;Balance training;Neuromuscular re-education;Manual techniques;Patient/family education    PT Next Visit Plan up and down stairs carrying beach ball; squatting; right step downs for left leg strengthening;  glute med strengthening; try rocker board; check STGs next week    PT Home Exercise Plan [phone]             Patient will benefit from skilled therapeutic intervention in order to improve the following deficits and impairments:  Difficulty walking, Decreased balance, Decreased strength, Other (comment)  Visit Diagnosis: Repeated falls  Other lack of coordination  Muscle weakness (generalized)   PHYSICAL THERAPY DISCHARGE SUMMARY  Visits from Start of Care: 3  Current functional level related to goals / functional outcomes: The patient cancelled remaining appts secondary to insurance change.     Remaining deficits: As above   Education / Equipment: HEP   Patient agrees to discharge. Patient goals were not met. Patient is being discharged due to not returning since the last visit.   Problem List Patient Active Problem List   Diagnosis Date Noted   Port-A-Cath in place 06/30/2019   Malignant neoplasm of lower-inner quadrant of left breast in female, estrogen receptor positive ([place]) 06/06/2019   SVT (supraventricular tachycardia) ([place]) 10/28/2012   Other specified cardiac dysrhythmias(427.89) 10/26/2012   Anxiety    Allergic rhinitis    Insomnia    Hot flashes    GERD (gastroesophageal reflux disease)    Depression    IBS (irritable bowel syndrome)    Kathy Barker, PT 12/13/20 5:27 PM Phone: [phone] Fax: [phone]  Kathy Barker, PT 12/13/2020, 5:27  PM  [facility] @ [place], [place], [address] Phone: [phone]   Fax:  [phone]  Name: Kathy Barker MRN: [id number] Date of Birth: 1961/01/07

## 2020-12-16 ENCOUNTER — Other Ambulatory Visit (HOSPITAL_COMMUNITY): Payer: Self-pay

## 2020-12-17 ENCOUNTER — Encounter: Payer: Self-pay | Admitting: Oncology

## 2020-12-17 ENCOUNTER — Other Ambulatory Visit (HOSPITAL_COMMUNITY): Payer: Self-pay

## 2020-12-17 MED ORDER — TEMAZEPAM 15 MG PO CAPS
ORAL_CAPSULE | ORAL | 0 refills | Status: DC
  Filled 2020-12-17: qty 60, 30d supply, fill #0

## 2020-12-18 ENCOUNTER — Encounter: Payer: 59 | Admitting: Physical Therapy

## 2020-12-18 ENCOUNTER — Other Ambulatory Visit (HOSPITAL_COMMUNITY): Payer: Self-pay

## 2020-12-20 ENCOUNTER — Encounter: Payer: 59 | Admitting: Physical Therapy

## 2020-12-24 ENCOUNTER — Other Ambulatory Visit (HOSPITAL_COMMUNITY): Payer: Self-pay

## 2020-12-24 MED ORDER — ACYCLOVIR 400 MG PO TABS
ORAL_TABLET | ORAL | 1 refills | Status: DC
Start: 2020-02-28 — End: 2021-07-01
  Filled 2020-12-24: qty 120, 40d supply, fill #0
  Filled 2021-01-23: qty 60, 30d supply, fill #1

## 2020-12-25 ENCOUNTER — Encounter: Payer: 59 | Admitting: Physical Therapy

## 2020-12-27 ENCOUNTER — Encounter: Payer: 59 | Admitting: Physical Therapy

## 2021-01-10 ENCOUNTER — Encounter: Payer: Self-pay | Admitting: Oncology

## 2021-01-15 ENCOUNTER — Encounter: Payer: 59 | Admitting: Physical Therapy

## 2021-01-16 ENCOUNTER — Telehealth: Payer: Self-pay | Admitting: *Deleted

## 2021-01-16 NOTE — Telephone Encounter (Signed)
[facility] faxed URGENT, [place] page request for protected health information for May 16, 2020 to present.  Included previous requests dated 11/21/2020, 01/08/2021 and 01/16/2021; marred "Release of Information" patient signed 07/27/2020, with 12/13/2020 letter from [facility] confirming receipt of request yet unfortunately unable to comply.  Request must be addressed to facility not provider. Incomplete authorization with mark outs.  Message left repeating information per the 12/13/2020 [facility] letter from [place] [employer] Department for [facility] [phone] ext. [phone]).  Provided direct extensions for this nurse if further questions or needs or connect with (SW) [facility] [phone] to follow prompts as directed.

## 2021-01-17 ENCOUNTER — Encounter: Payer: 59 | Admitting: Physical Therapy

## 2021-01-22 ENCOUNTER — Encounter: Payer: 59 | Admitting: Physical Therapy

## 2021-01-23 ENCOUNTER — Encounter: Payer: Self-pay | Admitting: Oncology

## 2021-01-23 ENCOUNTER — Other Ambulatory Visit (HOSPITAL_COMMUNITY): Payer: Self-pay

## 2021-01-24 ENCOUNTER — Other Ambulatory Visit (HOSPITAL_COMMUNITY): Payer: Self-pay

## 2021-01-24 ENCOUNTER — Encounter: Payer: 59 | Admitting: Physical Therapy

## 2021-01-25 ENCOUNTER — Other Ambulatory Visit (HOSPITAL_COMMUNITY): Payer: Self-pay

## 2021-01-28 ENCOUNTER — Other Ambulatory Visit (HOSPITAL_COMMUNITY): Payer: Self-pay

## 2021-01-28 ENCOUNTER — Encounter: Payer: Self-pay | Admitting: Adult Health

## 2021-01-30 ENCOUNTER — Other Ambulatory Visit (HOSPITAL_COMMUNITY): Payer: Self-pay

## 2021-01-31 ENCOUNTER — Telehealth: Payer: Self-pay

## 2021-01-31 NOTE — Telephone Encounter (Signed)
I have started a prior authorization thru [place] for patients omeprazole 40mg  one BID for her GERD K21.9. Will await outcome.

## 2021-02-05 ENCOUNTER — Telehealth: Payer: Self-pay

## 2021-02-05 NOTE — Telephone Encounter (Signed)
Called and left VM for pt, bone density shows mild osteopenia.  Recommended starting supplements of calcium and vit D and weight bearing exercises.  Requested pt please call back with any questions or concerns.

## 2021-02-06 ENCOUNTER — Encounter: Payer: Self-pay | Admitting: Oncology

## 2021-02-06 ENCOUNTER — Other Ambulatory Visit (HOSPITAL_COMMUNITY): Payer: Self-pay

## 2021-02-06 NOTE — Telephone Encounter (Signed)
I called and held for 45 minutes with [employer] [phone] to see if her omeprazole was approved. Per [employer] I had to do that.No one ever came to the phone. I called [facility] and she can get it for $11.49 without insurance. They mail it to her . I have called her and left her a detailed message with this information and told her if she prefers to pick it up call the pharmacy # [phone] otherwise they are going to mail it to her.

## 2021-02-07 ENCOUNTER — Other Ambulatory Visit (HOSPITAL_COMMUNITY): Payer: Self-pay

## 2021-02-07 ENCOUNTER — Encounter: Payer: Self-pay | Admitting: Oncology

## 2021-02-11 ENCOUNTER — Other Ambulatory Visit: Payer: Self-pay | Admitting: *Deleted

## 2021-02-11 ENCOUNTER — Other Ambulatory Visit (HOSPITAL_COMMUNITY): Payer: Self-pay

## 2021-02-11 ENCOUNTER — Encounter: Payer: Self-pay | Admitting: Internal Medicine

## 2021-02-11 ENCOUNTER — Other Ambulatory Visit: Payer: Self-pay | Admitting: Internal Medicine

## 2021-02-11 ENCOUNTER — Encounter: Payer: Self-pay | Admitting: Oncology

## 2021-02-11 ENCOUNTER — Ambulatory Visit: Payer: Managed Care, Other (non HMO) | Admitting: Internal Medicine

## 2021-02-11 VITALS — BP 140/70 | HR 89 | Ht 62.0 in | Wt 138.0 lb

## 2021-02-11 DIAGNOSIS — K449 Diaphragmatic hernia without obstruction or gangrene: Secondary | ICD-10-CM

## 2021-02-11 DIAGNOSIS — K219 Gastro-esophageal reflux disease without esophagitis: Secondary | ICD-10-CM | POA: Diagnosis not present

## 2021-02-11 DIAGNOSIS — R1112 Projectile vomiting: Secondary | ICD-10-CM

## 2021-02-11 DIAGNOSIS — C50312 Malignant neoplasm of lower-inner quadrant of left female breast: Secondary | ICD-10-CM

## 2021-02-11 DIAGNOSIS — Z170 Estrogen receptor positive status [ER+]: Secondary | ICD-10-CM

## 2021-02-11 MED ORDER — OMEPRAZOLE 40 MG PO CPDR
DELAYED_RELEASE_CAPSULE | ORAL | 2 refills | Status: DC
  Filled 2021-02-11 (×2): qty 60, 30d supply, fill #0

## 2021-02-11 NOTE — Patient Instructions (Signed)
Take the reglan you have at home.  Take your omeprazole 30 minutes before breakfast and supper daily.  You have been scheduled for an Upper GI Series at ____________. Your appointment is on ____________ at ______________. Please arrive 15 minutes prior to your test for registration. Make sure not to eat or drink anything after midnight on the night before your test. If you need to reschedule, please call radiology at [phone]. ________________________________________________________________ An upper GI series uses x rays to help diagnose problems of the upper GI tract, which includes the esophagus, stomach, and duodenum. The duodenum is the first part of the small intestine. An upper GI series is conducted by a radiology technologist or a radiologist--a doctor who specializes in x-ray imaging--at a hospital or outpatient center. While sitting or standing in front of an x-ray machine, the patient drinks barium liquid, which is often white and has a chalky consistency and taste. The barium liquid coats the lining of the upper GI tract and makes signs of disease show up more clearly on x rays. X-ray video, called fluoroscopy, is used to view the barium liquid moving through the esophagus, stomach, and duodenum. Additional x rays and fluoroscopy are performed while the patient lies on an x-ray table. To fully coat the upper GI tract with barium liquid, the technologist or radiologist may press on the abdomen or ask the patient to change position. Patients hold still in various positions, allowing the technologist or radiologist to take x rays of the upper GI tract at different angles. If a technologist conducts the upper GI series, a radiologist will later examine the images to look for problems.  This test typically takes about 1 hour to complete. __________________________________________________________________  I appreciate the opportunity to care for you. Silvano Rusk, MD, [facility]

## 2021-02-11 NOTE — Progress Notes (Addendum)
Kathy Barker 61 y.o. 1960-02-19 [id number]  Assessment & Plan:   Encounter Diagnoses  Name Primary?   Gastroesophageal reflux disease, unspecified whether esophagitis present Yes   Hiatal hernia    Projectile vomiting with nausea     Cause of her vomiting problems is not clear.  EGD recently with 5 cm hiatal hernia and dilation to 20 mm so I do not think this is dysphagia.  Question some sort of dysmotility question if there is some relationship to her hiatal hernia perhaps she could have gastric emptying problems.  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  That still a possibility.  I have asked her to modify her medication regimen such that she is taking her pantoprazole before breakfast still but before supper instead of bedtime and to take 10 mg of Reglan at bedtime as she still has some of that medication which had previously been prescribed by Dr. Jana Hakim.  Once I see the results of the upper GI series I will contact her.  Could need manometry studies of the esophagus could need gastric emptying study (off Reglan).  If the Reglan solves the problem would probably discontinue that as the utility of gastric emptying study not entirely clear to me at this point.   CC: [name], MD  Addendum  Colonoscopy last done February 2013 it was negative performed by digestive health specialist.  That means she is due for a 10-year colonoscopy.  We will arrange pending review of the upper GI series. Subjective:   Chief Complaint: Vomiting, reflux  HPI 61 year old white woman seen in August for office visit and EGD, she was having a lot of heartburn and reflux problems in the setting of prior treatment for breast cancer with chemotherapy and radiation.  She was known to have some peripheral neuropathy.  She had been on Reglan 10 mg 3 times daily and was complaining of dysphagia as well.    09/05/2020 EGD  results:  - Benign-appearing esophageal stenosis. Dilated. Biopsied. - 5 cm hiatal hernia. - Gastroesophageal flap valve classified as Hill Grade IV (no fold, wide open lumen, hiatal hernia present). - Duodenitis. Biopsied. - The examination was otherwise normal. Vomited small amount of bloody fluid on way to recovery - awakened and no issues after that Pathology results Diagnosis 1. Surgical [P], duodenum - MILDLY ACTIVE NONSPECIFIC DUODENITIS. SEE NOTE - NEGATIVE FOR INCREASED INTRAEPITHELIAL LYMPHOCYTES OR VILLOUS ARCHITECTURAL CHANGES 2. Surgical [P], esophagus and esophageal stricture - ESOPHAGEAL SQUAMOUS MUCOSA WITH NO SPECIFIC HISTOPATHOLOGIC CHANGES - NEGATIVE FOR INCREASED INTRAEPITHELIAL EOSINOPHILS  So I had her take twice daily PPI and stop the Reglan and overall she is significantly better with respect to heartburn and odynophagia and she is not having dysphagia.  However a couple of times a week when she lies down at night she will be able to drift off to sleep or just barely so and then she will have some projectile vomiting of clear fluid.  No clear trigger she tries to keep 3 hours or more between lying down and going to bed and she does have head of bed elevated.  She is taking her second PPI dose at bedtime not before supper.   Wt Readings from Last 3 Encounters:  02/11/21 138 lb (62.6 kg)  10/25/20 135 lb 14.4 oz (61.6 kg)  09/05/20 139 lb (63 kg)     Allergies  Allergen Reactions   Fentanyl Nausea And Vomiting  Per patient " extreme nausea and vomiting "    Other Other (See Comments)    Problems with stitches   Simvastatin Other (See Comments)   Venlafaxine Other (See Comments)   Versed [Midazolam] Nausea And Vomiting    Per patient " extreme N/V"    Latex Rash   Current Meds  Medication Sig   acyclovir (ZOVIRAX) 400 MG tablet Take 2 tablets by mouth 3 times a day for 10 days then decrease to 1 tablet 2 times a day   gabapentin (NEURONTIN) 300 MG  capsule Take 2 capsules (600 mg total) by mouth at bedtime.   omeprazole (PRILOSEC) 40 MG capsule Take 1 capsule (40 mg total) by mouth in the morning and at bedtime.   sertraline (ZOLOFT) 100 MG tablet Take 100 mg by mouth daily.   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 by Dr Rayann Heman   Past Surgical History:  Procedure Laterality Date   BREAST LUMPECTOMY WITH RADIOACTIVE SEED AND SENTINEL LYMPH NODE BIOPSY Left 12/14/2019   Procedure: LEFT BREAST LUMPECTOMY X 2 WITH RADIOACTIVE SEED AND SENTINEL LYMPH NODE BIOPSY;  Surgeon: [name], MD;  Location: [facility];  Service: General;  Laterality: Left;  PEC BLOCK   CESAREAN SECTION  01/13/1990   COLOSTOMY     COMBINED HYSTERECTOMY VAGINAL / OOPHORECTOMY / A&P REPAIR  01/13/1997   IR IMAGING GUIDED PORT INSERTION  06/30/2019   IR REMOVAL TUN ACCESS W/ PORT W/O FL MOD SED  05/24/2020   SUPRAVENTRICULAR TACHYCARDIA ABLATION  11/18/2012   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   SUPRAVENTRICULAR TACHYCARDIA ABLATION N/A 11/18/2012   Procedure: SUPRAVENTRICULAR TACHYCARDIA ABLATION;  Surgeon: Coralyn Mark, MD;  Location: [facility] CATH LAB;  Service: Cardiovascular;  Laterality: N/A;   UPPER GASTROINTESTINAL ENDOSCOPY     VAGINAL HYSTERECTOMY  ~ 2000   Social History   Social History Narrative   She is divorced and has 2 adult sons.  1  son still lives with her.   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   family history includes Alcohol abuse in her father; Breast cancer in her mother; Heart Problems in an other family member; Heart attack in her father and paternal grandfather.   Review of Systems As per HPI  Objective:   Physical Exam BP 140/70    Pulse 89    Ht 5\' 2"  (1.575 m)    Wt 138 lb (62.6 kg)    BMI 25.24 kg/m  Well-developed well-nourished no acute distress

## 2021-02-12 ENCOUNTER — Telehealth: Payer: Self-pay | Admitting: Hematology and Oncology

## 2021-02-12 ENCOUNTER — Inpatient Hospital Stay: Payer: Managed Care, Other (non HMO) | Admitting: Adult Health

## 2021-02-12 ENCOUNTER — Inpatient Hospital Stay: Payer: Managed Care, Other (non HMO)

## 2021-02-12 ENCOUNTER — Telehealth: Payer: Self-pay

## 2021-02-12 NOTE — Telephone Encounter (Signed)
Received a fax today that the omeprazole 40mg  capsules have been approved from 01/31/21-01/31/22.

## 2021-02-12 NOTE — Telephone Encounter (Signed)
Scheduled patient to see NP per providers schedule. Left message with details of the change, left my direct number if patient did not want this appointment.

## 2021-02-20 ENCOUNTER — Ambulatory Visit (HOSPITAL_COMMUNITY): Payer: Managed Care, Other (non HMO)

## 2021-02-20 ENCOUNTER — Other Ambulatory Visit (HOSPITAL_COMMUNITY): Payer: Self-pay

## 2021-02-21 ENCOUNTER — Inpatient Hospital Stay: Payer: Commercial Managed Care - HMO | Attending: Hematology and Oncology

## 2021-02-21 ENCOUNTER — Other Ambulatory Visit: Payer: Self-pay

## 2021-02-21 ENCOUNTER — Inpatient Hospital Stay (HOSPITAL_BASED_OUTPATIENT_CLINIC_OR_DEPARTMENT_OTHER): Payer: Commercial Managed Care - HMO | Admitting: Hematology and Oncology

## 2021-02-21 ENCOUNTER — Encounter: Payer: Self-pay | Admitting: Hematology and Oncology

## 2021-02-21 ENCOUNTER — Ambulatory Visit (HOSPITAL_COMMUNITY)
Admission: RE | Admit: 2021-02-21 | Discharge: 2021-02-21 | Disposition: A | Discharge status: Home / Self Care | Payer: Managed Care, Other (non HMO) | Source: Ambulatory Visit | Attending: Hematology and Oncology | Admitting: Hematology and Oncology

## 2021-02-21 ENCOUNTER — Other Ambulatory Visit: Payer: Self-pay | Admitting: Internal Medicine

## 2021-02-21 ENCOUNTER — Other Ambulatory Visit (HOSPITAL_COMMUNITY): Payer: Self-pay

## 2021-02-21 VITALS — BP 136/77 | HR 108 | Temp 97.7°F | Resp 18 | Ht 62.0 in

## 2021-02-21 DIAGNOSIS — C50312 Malignant neoplasm of lower-inner quadrant of left female breast: Secondary | ICD-10-CM | POA: Diagnosis not present

## 2021-02-21 DIAGNOSIS — Z8249 Family history of ischemic heart disease and other diseases of the circulatory system: Secondary | ICD-10-CM | POA: Insufficient documentation

## 2021-02-21 DIAGNOSIS — Z803 Family history of malignant neoplasm of breast: Secondary | ICD-10-CM | POA: Insufficient documentation

## 2021-02-21 DIAGNOSIS — M25512 Pain in left shoulder: Secondary | ICD-10-CM | POA: Diagnosis not present

## 2021-02-21 DIAGNOSIS — Z170 Estrogen receptor positive status [ER+]: Secondary | ICD-10-CM | POA: Diagnosis not present

## 2021-02-21 DIAGNOSIS — Z79899 Other long term (current) drug therapy: Secondary | ICD-10-CM | POA: Insufficient documentation

## 2021-02-21 DIAGNOSIS — Z885 Allergy status to narcotic agent status: Secondary | ICD-10-CM | POA: Insufficient documentation

## 2021-02-21 DIAGNOSIS — Z90721 Acquired absence of ovaries, unilateral: Secondary | ICD-10-CM | POA: Insufficient documentation

## 2021-02-21 DIAGNOSIS — K219 Gastro-esophageal reflux disease without esophagitis: Secondary | ICD-10-CM | POA: Insufficient documentation

## 2021-02-21 DIAGNOSIS — G629 Polyneuropathy, unspecified: Secondary | ICD-10-CM | POA: Diagnosis not present

## 2021-02-21 DIAGNOSIS — Z811 Family history of alcohol abuse and dependence: Secondary | ICD-10-CM | POA: Diagnosis not present

## 2021-02-21 DIAGNOSIS — R1112 Projectile vomiting: Secondary | ICD-10-CM | POA: Insufficient documentation

## 2021-02-21 DIAGNOSIS — Z560 Unemployment, unspecified: Secondary | ICD-10-CM | POA: Diagnosis not present

## 2021-02-21 DIAGNOSIS — M8580 Other specified disorders of bone density and structure, unspecified site: Secondary | ICD-10-CM | POA: Insufficient documentation

## 2021-02-21 LAB — CBC WITH DIFFERENTIAL (CANCER CENTER ONLY)
Abs Immature Granulocytes: 0 10*3/uL (ref 0.00–0.07)
Basophils Absolute: 0 10*3/uL (ref 0.0–0.1)
Basophils Relative: 1 %
Eosinophils Absolute: 0.1 10*3/uL (ref 0.0–0.5)
Eosinophils Relative: 1 %
HCT: 41.3 % (ref 36.0–46.0)
Hemoglobin: 13.8 g/dL (ref 12.0–15.0)
Immature Granulocytes: 0 %
Lymphocytes Relative: 28 %
Lymphs Abs: 1.8 10*3/uL (ref 0.7–4.0)
MCH: 30.1 pg (ref 26.0–34.0)
MCHC: 33.4 g/dL (ref 30.0–36.0)
MCV: 90.2 fL (ref 80.0–100.0)
Monocytes Absolute: 0.4 10*3/uL (ref 0.1–1.0)
Monocytes Relative: 7 %
Neutro Abs: 4 10*3/uL (ref 1.7–7.7)
Neutrophils Relative %: 63 %
Platelet Count: 219 10*3/uL (ref 150–400)
RBC: 4.58 MIL/uL (ref 3.87–5.11)
RDW: 13.2 % (ref 11.5–15.5)
WBC Count: 6.3 10*3/uL (ref 4.0–10.5)
nRBC: 0 % (ref 0.0–0.2)

## 2021-02-21 LAB — CMP (CANCER CENTER ONLY)
ALT: 39 U/L (ref 0–44)
AST: 28 U/L (ref 15–41)
Albumin: 4.6 g/dL (ref 3.5–5.0)
Alkaline Phosphatase: 127 U/L — ABNORMAL HIGH (ref 38–126)
Anion gap: 10 (ref 5–15)
BUN: 12 mg/dL (ref 8–23)
CO2: 25 mmol/L (ref 22–32)
Calcium: 9.7 mg/dL (ref 8.9–10.3)
Chloride: 104 mmol/L (ref 98–111)
Creatinine: 1 mg/dL (ref 0.44–1.00)
GFR, Estimated: >60 mL/min (ref >60)
Glucose, Bld: 140 mg/dL — ABNORMAL HIGH (ref 70–99)
Potassium: 4.1 mmol/L (ref 3.5–5.1)
Sodium: 139 mmol/L (ref 135–145)
Total Bilirubin: 0.4 mg/dL (ref 0.3–1.2)
Total Protein: 7.1 g/dL (ref 6.5–8.1)

## 2021-02-21 MED ORDER — OMEPRAZOLE 40 MG PO CPDR
DELAYED_RELEASE_CAPSULE | ORAL | 2 refills | Status: DC
  Filled 2021-02-21: qty 60, 30d supply, fill #0
  Filled 2021-03-21: qty 60, 30d supply, fill #1
  Filled 2021-04-22: qty 60, 30d supply, fill #2

## 2021-02-21 NOTE — Progress Notes (Signed)
°[facility]  °[phone] [phone]  ° ° °ID: Kathy Barker DOB: 09/02/1960  MR#: [id number]  CSN#:[id number] ° °Patient Care Team: °Shaw, Kimberlee, MD as PCP - General (Family Medicine) °Toth, Paul III, MD as Consulting Physician (General Surgery) °[name], Gustav C, MD (Inactive) as Consulting Physician (Oncology) °Squire, Sarah, MD as Attending Physician (Radiation Oncology) °Tafeen, Stuart, MD as Consulting Physician (Dermatology) °Praveena Iruku, MD °OTHER MD: ° ° °CHIEF COMPLAINT: Left-sided breast cancer ° °CURRENT TREATMENT: observation ° ° °INTERVAL HISTORY: °Danny returns today for follow up of her left-sided breast cancer. She is now under observation. °She is dealing with neuropathy, physical therapy has been helping some, she has been taking gabapentin 600 mg at night.. °It helps some, but continues to stumble and is worried about falls. °She is willing to try and go back to work, wants to go back for about 15-20 hrs of work. °She reports pain in the left wing bone which occasionally radiates to the shoulder and arm pit. She thinks its more consistent, doesn't go away. °Intermittent projectile vomiting, she is working with Dr Gessner for this.  °Bone density according to patient showed osteopenia, she did her mammogram at [facility] was normal according to verbal report. ° ° °REVIEW OF SYSTEMS: ° ° COVID 19 VACCINATION STATUS: She has had both immunizations, most recently April 2021 ° ° °HISTORY OF CURRENT ILLNESS: °From the original intake note: ° °[name] herself noted a palpable lower-inner left breast lump and immediately brought it to medical attention.. She underwent bilateral diagnostic mammography with tomography and left breast ultrasonography at [facility] on 05/30/2019 showing: breast density category B; 1.7 cm lobulated mass in left breast at 8 o'clock; no significant left axillary abnormalities. ° °Accordingly on 06/01/2019 she proceeded to biopsy of the left  breast area in question. The pathology from this procedure ([id number]) showed: invasive ductal carcinoma, grade 3. Prognostic indicators significant for: estrogen receptor, 70% positive with weak staining intensity and progesterone receptor, 0% negative. Proliferation marker Ki67 at 80%. HER2 negative by immunohistochemistry (1+). ° °The patient's subsequent history is as detailed below. 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 by Dr Allred  ° ° °PAST SURGICAL HISTORY: °Past Surgical History:  °Procedure Laterality Date  ° BREAST LUMPECTOMY WITH RADIOACTIVE SEED AND SENTINEL LYMPH NODE BIOPSY Left 12/14/2019  ° Procedure: LEFT BREAST LUMPECTOMY X 2 WITH RADIOACTIVE SEED AND SENTINEL LYMPH NODE BIOPSY;  Surgeon: Toth, Paul III, MD;  Location:  SURGERY CENTER;  Service: General;  Laterality: Left;  PEC BLOCK  ° CESAREAN SECTION  01/13/1990  ° COLOSTOMY    ° COMBINED HYSTERECTOMY VAGINAL / OOPHORECTOMY / A&P REPAIR  01/13/1997  ° IR IMAGING GUIDED PORT INSERTION  06/30/2019  ° IR REMOVAL TUN ACCESS W/ PORT W/O FL MOD SED  05/24/2020  ° SUPRAVENTRICULAR TACHYCARDIA ABLATION  11/18/2012  °   slow pathway modification of AVNRT by Dr Allred  ° SUPRAVENTRICULAR TACHYCARDIA ABLATION N/A 11/18/2012  ° Procedure: SUPRAVENTRICULAR TACHYCARDIA ABLATION;  Surgeon: James D Allred, MD;  Location: [facility]ATH [facility];  Service: Cardiovascular;  Laterality: N/A;  ° UPPER GASTROINTESTINAL ENDOSCOPY    °  VAGINAL HYSTERECTOMY  ~ 2000  ° ° °FAMILY HISTORY: °Family History  °Problem Relation Age of Onset  ° Breast cancer Mother   ° Heart attack Father   ° Alcohol abuse Father   ° Heart attack Paternal Grandfather   ° Heart Problems Other   °     both sides of family  ° Rectal cancer Neg Hx   ° Stomach cancer Neg Hx   ° Colon cancer Neg Hx   ° Her father died at age 53 from alcohol abuse. Her mother is living at age 62 (as of 05/2019) and has a history of DCIS at age 65 and invasive lobular breast cancer at age 62. [name] has one 62 brother. She reports cancer of an unknown type in her maternal grandmother and prostate cancer in a maternal uncle. ° ° °GYNECOLOGIC HISTORY:  °No LMP recorded. Patient has had a hysterectomy. °Menarche: 61 years old °Age at first live birth: 61 years old °GX P 2 °LMP 2000 °Contraceptive: never used °HRT never used  °Hysterectomy? yes °BSO? 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  She lost her job during her chemo treatments.  She is divorced. She lives at home with son Riley, age 24, who works in landscaping in [place] and is working towards a business degree. Son Zack, age 28, works in security installation here in [place] and just recently moved to [place] to start a cryptocurrency business.  The patient is not a church-attender.  °  ° ADVANCED DIRECTIVES: Not in place. She intends to name both of her sons as her HCPOA. ° ° °HEALTH MAINTENANCE: °Social History  ° °Tobacco Use  ° Smoking status: Never  ° Smokeless tobacco: Never  °Vaping Use  ° Vaping Use: Never used  °Substance Use Topics  ° Alcohol use: Yes  °  Comment: 11/18/2012 "shot of brandy couple times/month"  ° Drug use: No  ° ° ° GI: Upper endoscopy 09/05/2020 ° PAP: date unsure ° Bone density: never done °  °Allergies  °Allergen Reactions  ° Fentanyl Nausea And Vomiting  °  Per patient " extreme nausea and vomiting "   ° Other  Other (See Comments)  °  Problems with stitches  ° Simvastatin Other (See Comments)  ° Venlafaxine Other (See Comments)  ° Versed [Midazolam] Nausea And Vomiting  °  Per patient " extreme N/V"   ° Latex Rash  ° ° °Current Outpatient Medications  °Medication Sig Dispense Refill  ° acyclovir (ZOVIRAX) 400 MG tablet Take 2 tablets by mouth 3 times a day for 10 days then decrease to 1 tablet 2 times a day 120 tablet 1  ° gabapentin (NEURONTIN) 300 MG capsule Take 2 capsules (600 mg total) by mouth at bedtime. 120 capsule 6  ° metoCLOPramide (REGLAN) 10 MG tablet Take 1 tablet (10 mg total) by mouth at bedtime.    ° omeprazole (PRILOSEC) 40 MG capsule Take 40 mg by mouth. Take 30 minutes prior to breakfast and supper daily    ° omeprazole (PRILOSEC) 40 MG capsule Take 30 minutes prior to breakfast and supper daily 60 capsule 2  ° sertraline (  ZOLOFT) 100 MG tablet Take 100 mg by mouth daily.     temazepam (RESTORIL) 15 MG capsule Take 1 - 2 capsules by mouth at bedtime as needed 60 capsule 0   No current facility-administered medications for this visit.    OBJECTIVE: White woman in no acute distress Vitals:   02/21/21 1257  BP: 136/77  Pulse: (!) 108  Resp: 18  Temp: 97.7 F (36.5 C)  SpO2: 97%     Body mass index is 25.24 kg/m.   Wt Readings from Last 3 Encounters:  02/11/21 138 lb (62.6 kg)  10/25/20 135 lb 14.4 oz (61.6 kg)  09/05/20 139 lb (63 kg)     ECOG FS:1 - Symptomatic but completely ambulatory  Sclerae unicteric, EOMs intact Wearing a mask No cervical or supraclavicular adenopathy Lungs no rales or rhonchi Heart regular rate and rhythm Abd soft, nontender, positive bowel sounds MSK no focal spinal tenderness, no upper extremity lymphedema Neuro: nonfocal, well oriented, appropriate affect Breasts: The right breast is benign per the left breast is status postlumpectomy and radiation.  There is no evidence of local recurrence.  Both axillae are benign.   LAB RESULTS:  CMP      Component Value Date/Time   NA 139 02/21/2021 1213   K 4.1 02/21/2021 1213   CL 104 02/21/2021 1213   CO2 25 02/21/2021 1213   GLUCOSE 140 (H) 02/21/2021 1213   BUN 12 02/21/2021 1213   CREATININE 1.00 02/21/2021 1213   CALCIUM 9.7 02/21/2021 1213   PROT 7.1 02/21/2021 1213   ALBUMIN 4.6 02/21/2021 1213   AST 28 02/21/2021 1213   ALT 39 02/21/2021 1213   ALKPHOS 127 (H) 02/21/2021 1213   BILITOT 0.4 02/21/2021 1213   GFRNONAA >60 02/21/2021 1213   GFRAA >60 10/04/2019 1241    No results found for: TOTALPROTELP, ALBUMINELP, A1GS, A2GS, BETS, BETA2SER, GAMS, MSPIKE, SPEI  Lab Results  Component Value Date   WBC 6.3 02/21/2021   NEUTROABS 4.0 02/21/2021   HGB 13.8 02/21/2021   HCT 41.3 02/21/2021   MCV 90.2 02/21/2021   PLT 219 02/21/2021    No results found for: LABCA2  No components found for: [id number]  No results for input(s): INR in the last 168 hours.  No results found for: LABCA2  No results found for: UYQ034  No results found for: VQQ595  No results found for: GLO756  No results found for: CA2729  No components found for: HGQUANT  No results found for: CEA1 / No results found for: CEA1   No results found for: AFPTUMOR  No results found for: CHROMOGRNA  No results found for: KPAFRELGTCHN, LAMBDASER, KAPLAMBRATIO (kappa/lambda light chains)  No results found for: HGBA, HGBA2QUANT, HGBFQUANT, HGBSQUAN (Hemoglobinopathy evaluation)   No results found for: LDH  No results found for: IRON, TIBC, IRONPCTSAT (Iron and TIBC)  No results found for: FERRITIN  Urinalysis No results found for: COLORURINE, APPEARANCEUR, LABSPEC, PHURINE, GLUCOSEU, HGBUR, BILIRUBINUR, KETONESUR, PROTEINUR, UROBILINOGEN, NITRITE, LEUKOCYTESUR   STUDIES: No results found.    ELIGIBLE FOR AVAILABLE RESEARCH PROTOCOL: no  ASSESSMENT: 61 y.o. [place] woman status post left breast lower inner quadrant biopsy 06/01/2019 for a clinical T1c N0, stage IB invasive ductal  carcinoma, grade 3, weakly estrogen receptor positive but functionally triple negative, with an MIB-1 of 80%.  (1) Oncotype score of 51 predicts a risk of recurrence outside the breast in the next 9 years of greater than 39% if the patient's only systemic therapy is antiestrogens for  5 years.  It also predicts a greater than 15% benefit from chemotherapy ° °(2) neoadjuvant chemotherapy consisting of doxorubicin and cyclophosphamide in dose dense fashion x4 started 06/30/2019, completed 08/23/2019, followed by weekly paclitaxel and carboplatin weekly x12 starting 09/13/2019, discontinued after 10/04/2019 dose ° (a) carboplatin/paclitaxel discontinued after 3 doses with neuropathy developing ° (b) repeat breast MRI 10/24/2019 shows resolution of the known malignancy ° °(3) Left lumpectomy on 12/14/2019 shows residual 0.3 cm invasive ductal carcinoma, margins negative, 3 sentinel lymph nodes negative ° (a) repeat prognostic panel triple negative with an Mib-1 of 65% ° °(4) adjuvant radiation 01/24/2020 through 02/24/2020 °Site Technique Total Dose (Gy) Dose per Fx (Gy) Completed Fx Beam Energies  °Breast, Left: Breast_Lt 3D 40.05/40.05 2.67 15/15 6X, 10X  °Breast, Left: Breast_Lt_Bst 3D 10/10 2 5/5 6X, 10X  ° ° °PLAN: °Erikah is coming up in a year from definitive surgery for her breast cancer with no evidence of disease recurrence.  This is favorable. °She continues to deal with neuropathy, takes gabapentin 600 mg QHS with some relief. °She would like to go back to work but hopefully part-time for a while until she gets used to everything. °She does complain of a left scapular pain which has been ongoing for the past several months, this has been more constant and no relief, dull deep bony ache.  No other bone pains.  We will try to get an x-ray of the left scapula.  She was instructed to call us if the pain persists in the next 4 weeks and we can always order a bone scan to evaluate further.  She expressed understanding  of all the recommendations.  She had a mammogram and bone density at [facility], according to patient's verbal report mammogram is negative and the bone density showed osteopenia.  We have discussed about vitamin D supplementation, weightbearing exercises and repeating bone density in 2 years. °She will return to clinic in 3 months ° °Total encounter time 30 minutes.* ° °. ° ° °*Total Encounter Time as defined by the Centers for Medicare and Medicaid Services includes, in addition to the face-to-face time of a patient visit (documented in the note above) non-face-to-face time: obtaining and reviewing outside history, ordering and reviewing medications, tests or procedures, care coordination (communications with other health care professionals or caregivers) and documentation in the medical record. °

## 2021-02-25 ENCOUNTER — Telehealth: Payer: Self-pay | Admitting: *Deleted

## 2021-02-25 NOTE — Telephone Encounter (Signed)
-----   Message from Benay Pike, MD sent at 02/25/2021 12:44 PM EST ----- Meron Bocchino,  Can you convey the X ray results to the patient. Please ask her to inform us if the pain continues to bother her, we can order a bone scan. I already mentioned the plan briefly with her before the X ray was done  Thanks

## 2021-02-25 NOTE — Telephone Encounter (Signed)
LM with note below 

## 2021-02-26 ENCOUNTER — Encounter (HOSPITAL_COMMUNITY): Payer: Self-pay

## 2021-03-01 ENCOUNTER — Other Ambulatory Visit (HOSPITAL_COMMUNITY): Payer: Self-pay

## 2021-03-05 ENCOUNTER — Encounter: Payer: Self-pay | Admitting: Oncology

## 2021-03-05 ENCOUNTER — Ambulatory Visit (HOSPITAL_COMMUNITY)
Admission: RE | Admit: 2021-03-05 | Discharge: 2021-03-05 | Disposition: A | Discharge status: Home / Self Care | Payer: Commercial Managed Care - HMO | Source: Ambulatory Visit | Attending: Internal Medicine | Admitting: Internal Medicine

## 2021-03-05 ENCOUNTER — Other Ambulatory Visit: Payer: Self-pay

## 2021-03-05 DIAGNOSIS — K219 Gastro-esophageal reflux disease without esophagitis: Secondary | ICD-10-CM | POA: Insufficient documentation

## 2021-03-05 DIAGNOSIS — K449 Diaphragmatic hernia without obstruction or gangrene: Secondary | ICD-10-CM | POA: Diagnosis present

## 2021-03-05 DIAGNOSIS — R1112 Projectile vomiting: Secondary | ICD-10-CM | POA: Diagnosis present

## 2021-03-21 ENCOUNTER — Other Ambulatory Visit (HOSPITAL_COMMUNITY): Payer: Self-pay

## 2021-03-28 ENCOUNTER — Telehealth: Payer: Self-pay

## 2021-03-28 NOTE — Telephone Encounter (Signed)
Attempted to contact Patient regarding request for medical records from [facility] # [id number]. Left voicemail explaining that a new signed Release of Information was needed before records could be released by [facility].  ?

## 2021-04-22 ENCOUNTER — Other Ambulatory Visit (HOSPITAL_COMMUNITY): Payer: Self-pay

## 2021-04-22 ENCOUNTER — Other Ambulatory Visit: Payer: Self-pay | Admitting: Internal Medicine

## 2021-04-23 ENCOUNTER — Other Ambulatory Visit (HOSPITAL_COMMUNITY): Payer: Self-pay

## 2021-04-23 ENCOUNTER — Ambulatory Visit: Payer: Managed Care, Other (non HMO) | Admitting: Internal Medicine

## 2021-04-23 MED ORDER — OMEPRAZOLE 40 MG PO CPDR
DELAYED_RELEASE_CAPSULE | ORAL | 0 refills | Status: DC
Start: 2021-04-23 — End: 2021-06-29
  Filled 2021-04-23 – 2021-05-06 (×2): qty 180, 90d supply, fill #0

## 2021-04-29 ENCOUNTER — Telehealth: Payer: Self-pay | Admitting: Hematology and Oncology

## 2021-04-29 NOTE — Telephone Encounter (Signed)
Called patient regarding upcoming appointments, left a voicemail. 

## 2021-05-02 ENCOUNTER — Other Ambulatory Visit (HOSPITAL_COMMUNITY): Payer: Self-pay

## 2021-05-06 ENCOUNTER — Other Ambulatory Visit (HOSPITAL_COMMUNITY): Payer: Self-pay

## 2021-05-08 ENCOUNTER — Other Ambulatory Visit: Payer: Self-pay

## 2021-05-08 ENCOUNTER — Other Ambulatory Visit (HOSPITAL_COMMUNITY): Payer: Self-pay

## 2021-05-10 ENCOUNTER — Other Ambulatory Visit (HOSPITAL_COMMUNITY): Payer: Self-pay

## 2021-05-16 ENCOUNTER — Other Ambulatory Visit (HOSPITAL_COMMUNITY): Payer: Self-pay

## 2021-05-17 ENCOUNTER — Encounter: Payer: Self-pay | Admitting: Oncology

## 2021-05-20 ENCOUNTER — Telehealth: Payer: Self-pay | Admitting: Hematology and Oncology

## 2021-05-20 ENCOUNTER — Other Ambulatory Visit (HOSPITAL_COMMUNITY): Payer: Self-pay

## 2021-05-20 NOTE — Telephone Encounter (Signed)
.  Called patient to schedule appointment per 5/5 inbasket, patient is aware of date and time.   ?

## 2021-05-22 ENCOUNTER — Other Ambulatory Visit (HOSPITAL_COMMUNITY): Payer: Self-pay

## 2021-05-22 ENCOUNTER — Inpatient Hospital Stay: Payer: Commercial Managed Care - HMO | Admitting: Hematology and Oncology

## 2021-05-25 ENCOUNTER — Other Ambulatory Visit (HOSPITAL_COMMUNITY): Payer: Self-pay

## 2021-05-25 ENCOUNTER — Other Ambulatory Visit: Payer: Self-pay | Admitting: Oncology

## 2021-05-27 ENCOUNTER — Other Ambulatory Visit (HOSPITAL_COMMUNITY): Payer: Self-pay

## 2021-05-29 ENCOUNTER — Other Ambulatory Visit (HOSPITAL_COMMUNITY): Payer: Self-pay

## 2021-06-03 ENCOUNTER — Other Ambulatory Visit (HOSPITAL_COMMUNITY): Payer: Self-pay

## 2021-06-03 ENCOUNTER — Encounter: Payer: Self-pay | Admitting: Oncology

## 2021-06-03 MED ORDER — TEMAZEPAM 15 MG PO CAPS
15.0000 mg | ORAL_CAPSULE | Freq: Every evening | ORAL | 0 refills | Status: DC | PRN
  Filled 2021-06-03: qty 60, 30d supply, fill #0

## 2021-06-05 ENCOUNTER — Other Ambulatory Visit (HOSPITAL_COMMUNITY): Payer: Self-pay

## 2021-06-12 ENCOUNTER — Other Ambulatory Visit (HOSPITAL_COMMUNITY): Payer: Self-pay

## 2021-06-12 ENCOUNTER — Telehealth: Payer: Self-pay

## 2021-06-12 MED ORDER — HYDROCODONE BIT-HOMATROP MBR 5-1.5 MG/5ML PO SOLN
ORAL | 0 refills | Status: DC
  Filled 2021-06-12: qty 120, 6d supply, fill #0

## 2021-06-12 NOTE — Telephone Encounter (Signed)
[employer] re-sent [place] forms to be done for [employer]. I have called her and left her a message to call me back. I have questions for her in regards to the form.

## 2021-06-14 ENCOUNTER — Other Ambulatory Visit (HOSPITAL_COMMUNITY): Payer: Self-pay

## 2021-06-14 MED ORDER — ROSUVASTATIN CALCIUM 5 MG PO TABS
ORAL_TABLET | ORAL | 2 refills | Status: DC
  Filled 2021-06-14: qty 30, 30d supply, fill #0
  Filled 2021-07-31: qty 30, 30d supply, fill #1
  Filled 2021-09-03: qty 30, 30d supply, fill #2

## 2021-06-14 NOTE — Telephone Encounter (Signed)
I have left her another message to please call me back Monday AM as Dr Carlean Purl is only here Sammuel Cooper, Wed and her form is due 06/25/2021.

## 2021-06-16 IMAGING — MR MR BREAST BILAT WO/W CM
8 of 14 series · 31 of 48 positions shown · IV contrast (gadavist)
Comparison: Previous exams.

CLINICAL DATA: 59-year-old female with recently diagnosed grade 3
invasive ductal carcinoma of the left breast post ultrasound-guided
biopsy of a 1.7 cm mass at the 8 o'clock position 6 cm from nipple.
Family history of breast cancer with patient's mother having been
diagnosed with breast cancer.

LABS:  Not applicable.
EXAM:
BILATERAL BREAST MRI WITH AND WITHOUT CONTRAST
TECHNIQUE: Multiplanar, multisequence MR images of both breasts were obtained
prior to and following the intravenous administration of 6 ml of
Gadavist

[Series 3: fl3d pre-cm no · axial · non-contrast · 1.2mm · 0.94mm/px · z∈[-67,+105]mm · 5 of 144 slices shown]
[im 1/144]
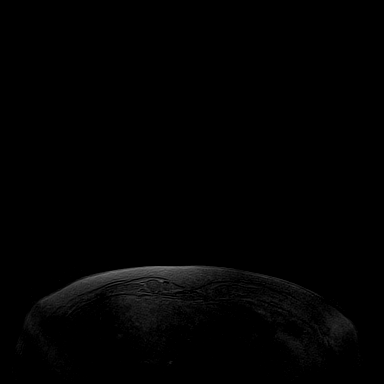
[im 36/144]
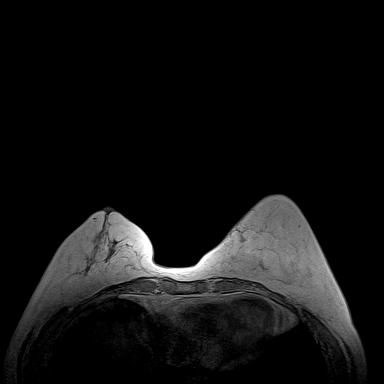
[im 72/144]
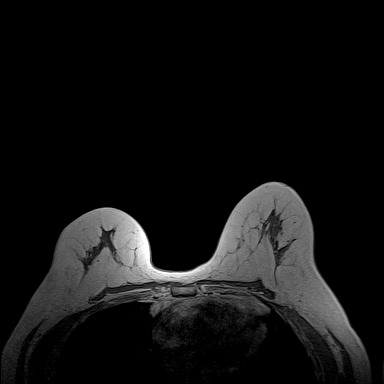
[im 108/144]
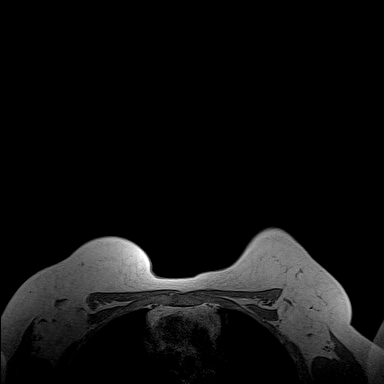
[im 144/144]
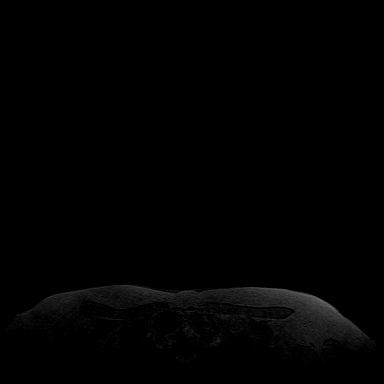

[Series 4: t2_tirm_tra ipat (a-p) · axial · 3.0mm · 0.70mm/px · 1 of 55 slices shown]
[im 1/55]
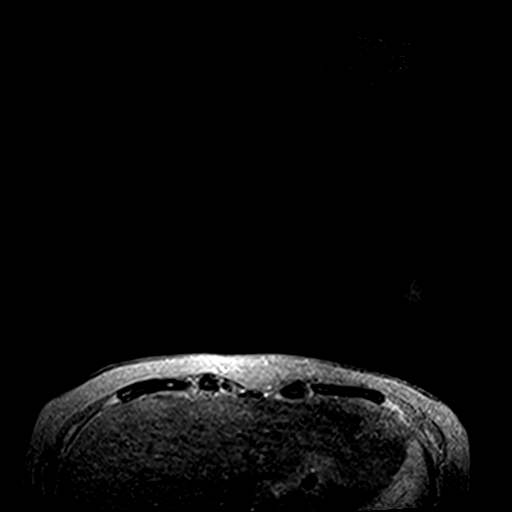

[Series 6: fl3d pre-cm · axial · non-contrast · 1.2mm · 0.94mm/px · z∈[-67,+105]mm · 5 of 144 slices shown]
[im 1/144]
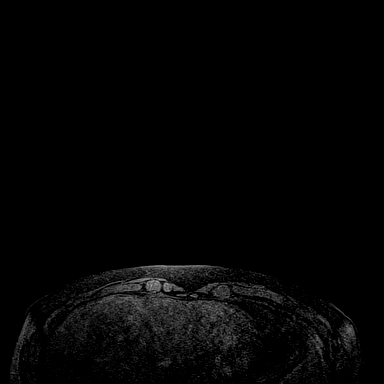
[im 36/144]
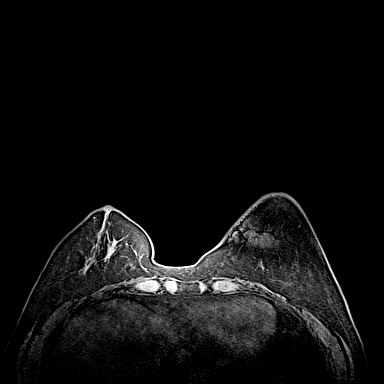
[im 72/144]
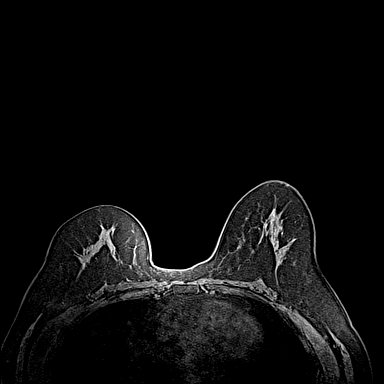
[im 108/144]
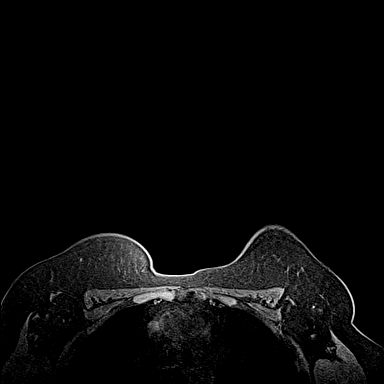
[im 144/144]
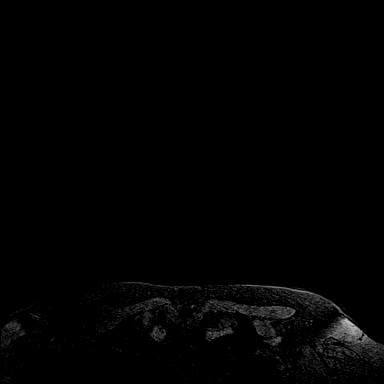

[Series 7: fl3d post-cm 20 · axial · 1.2mm · 0.94mm/px · z∈[-67,+105]mm · 5 of 144 slices shown (1 of 3)]
[im 1/144]
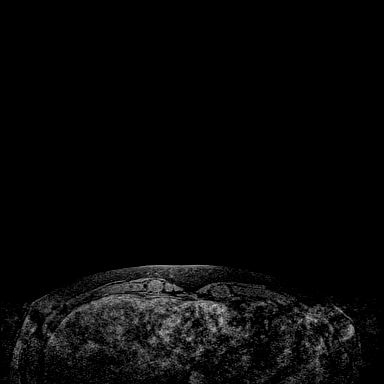
[im 36/144]
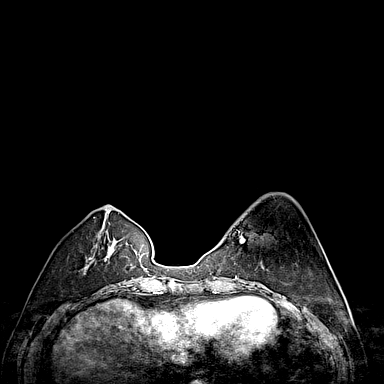
[im 72/144]
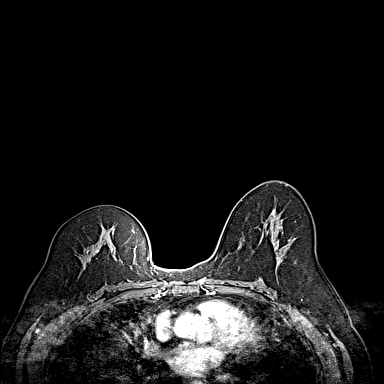
[im 108/144]
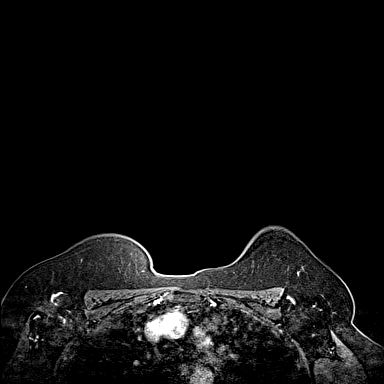
[im 144/144]
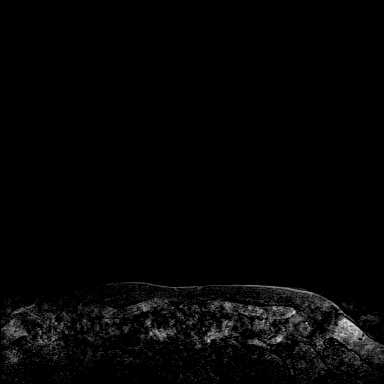

[Series 8: fl3d post-cm 20 · axial · 1.2mm · 0.94mm/px · z∈[-67,+105]mm · 5 of 144 slices shown (2 of 3)]
[im 1/144]
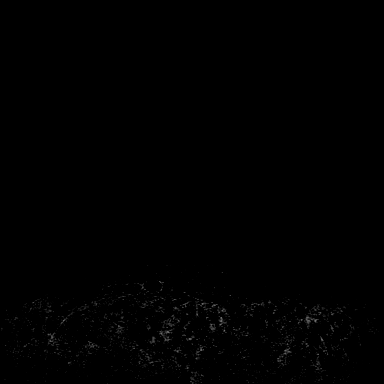
[im 36/144]
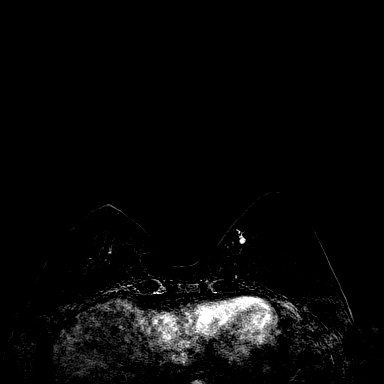
[im 72/144]
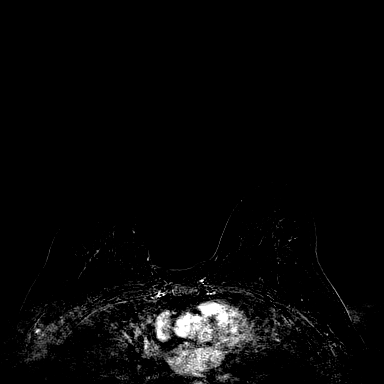
[im 108/144]
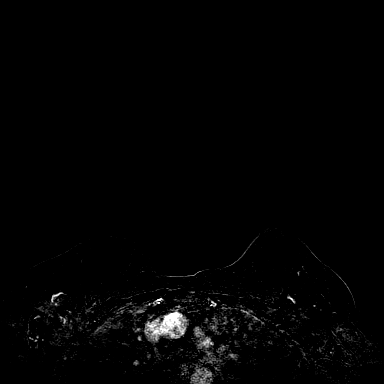
[im 144/144]
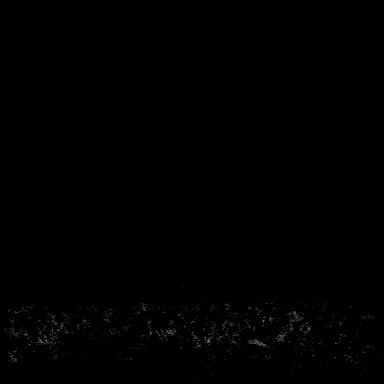

[Series 9: fl3d post-cm 20 · axial · 172.8mm · 0.94mm/px · 1 of 1 slices shown (3 of 3)]
[im 1/1]
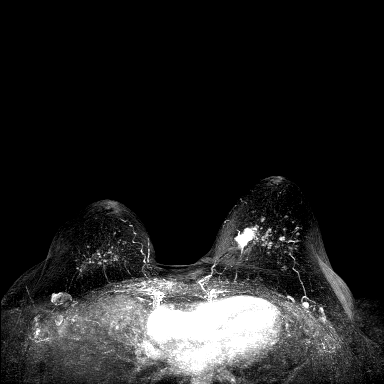

[Series 10: fl3d post-cm 3min · axial · 1.2mm · 0.94mm/px · z∈[-67,+105]mm · 5 of 144 slices shown]
[im 1/144]
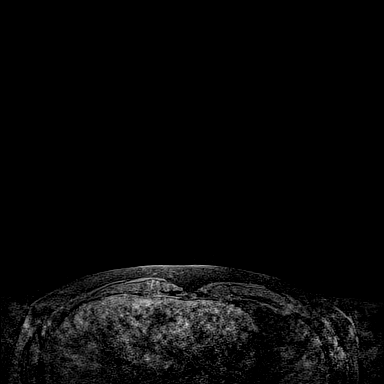
[im 36/144]
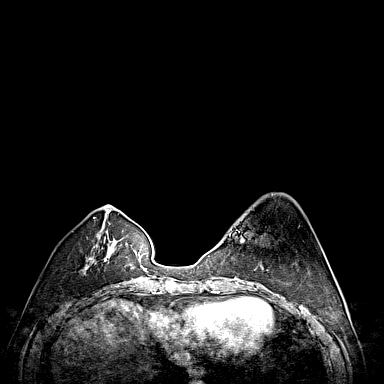
[im 72/144]
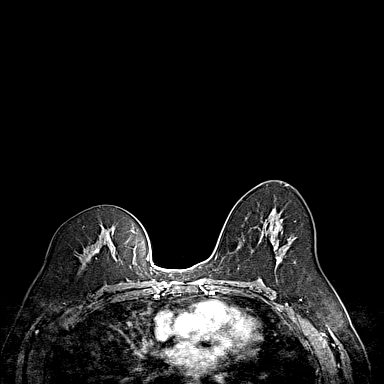
[im 108/144]
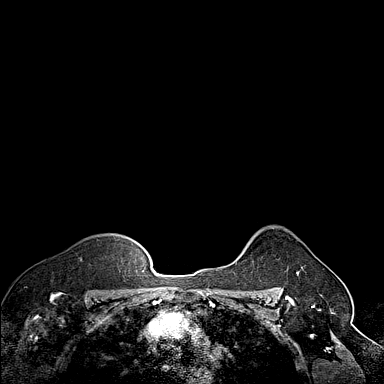
[im 144/144]
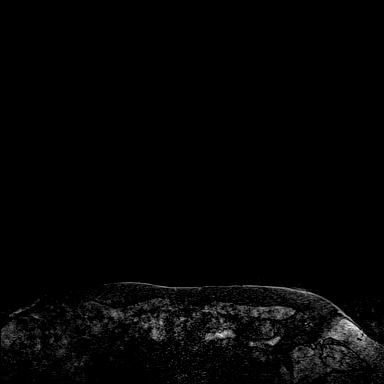

[Series 11: fl3d post-cm 3min_sub · axial · 1.2mm · 0.94mm/px · z∈[-67,+62]mm · 4 of 144 slices shown]
[im 1/144]
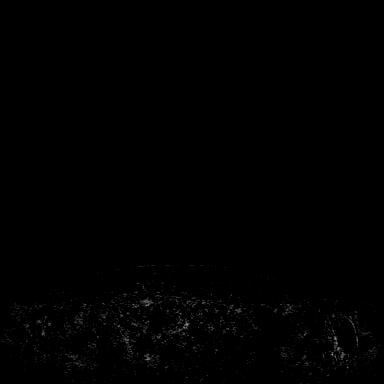
[im 36/144]
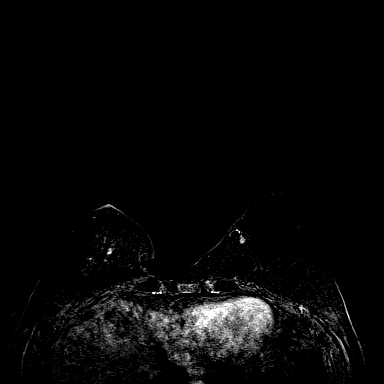
[im 72/144]
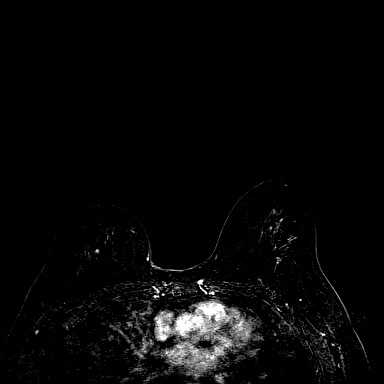
[im 108/144]
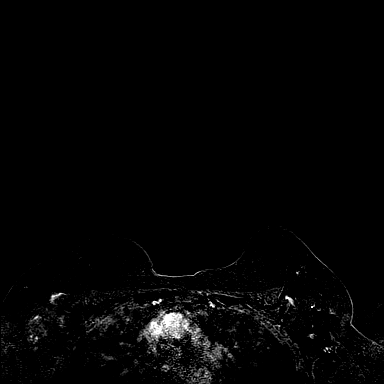

[31 of 48 positions shown; findings below may reference images not displayed]

Three-dimensional MR images were rendered by post-processing of the
original MR data on an independent workstation. The
three-dimensional MR images were interpreted, and findings are
reported in the following complete MRI report for this study. Three
dimensional images were evaluated at the independent [employer]
workstation
FINDINGS: Breast composition: b.  Scattered fibroglandular tissue.

Background parenchymal enhancement: There is mild to moderate
background parenchymal enhancement with multiple scattered enhancing
foci in each breast which are considered benign given multiplicity
and bilaterality.

Right breast: No mass or abnormal enhancement.

Left breast: Irregular enhancing mass in the lower inner left breast
measures 2.7 cm AP, 1.9 cm transverse and 1.2 cm craniocaudal.
Biopsy marking clip artifact is present in the superior portion of
this mass. There is linear oriented clumped non mass enhancement
within the upper central left breast mid depth (subtraction image
83) measuring 1.6 cm. There are several enhancing masses in the
central left breast mid to posterior depth and predominantly located
lateral to the dominant mass (subtraction images 99 through 101),
with these masses all together measuring 2.9 cm transverse and
cm craniocaudal. These are suspicious in appearance and not
definitely related to normal background enhancement. An additional
oval enhancing mass in the lower central left breast mid depth
(subtraction image 106) measures 0.7 cm.

Lymph nodes: No morphologically abnormal axillary lymph nodes. No
internal mammary lymphadenopathy seen.

Ancillary findings:  None.
IMPRESSION: 1. Biopsy proven malignancy in the lower inner left breast measures
2.7 x 1.9 x 1.2 cm.

2. 1.6 cm linear clumped non mass enhancement in the central upper
left breast (subtraction image 83)

3. Multiple enhancing masses in the central left breast all together
measuring 2.9 x 2.7 cm (subtraction images 99-101).

4. 0.7 cm enhancing mass in the lower central left breast
(subtraction image 106).

5.  No MRI evidence of malignancy in the right breast.

RECOMMENDATION:
If breast conservation is a consideration, then suggest the patient
return for targeted ultrasound to evaluate for a sonographic
correlate (and subsequent biopsies) for the enhancing masses and
clumped enhancement in the left breast. If these areas cannot be
identified sonographically, then recommend MRI guided biopsy of at
least 2 of these additional suspicious sites in the left breast.

BI-RADS CATEGORY  4: Suspicious.

## 2021-06-17 IMAGING — XA IR IMAGING GUIDED PORT INSERTION
1 series · 1 of 1 positions shown · non-contrast
Comparison: none

INDICATION: 59-year-old female with a history of breast carcinoma

[Series 1: fl - angio · 1 of 1 slices shown]
[im 1/1]
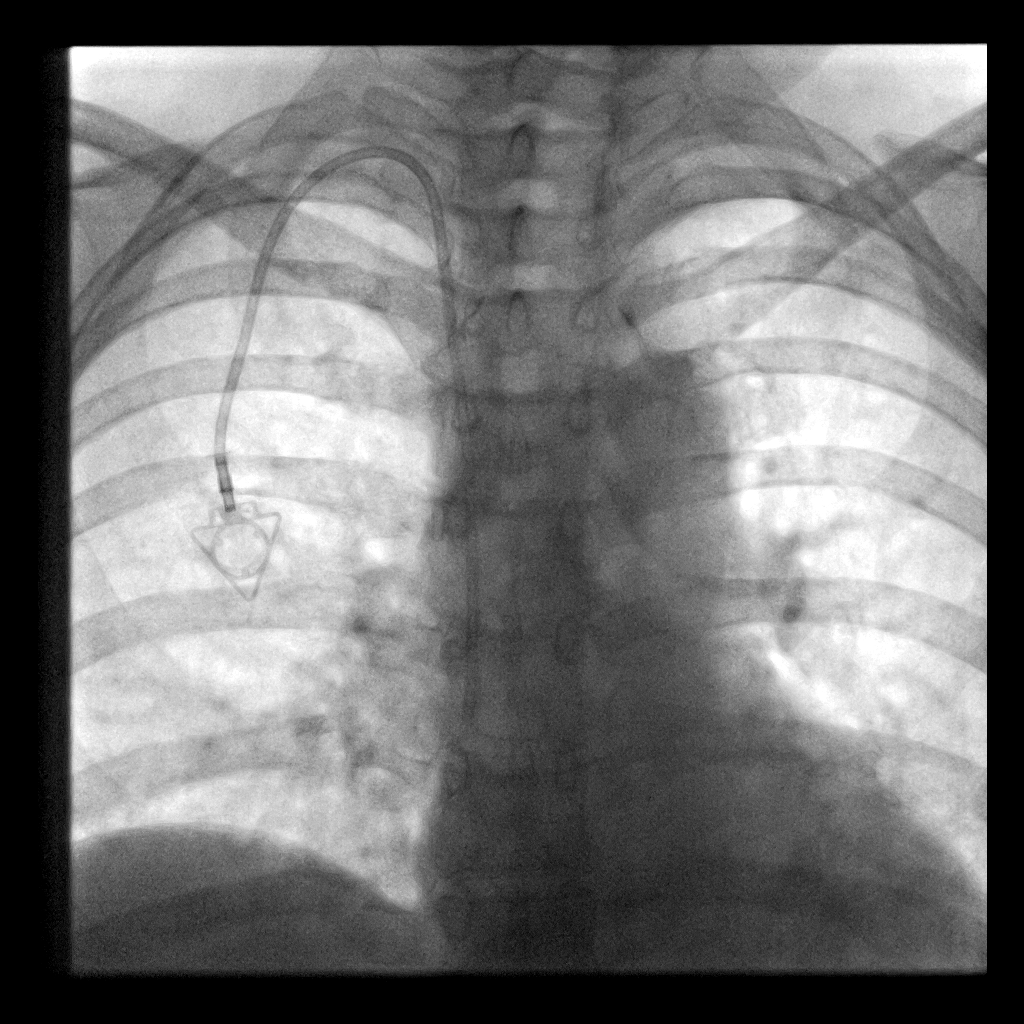

[1 of 1 positions shown; findings below may reference images not displayed]

EXAM:
IMAGE GUIDED PORT CATHETER PLACEMENT

MEDICATIONS:
2 g Ancef; The antibiotic was administered within an appropriate
time interval prior to skin puncture.

ANESTHESIA/SEDATION:
Moderate (conscious) sedation was not employed during this
procedure. 1 mg Dilaudid. 25 mg Benadryl

Moderate Sedation Time: 0 minutes. The patient's level of
consciousness and vital signs were monitored continuously by
radiology nursing throughout the procedure under my direct
supervision.

FLUOROSCOPY TIME:  Fluoroscopy Time: 0 minutes 6 seconds (1 mGy).

COMPLICATIONS:
None

PROCEDURE:
The procedure, risks, benefits, and alternatives were explained to
the patient. Questions regarding the procedure were encouraged and
answered. The patient understands and consents to the procedure.

Ultrasound survey was performed with images stored and sent to PACs.

The right neck and chest was prepped with chlorhexidine, and draped
in the usual sterile fashion using maximum barrier technique (cap
and mask, sterile gown, sterile gloves, large sterile sheet, hand
hygiene and cutaneous antiseptic). Antibiotic prophylaxis was
provided with 2.0g Ancef administered IV one hour prior to skin
incision. Local anesthesia was attained by infiltration with 1%
lidocaine without epinephrine.

Ultrasound demonstrated patency of the right internal jugular vein,
and this was documented with an image. Under real-time ultrasound
guidance, this vein was accessed with a 21 gauge micropuncture
needle and image documentation was performed. A small dermatotomy
was made at the access site with an 11 scalpel. A 0.018" wire was
advanced into the SVC and used to estimate the length of the
internal catheter. The access needle exchanged for a 4F
micropuncture vascular sheath. The 0.018" wire was then removed and
a 0.035" wire advanced into the IVC.



The venous access site was then serially dilated and a peel away
vascular sheath placed over the wire. The wire was removed and the
port catheter advanced into position under fluoroscopic guidance.
The catheter tip is positioned in the cavoatrial junction. This was
documented with a spot image. The portacatheter was then tested and
found to flush and aspirate well.

The pocket was then closed in two layers using first subdermal
inverted interrupted absorbable sutures followed by a running
subcuticular suture. The epidermis was then sealed with Dermabond.
The dermatotomy at the venous access site was also seal with
Dermabond.

Finally, the Gai Xom needle access was placed and the port was again
flushed.

Patient tolerated the procedure well and remained hemodynamically
stable throughout.

No complications encountered and no significant blood loss
encountered
IMPRESSION: Status post right IJ port catheter placement.

## 2021-06-18 NOTE — Telephone Encounter (Signed)
I left Kathy Barker another detailed message to please call me to discuss her form that is due soon and to also make a f/u appointment with Dr Carlean Purl.

## 2021-06-20 ENCOUNTER — Ambulatory Visit: Payer: Commercial Managed Care - HMO | Admitting: Hematology and Oncology

## 2021-06-21 ENCOUNTER — Other Ambulatory Visit (HOSPITAL_COMMUNITY): Payer: Self-pay

## 2021-06-29 ENCOUNTER — Other Ambulatory Visit: Payer: Self-pay | Admitting: Internal Medicine

## 2021-07-01 ENCOUNTER — Other Ambulatory Visit (HOSPITAL_COMMUNITY): Payer: Self-pay

## 2021-07-01 MED ORDER — OMEPRAZOLE 40 MG PO CPDR
DELAYED_RELEASE_CAPSULE | ORAL | 0 refills | Status: DC
  Filled 2021-07-01: qty 180, fill #0
  Filled 2021-07-05 – 2021-07-19 (×3): qty 180, 90d supply, fill #0

## 2021-07-03 NOTE — Telephone Encounter (Signed)
I am mailing her a letter to call me since I have been unable to reach her by phone.

## 2021-07-05 ENCOUNTER — Other Ambulatory Visit (HOSPITAL_COMMUNITY): Payer: Self-pay

## 2021-07-08 ENCOUNTER — Inpatient Hospital Stay: Payer: Commercial Managed Care - HMO | Attending: Hematology and Oncology | Admitting: Hematology and Oncology

## 2021-07-11 ENCOUNTER — Other Ambulatory Visit (HOSPITAL_COMMUNITY): Payer: Self-pay

## 2021-07-11 MED ORDER — TEMAZEPAM 15 MG PO CAPS
ORAL_CAPSULE | ORAL | 0 refills | Status: DC
  Filled 2021-07-11: qty 60, 30d supply, fill #0

## 2021-07-11 NOTE — Telephone Encounter (Signed)
I spoke with Maudie Mercury who said she had been trying to call me back and was on hold forever and she left messages I never got. I apologized. She said she doesn't need disability forms from [place] that Dr Brigitte Pulse did them and also another Dr she has. She made an August appointment.

## 2021-07-15 ENCOUNTER — Other Ambulatory Visit (HOSPITAL_COMMUNITY): Payer: Self-pay

## 2021-07-19 ENCOUNTER — Other Ambulatory Visit (HOSPITAL_COMMUNITY): Payer: Self-pay

## 2021-07-22 ENCOUNTER — Telehealth: Payer: Self-pay

## 2021-07-22 NOTE — Telephone Encounter (Signed)
Kathy Barker is emailing her form to me. Her disability insurance company needs paperwork and records from 08/2020 - present in order for her benefits not to be cancelled. The paperwork is due by July 14th 2023. I will place on Dr Celesta Aver desk for signature.

## 2021-07-23 ENCOUNTER — Telehealth: Payer: Self-pay

## 2021-07-23 NOTE — Telephone Encounter (Signed)
Paperwork signed and faxed to [employer], fax # [phone]. Got confirmation it went thru. I called and left [employer] a detailed message that this has been done. Will have the paperwork scanned into epic.

## 2021-07-31 ENCOUNTER — Encounter: Payer: Self-pay | Admitting: Oncology

## 2021-07-31 ENCOUNTER — Other Ambulatory Visit (HOSPITAL_COMMUNITY): Payer: Self-pay

## 2021-08-05 ENCOUNTER — Other Ambulatory Visit (HOSPITAL_COMMUNITY): Payer: Self-pay

## 2021-08-07 ENCOUNTER — Other Ambulatory Visit (HOSPITAL_COMMUNITY): Payer: Self-pay

## 2021-08-07 MED ORDER — HYDROCODONE BIT-HOMATROP MBR 5-1.5 MG/5ML PO SOLN
ORAL | 0 refills | Status: DC
  Filled 2021-08-07: qty 120, 6d supply, fill #0

## 2021-08-19 ENCOUNTER — Telehealth: Payer: Self-pay | Admitting: Hematology and Oncology

## 2021-08-19 NOTE — Telephone Encounter (Signed)
Scheduled per 8/4 in basket, message has been left  

## 2021-08-20 ENCOUNTER — Other Ambulatory Visit (HOSPITAL_COMMUNITY): Payer: Self-pay

## 2021-08-21 ENCOUNTER — Other Ambulatory Visit (HOSPITAL_COMMUNITY): Payer: Self-pay

## 2021-08-21 ENCOUNTER — Encounter: Payer: Self-pay | Admitting: Internal Medicine

## 2021-08-21 ENCOUNTER — Ambulatory Visit: Payer: Commercial Managed Care - HMO | Admitting: Internal Medicine

## 2021-08-21 VITALS — BP 120/74 | HR 95 | Ht 62.0 in | Wt 134.0 lb

## 2021-08-21 DIAGNOSIS — R1112 Projectile vomiting: Secondary | ICD-10-CM | POA: Diagnosis not present

## 2021-08-21 DIAGNOSIS — Z1211 Encounter for screening for malignant neoplasm of colon: Secondary | ICD-10-CM

## 2021-08-21 DIAGNOSIS — G620 Drug-induced polyneuropathy: Secondary | ICD-10-CM

## 2021-08-21 DIAGNOSIS — K219 Gastro-esophageal reflux disease without esophagitis: Secondary | ICD-10-CM

## 2021-08-21 MED ORDER — METOCLOPRAMIDE HCL 10 MG PO TABS
ORAL_TABLET | ORAL | 2 refills | Status: DC
Start: 2021-08-21 — End: 2021-10-20
  Filled 2021-08-21: qty 90, 23d supply, fill #0

## 2021-08-21 MED ORDER — PLENVU 140 G PO SOLR: 1.0000 | ORAL | 0 refills | Status: DC

## 2021-08-21 NOTE — Patient Instructions (Signed)
You have been scheduled for a colonoscopy. Please follow written instructions given to you at your visit today.  Please use the plenvu sample kit you were given today. If you use inhalers (even only as needed), please bring them with you on the day of your procedure.  We have sent the following medications to your pharmacy for you to pick up at your convenience: Generic reglan. You may take one reglan an hour prior to each prep time to prevent nausea.  Follow step 3 in the gastroparesis diet handout.  I appreciate the opportunity to care for you. Silvano Rusk, MD, [facility]

## 2021-08-21 NOTE — Progress Notes (Cosign Needed)
Kathy Barker 61 y.o. [birth year] [id number]  Assessment & Plan:   Encounter Diagnoses  Name Primary?   Gastroesophageal reflux disease, unspecified whether esophagitis present Yes   Projectile vomiting with nausea    Chemotherapy-induced neuropathy (HCC)    Colon cancer screening    Continue current medications but add as needed Reglan/metoclopramide prior to these episodes to see if we can abort them.  It sounds like she has some gastric dysfunction she could have gastroparesis and it might be related to chemotherapy neuropathy.  She does say she has always had a terrible gag reflex and easily vomited over the years.  I do not see signs of tar dive dyskinesia I do not think what she is describing with her lips is that but we reviewed it I showed her photos of what it looks like and she will monitor.   We discussed gastric emptying study, I told her I did not think it would change our treatment plan so we have decided not to pursue.    Though she is still symptomatic she is improved.   She is due for a screening colonoscopy we will schedule that, Plenvu samples given informed consent/risks benefits indications reviewed.  To use Reglan prior to each session of her prep.  Meds ordered this encounter  Medications   metoCLOPramide (REGLAN) 10 MG tablet    Sig: Take 1 tablet by mouth 3 times daily and at bedtime as needed for nausea    Dispense:  90 tablet    Refill:  2   PEG-KCl-NaCl-NaSulf-Na Asc-C (PLENVU) 140 g SOLR    Sig: Take 1 kit by mouth as directed.    Dispense:  1 each    Refill:  0   CC: Kathy Neer, MD    Subjective:   Chief Complaint: Follow-up of GERD/vomiting  HPI 61 year old woman with a history of GERD problems, prior dysphagia and projectile vomiting that started during/after chemotherapy for breast cancer, she also has chemotherapy-induced neuropathy.  She was last seen in January and an upper GI series was ordered and performed in February.   She had mild reflux on this study, there were 2 small to moderate duodenal diverticula noted in the second and third portions of the duodenum and it was otherwise normal and she swallowed a 13 mm barium tablet without difficulty.  At that last visit I had put her on her metoclopramide at bedtime, she had been complaining of frequent nocturnal projectile vomiting issues.  She continued on twice daily pantoprazole before breakfast and before supper though as opposed to at bedtime.  She still has problems but says she is better.  Much less of the projectile vomiting though she will have 2 or 3 episodes a week several hours after eating where she will feel bloated and distended, with a "knot" in the epigastrium and sometimes vomit partially digested or undigested food.  Recall that EGD in August 2022 showed the following:   - Benign-appearing esophageal stenosis. Dilated. Biopsied.-No pathology found - 5 cm hiatal hernia. - Gastroesophageal flap valve classified as Hill Grade IV (no fold, wide open lumen, hiatal hernia present). - Duodenitis. Biopsied.-Nonspecific inflammation - The examination was otherwise normal. 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  Says she is searching for a job where she is not on her feet.  She says she has some issues where her lips feel irritated and she makes some intermittent automatic type movements in this area but in talking to her I do not hear (or see) any thing consistent with tar dive dyskinesia.  She reports having had bad mucositis and other problems in the oral and lip area with the chemotherapy and wonders if what she is  describing could be related to that.   Allergies  Allergen Reactions   Fentanyl Nausea And Vomiting    Per patient " extreme nausea and vomiting "    Other Other (See Comments)    Problems with stitches   Simvastatin Other (See Comments)   Venlafaxine Other (See Comments)   Versed [Midazolam] Nausea And Vomiting    Per patient " extreme N/V"    Latex Rash   Current Meds  Medication Sig   HYDROcodone bit-homatropine (HYCODAN) 5-1.5 MG/5ML syrup Take 5 mls by mouth every 6 hours as needed (1 hour before meal)   omeprazole (PRILOSEC) 40 MG capsule Take 1 capsule by mouth in the morning 30 minutes before breakfast and 1 capsule 30 minutes prior to supper.       rosuvastatin (CRESTOR) 5 MG tablet Take 1 tablet by mouth once a day   sertraline (ZOLOFT) 100 MG tablet Take 100 mg by mouth daily.   temazepam (RESTORIL) 15 MG capsule Take 1-2 capsules (15-30 mg total) by mouth at bedtime as needed.   [metoCLOPramide (REGLAN) 10 MG tablet Take 1 tablet (10 mg total) by mouth at bedtime.   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 by Dr Rayann Heman   Past Surgical History:  Procedure Laterality Date   BREAST LUMPECTOMY WITH RADIOACTIVE SEED AND SENTINEL LYMPH NODE BIOPSY Left 12/14/2019   Procedure: LEFT BREAST LUMPECTOMY X 2 WITH RADIOACTIVE SEED AND SENTINEL LYMPH NODE BIOPSY;  Surgeon: [name], MD;  Location:  [facility];  Service: General;  Laterality: Left;  PEC BLOCK   CESAREAN SECTION  01/13/1990   COLOSTOMY     COMBINED HYSTERECTOMY VAGINAL / OOPHORECTOMY / A&P REPAIR  01/13/1997   IR IMAGING GUIDED PORT INSERTION  06/30/2019   IR REMOVAL TUN ACCESS W/ PORT W/O FL MOD SED  05/24/2020   SUPRAVENTRICULAR TACHYCARDIA ABLATION  11/18/2012   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   SUPRAVENTRICULAR TACHYCARDIA ABLATION N/A 11/18/2012   Procedure: SUPRAVENTRICULAR TACHYCARDIA ABLATION;  Surgeon: Coralyn Mark, MD;  Location: [facility];  Service: Cardiovascular;  Laterality: N/A;   UPPER GASTROINTESTINAL ENDOSCOPY  VAGINAL HYSTERECTOMY  ~ 2000   Social History   Social History Narrative   She is divorced and has 2 adult sons.  1 son still lives with her.   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   family history includes Alcohol abuse in her father; Breast cancer in her mother; Heart Problems in an other family member; Heart attack in her father and paternal grandfather.   Review of Systems  As above Objective:   Physical Exam _0  120/74   Pulse 95   Ht _1  (1.575 m)   Wt 134 lb (60.8 kg)   BMI 24.51 kg/m @  General:  NAD Eyes:   anicteric Lungs:  clear Heart::  S1S2 no rubs, murmurs or gallops Abdomen:  soft and nontender, BS+ no splash Ext:   no edema, cyanosis or clubbing  No signs tardive dyskinesia  Data Reviewed:   As above

## 2021-08-23 ENCOUNTER — Other Ambulatory Visit (HOSPITAL_COMMUNITY): Payer: Self-pay

## 2021-08-23 MED ORDER — TEMAZEPAM 15 MG PO CAPS
ORAL_CAPSULE | ORAL | 0 refills | Status: DC
Start: 2021-08-23 — End: 2021-10-10
  Filled 2021-08-23: qty 60, 30d supply, fill #0

## 2021-08-26 ENCOUNTER — Other Ambulatory Visit (HOSPITAL_COMMUNITY): Payer: Self-pay

## 2021-08-26 ENCOUNTER — Encounter: Payer: Self-pay | Admitting: Internal Medicine

## 2021-09-02 ENCOUNTER — Encounter: Payer: Self-pay | Admitting: Internal Medicine

## 2021-09-02 ENCOUNTER — Ambulatory Visit (AMBULATORY_SURGERY_CENTER): Payer: Commercial Managed Care - HMO | Admitting: Internal Medicine

## 2021-09-02 VITALS — BP 151/67 | HR 79 | Temp 98.4°F | Resp 16 | Ht 62.0 in | Wt 134.0 lb

## 2021-09-02 DIAGNOSIS — Z1211 Encounter for screening for malignant neoplasm of colon: Secondary | ICD-10-CM | POA: Diagnosis present

## 2021-09-02 MED ORDER — SODIUM CHLORIDE 0.9 % IV SOLN: 500.0000 mL | Freq: Once | INTRAVENOUS | Status: DC

## 2021-09-02 NOTE — Progress Notes (Signed)
Report to pacu rn. Vss. Care resumed by rn. 

## 2021-09-02 NOTE — Progress Notes (Signed)
History and Physical Interval Note:  09/02/2021 11:11 AM  Kathy Barker  has presented today for endoscopic procedure(s), with the diagnosis of  Encounter Diagnosis  Name Primary?   Colon cancer screening Yes  .  The various methods of evaluation and treatment have been discussed with the patient and/or family. After consideration of risks, benefits and other options for treatment, the patient has consented to  the endoscopic procedure(s).   The patient's history has been reviewed, patient examined, no change in status, stable for endoscopic procedure(s).  I have reviewed the patient's chart and labs.  Questions were answered to the patient's satisfaction.     Gatha Mayer, MD, [facility]

## 2021-09-02 NOTE — Patient Instructions (Addendum)
Colonoscopy was normal.  Next routine colonoscopy or other screening test in 10 years - 2033.  I appreciate the opportunity to care for you. Gatha Mayer, MD, FACG  YOU HAD AN ENDOSCOPIC PROCEDURE TODAY AT [facility]:   Refer to the procedure report that was given to you for any specific questions about what was found during the examination.  If the procedure report does not answer your questions, please call your gastroenterologist to clarify.  If you requested that your care partner not be given the details of your procedure findings, then the procedure report has been included in a sealed envelope for you to review at your convenience later.  YOU SHOULD EXPECT: Some feelings of bloating in the abdomen. Passage of more gas than usual.  Walking can help get rid of the air that was put into your GI tract during the procedure and reduce the bloating. If you had a lower endoscopy (such as a colonoscopy or flexible sigmoidoscopy) you may notice spotting of blood in your stool or on the toilet paper. If you underwent a bowel prep for your procedure, you may not have a normal bowel movement for a few days.  Please Note:  You might notice some irritation and congestion in your nose or some drainage.  This is from the oxygen used during your procedure.  There is no need for concern and it should clear up in a day or so.  SYMPTOMS TO REPORT IMMEDIATELY:  Following lower endoscopy (colonoscopy or flexible sigmoidoscopy):  Excessive amounts of blood in the stool  Significant tenderness or worsening of abdominal pains  Swelling of the abdomen that is new, acute  Fever of 100F or higher  For urgent or emergent issues, a gastroenterologist can be reached at any hour by calling [phone]. Do not use MyChart messaging for urgent concerns.    DIET:  We do recommend a small meal at first, but then you may proceed to your regular diet.  Drink plenty of fluids but you should avoid  alcoholic beverages for 24 hours.  ACTIVITY:  You should plan to take it easy for the rest of today and you should NOT DRIVE or use heavy machinery until tomorrow (because of the sedation medicines used during the test).    FOLLOW UP: Our staff will call the number listed on your records the next business day following your procedure.  We will call around 7:15- 8:00 am to check on you and address any questions or concerns that you may have regarding the information given to you following your procedure. If we do not reach you, we will leave a message.  If you develop any symptoms (ie: fever, flu-like symptoms, shortness of breath, cough etc.) before then, please call [phone].  If you test positive for Covid 19 in the 2 weeks post procedure, please call and report this information to [place].    If any biopsies were taken you will be contacted by phone or by letter within the next 1-3 weeks.  Please call us at [phone] if you have not heard about the biopsies in 3 weeks.    SIGNATURES/CONFIDENTIALITY: You and/or your care partner have signed paperwork which will be entered into your electronic medical record.  These signatures attest to the fact that that the information above on your After Visit Summary has been reviewed and is understood.  Full responsibility of the confidentiality of this discharge information lies with you and/or your care-partner.

## 2021-09-02 NOTE — Op Note (Signed)
[facility] Patient Name: Kathy Barker Procedure Date: 09/02/2021 11:14 AM MRN: [id number] Endoscopist: Gatha Mayer , MD Age: 61 Referring MD:  Date of Birth: November 29, 1960 Gender: Female Account #: [id number] Procedure:                Colonoscopy Indications:              Screening for colorectal malignant neoplasm, Last                            colonoscopy: 2013 Medicines:                Monitored Anesthesia Care Procedure:                Pre-Anesthesia Assessment:                           - Prior to the procedure, a History and Physical                            was performed, and patient medications and                            allergies were reviewed. The patient's tolerance of                            previous anesthesia was also reviewed. The risks                            and benefits of the procedure and the sedation                            options and risks were discussed with the patient.                            All questions were answered, and informed consent                            was obtained. Prior Anticoagulants: The patient has                            taken no previous anticoagulant or antiplatelet                            agents. ASA Grade Assessment: II - A patient with                            mild systemic disease. After reviewing the risks                            and benefits, the patient was deemed in                            satisfactory condition to undergo the procedure.  After obtaining informed consent, the colonoscope                            was passed under direct vision. Throughout the                            procedure, the patient's blood pressure, pulse, and                            oxygen saturations were monitored continuously. The                            Olympus CF-HQ190L (Serial# [id number]) Colonoscope was                            introduced through the anus and advanced to  the the                            cecum, identified by appendiceal orifice and                            ileocecal valve. The colonoscopy was performed                            without difficulty. The patient tolerated the                            procedure well. The quality of the bowel                            preparation was good. The ileocecal valve,                            appendiceal orifice, and rectum were photographed.                            The bowel preparation used was Plenvu via split                            dose instruction. Scope In: 11:17:38 AM Scope Out: 11:28:14 AM Scope Withdrawal Time: 0 hours 8 minutes 34 seconds  Total Procedure Duration: 0 hours 10 minutes 36 seconds  Findings:                 The perianal and digital rectal examinations were                            normal.                           The entire examined colon appeared normal on direct                            and retroflexion views. Complications:            No immediate complications. Estimated  Blood Loss:     Estimated blood loss: none. Impression:               - The entire examined colon is normal on direct and                            retroflexion views.                           - No specimens collected. Recommendation:           - Patient has a contact number available for                            emergencies. The signs and symptoms of potential                            delayed complications were discussed with the                            patient. Return to normal activities tomorrow.                            Written discharge instructions were provided to the                            patient.                           - Resume previous diet.                           - Continue present medications.                           - Repeat colonoscopy in 10 years for screening                            purposes. Gatha Mayer, MD 09/02/2021 11:32:00 AM This  report has been signed electronically.

## 2021-09-03 ENCOUNTER — Other Ambulatory Visit (HOSPITAL_COMMUNITY): Payer: Self-pay

## 2021-09-03 ENCOUNTER — Encounter: Payer: Self-pay | Admitting: Oncology

## 2021-09-03 ENCOUNTER — Telehealth: Payer: Self-pay | Admitting: *Deleted

## 2021-09-03 MED ORDER — ROSUVASTATIN CALCIUM 5 MG PO TABS
ORAL_TABLET | ORAL | 2 refills | Status: DC
  Filled 2021-09-03: qty 30, 30d supply, fill #0
  Filled 2021-10-09: qty 30, 30d supply, fill #1
  Filled 2021-11-06: qty 30, 30d supply, fill #2

## 2021-09-03 NOTE — Telephone Encounter (Signed)
Follow up call , no answer, left message.

## 2021-09-12 ENCOUNTER — Other Ambulatory Visit: Payer: Self-pay

## 2021-09-12 ENCOUNTER — Encounter: Payer: Self-pay | Admitting: Hematology and Oncology

## 2021-09-12 ENCOUNTER — Inpatient Hospital Stay: Payer: Commercial Managed Care - HMO | Attending: Hematology and Oncology | Admitting: Hematology and Oncology

## 2021-09-12 ENCOUNTER — Telehealth: Payer: Self-pay | Admitting: Emergency Medicine

## 2021-09-12 VITALS — BP 137/83 | HR 94 | Temp 97.7°F | Resp 16 | Ht 62.0 in | Wt 138.4 lb

## 2021-09-12 DIAGNOSIS — Z170 Estrogen receptor positive status [ER+]: Secondary | ICD-10-CM | POA: Insufficient documentation

## 2021-09-12 DIAGNOSIS — Z803 Family history of malignant neoplasm of breast: Secondary | ICD-10-CM | POA: Diagnosis not present

## 2021-09-12 DIAGNOSIS — F419 Anxiety disorder, unspecified: Secondary | ICD-10-CM | POA: Insufficient documentation

## 2021-09-12 DIAGNOSIS — Z885 Allergy status to narcotic agent status: Secondary | ICD-10-CM | POA: Diagnosis not present

## 2021-09-12 DIAGNOSIS — Z8042 Family history of malignant neoplasm of prostate: Secondary | ICD-10-CM | POA: Insufficient documentation

## 2021-09-12 DIAGNOSIS — Z90721 Acquired absence of ovaries, unilateral: Secondary | ICD-10-CM | POA: Diagnosis not present

## 2021-09-12 DIAGNOSIS — Z811 Family history of alcohol abuse and dependence: Secondary | ICD-10-CM | POA: Insufficient documentation

## 2021-09-12 DIAGNOSIS — Z79899 Other long term (current) drug therapy: Secondary | ICD-10-CM | POA: Insufficient documentation

## 2021-09-12 DIAGNOSIS — M8580 Other specified disorders of bone density and structure, unspecified site: Secondary | ICD-10-CM | POA: Diagnosis not present

## 2021-09-12 DIAGNOSIS — M25512 Pain in left shoulder: Secondary | ICD-10-CM | POA: Insufficient documentation

## 2021-09-12 DIAGNOSIS — C50312 Malignant neoplasm of lower-inner quadrant of left female breast: Secondary | ICD-10-CM | POA: Diagnosis not present

## 2021-09-12 DIAGNOSIS — K219 Gastro-esophageal reflux disease without esophagitis: Secondary | ICD-10-CM | POA: Diagnosis not present

## 2021-09-12 DIAGNOSIS — Z8249 Family history of ischemic heart disease and other diseases of the circulatory system: Secondary | ICD-10-CM | POA: Diagnosis not present

## 2021-09-12 NOTE — Progress Notes (Signed)
[place]  Telephone:([phone]) [phone] Fax:(336) [phone]    ID: Kathy Barker DOB: 10/13/1960  MR#: [id number]  TWS#:[id number]  Patient Care Team: Kathy Neer, MD as PCP - General (Family Medicine) Kathy Kussmaul, MD as Consulting Physician (General Surgery) Kathy Gibson, MD as Attending Physician (Radiation Oncology) Kathy Monarch, MD as Consulting Physician (Dermatology) Kathy Pike, MD as Consulting Physician (Hematology and Oncology) Kathy Pike, MD OTHER MD:   CHIEF COMPLAINT: Left-sided breast cancer  CURRENT TREATMENT: observation   INTERVAL HISTORY: Kathy Barker returns today for follow up of her left-sided breast cancer. She is now under observation. 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 She has some left sided scapular pain since last visit. She follows up with Dr Kathy Barker for acid reflux. She had complete evaluation. She mentions that Dr Kathy Barker wondered if there is some autonomic neuropathy causing some of her symptoms. Last mammogram Jan 2023, pos treatment changes. Last bone density, Jan 2023, osteopenia, T score of -2.2  REVIEW OF SYSTEMS:   COVID 19 VACCINATION STATUS: She has had both immunizations, most recently April 2021   HISTORY OF CURRENT ILLNESS: From the original intake note:  Kathy Barker herself noted a palpable lower-inner left breast lump and immediately brought it to medical attention.. She underwent bilateral diagnostic mammography with tomography and left breast ultrasonography at [facility] on 05/30/2019 showing: breast density category B; 1.7 cm lobulated mass in left breast at 8 o'clock; no significant left axillary abnormalities.  Accordingly on 06/01/2019 she proceeded to biopsy of the left breast area in question. The pathology from this procedure ([id number]) showed: invasive ductal carcinoma, grade 3. Prognostic indicators significant for: estrogen receptor, 70% positive with weak staining intensity and progesterone  receptor, 0% negative. Proliferation marker Ki67 at 80%. HER2 negative by immunohistochemistry (1+).  The patient's subsequent history is as detailed below.   PAST MEDICAL HISTORY: Past Medical History:  Diagnosis Date   Allergic rhinitis    Anxiety    "only during my divorce" (11/18/2012)   Breast cancer ([place])    Depression    "only during my divorce" (11/18/2012)   GERD (gastroesophageal reflux disease)    Hiatal hernia    High cholesterol    Hot flashes    IBS (irritable bowel syndrome)    Insomnia    Migraines    "lately more often; before SVT I'd get them ~ q 6 months" (11/18/2012)   Osteopenia    PONV (postoperative nausea and vomiting)    Prediabetes    mild   RAD (reactive airway disease)    SVT (supraventricular tachycardia) ([place])    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 by Dr Rayann Heman    PAST SURGICAL HISTORY: Past Surgical History:  Procedure Laterality Date   BREAST LUMPECTOMY WITH RADIOACTIVE SEED AND SENTINEL LYMPH NODE BIOPSY Left 12/14/2019   Procedure: LEFT BREAST LUMPECTOMY X 2 WITH RADIOACTIVE SEED AND SENTINEL LYMPH NODE BIOPSY;  Surgeon: Kathy Kussmaul, MD;  Location: [place];  Service: General;  Laterality: Left;  PEC BLOCK   CESAREAN SECTION  01/13/1990   COLOSTOMY     COMBINED HYSTERECTOMY VAGINAL / OOPHORECTOMY / A&P REPAIR  01/13/1997   IR IMAGING GUIDED PORT INSERTION  06/30/2019   IR REMOVAL TUN ACCESS W/ PORT W/O FL MOD SED  05/24/2020   SUPRAVENTRICULAR TACHYCARDIA ABLATION  11/18/2012   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   SUPRAVENTRICULAR TACHYCARDIA ABLATION N/A 11/18/2012   Procedure: SUPRAVENTRICULAR TACHYCARDIA ABLATION;  Surgeon: Nelda Severe  Allred, MD;  Location: [place] CATH [facility];  Service: Cardiovascular;  Laterality: N/A;   UPPER GASTROINTESTINAL ENDOSCOPY     VAGINAL HYSTERECTOMY  ~ 2000    FAMILY HISTORY: Family History  Problem Relation Age of Onset   Breast cancer Mother    Heart attack Father     Alcohol abuse Father    Heart attack Paternal Grandfather    Heart Problems Other        both sides of family   Rectal cancer Neg Hx    Stomach cancer Neg Hx    Colon cancer Neg Hx    Esophageal cancer Neg Hx    Her father died at age 80 from alcohol abuse. Her mother is living at age 23 (as of 05/2019) and has a history of DCIS at age 6 and invasive lobular breast cancer at age 53. Kathy Barker has one brother. She reports cancer of an unknown type in her maternal grandmother and prostate cancer in a maternal uncle.   GYNECOLOGIC HISTORY:  No LMP recorded. Patient has had a hysterectomy. Menarche: 61 years old Age at first live birth: 61 years old [place] P 2 LMP 2000 Contraceptive: never used HRT never used  Hysterectomy? yes BSO? 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  She lost her job during her chemo treatments.  She is divorced. She lives at home with son Kathy Barker, age 39, who works in Biomedical scientist in [place] and is working towards a business degree. Son Kathy Barker, age 66, works in Sales executive here in [place] and just recently moved to [place] to start a cryptocurrency business.  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     ADVANCED DIRECTIVES: Not in place. She intends to name both of her sons as her 30.   HEALTH MAINTENANCE: Social History   Tobacco Use   Smoking status: Never   Smokeless tobacco: Never  Vaping Use   Vaping Use: Never used  Substance Use Topics   Alcohol use: Yes    Comment: 11/18/2012 "shot of brandy couple times/month"   Drug use: No     GI: Upper endoscopy 09/05/2020  PAP: date unsure  Bone density: never done   Allergies  Allergen Reactions   Fentanyl Nausea And Vomiting    Per patient " extreme nausea and vomiting "    Other Other (See Comments)    Problems with stitches   Simvastatin Other (See Comments)   Venlafaxine Other (See Comments)    Versed [Midazolam] Nausea And Vomiting    Per patient " extreme N/V"    Latex Rash    Current Outpatient Medications  Medication Sig Dispense Refill   HYDROcodone bit-homatropine (HYCODAN) 5-1.5 MG/5ML syrup Take 5 mls by mouth every 6 hours as needed (1 hour before meal) 120 mL 0   metoCLOPramide (REGLAN) 10 MG tablet Take 1 tablet by mouth 3 times daily and at bedtime as needed for nausea 90 tablet 2   omeprazole (PRILOSEC) 40 MG capsule Take 1 capsule by mouth in the morning 30 minutes before breakfast and 1 capsule 30 minutes prior to supper. 180 capsule 0   rosuvastatin (CRESTOR) 5 MG tablet Take 1 tablet by mouth once a day 30 tablet 2   rosuvastatin (CRESTOR) 5 MG tablet Take 1 tablet by mouth Once a day 30 tablet 2   sertraline (ZOLOFT) 100 MG tablet Take 100 mg by mouth daily.  temazepam (RESTORIL) 15 MG capsule Take 1-2 capsules by mouth at bedtime as needed 60 capsule 0   No current facility-administered medications for this visit.    OBJECTIVE: White woman in no acute distress Vitals:   09/12/21 0847  BP: 137/83  Pulse: 94  Resp: 16  Temp: 97.7 F (36.5 C)  SpO2: 98%     Body mass index is 25.31 kg/m.   Wt Readings from Last 3 Encounters:  09/12/21 138 lb 6.4 oz (62.8 kg)  09/02/21 134 lb (60.8 kg)  08/21/21 134 lb (60.8 kg)     ECOG FS:1 - Symptomatic but completely ambulatory  Physical Exam Constitutional:      Appearance: Normal appearance.  Cardiovascular:     Rate and Rhythm: Normal rate and regular rhythm.  Chest:     Comments: Bilateral breasts examined. No palpable masses or regional adenopathy Musculoskeletal:        General: No swelling or tenderness (No palpable tenderness).     Cervical back: Normal range of motion and neck supple. No rigidity.  Lymphadenopathy:     Cervical: No cervical adenopathy.  Neurological:     Mental Status: She is alert.       LAB RESULTS:  CMP     Component Value Date/Time   NA 139 02/21/2021 1213    K 4.1 02/21/2021 1213   CL 104 02/21/2021 1213   CO2 25 02/21/2021 1213   GLUCOSE 140 (H) 02/21/2021 1213   BUN 12 02/21/2021 1213   CREATININE 1.00 02/21/2021 1213   CALCIUM 9.7 02/21/2021 1213   PROT 7.1 02/21/2021 1213   ALBUMIN 4.6 02/21/2021 1213   AST 28 02/21/2021 1213   ALT 39 02/21/2021 1213   ALKPHOS 127 (H) 02/21/2021 1213   BILITOT 0.4 02/21/2021 1213   GFRNONAA >60 02/21/2021 1213   GFRAA >60 10/04/2019 1241    No results found for: "TOTALPROTELP", "ALBUMINELP", "A1GS", "A2GS", "BETS", "BETA2SER", "GAMS", "MSPIKE", "SPEI"  Lab Results  Component Value Date   WBC 6.3 02/21/2021   NEUTROABS 4.0 02/21/2021   HGB 13.8 02/21/2021   HCT 41.3 02/21/2021   MCV 90.2 02/21/2021   PLT 219 02/21/2021    No results found for: "LABCA2"  No components found for: "EEFEOF121"  No results for input(s): "INR" in the last 168 hours.  No results found for: "LABCA2"  No results found for: "FXJ883"  No results found for: "CAN125"  No results found for: "CAN153"  No results found for: "CA2729"  No components found for: "HGQUANT"  No results found for: "CEA1", "CEA" / No results found for: "CEA1", "CEA"   No results found for: "AFPTUMOR"  No results found for: "CHROMOGRNA"  No results found for: "KPAFRELGTCHN", "LAMBDASER", "KAPLAMBRATIO" (kappa/lambda light chains)  No results found for: "HGBA", "HGBA2QUANT", "HGBFQUANT", "HGBSQUAN" (Hemoglobinopathy evaluation)   No results found for: "LDH"  No results found for: "IRON", "TIBC", "IRONPCTSAT" (Iron and TIBC)  No results found for: "FERRITIN"  Urinalysis No results found for: "COLORURINE", "APPEARANCEUR", "LABSPEC", "PHURINE", "GLUCOSEU", "HGBUR", "BILIRUBINUR", "KETONESUR", "PROTEINUR", "UROBILINOGEN", "NITRITE", "LEUKOCYTESUR"   STUDIES: No results found.    ELIGIBLE FOR AVAILABLE RESEARCH PROTOCOL: no  ASSESSMENT: 61 y.o. [place] woman status post left breast lower inner quadrant biopsy  06/01/2019 for a clinical T1c N0, stage IB invasive ductal carcinoma, grade 3, weakly estrogen receptor positive but functionally triple negative, with an MIB-1 of 80%.  (1) Oncotype score of 51 predicts a risk of recurrence outside the breast in the next 9 years of greater  than 39% if the patient's only systemic therapy is antiestrogens for 5 years.  It also predicts a greater than 15% benefit from chemotherapy  (2) neoadjuvant chemotherapy consisting of doxorubicin and cyclophosphamide in dose dense fashion x4 started 06/30/2019, completed 08/23/2019, followed by weekly paclitaxel and carboplatin weekly x12 starting 09/13/2019, discontinued after 10/04/2019 dose  (a) carboplatin/paclitaxel discontinued after 3 doses with neuropathy developing  (b) repeat breast MRI 10/24/2019 shows resolution of the known malignancy  (3) Left lumpectomy on 12/14/2019 shows residual 0.3 cm invasive ductal carcinoma, margins negative, 3 sentinel lymph nodes negative  (a) repeat prognostic panel triple negative with an Mib-1 of 65%  (4) adjuvant radiation 01/24/2020 through 02/24/2020 Site Technique Total Dose (Gy) Dose per Fx (Gy) Completed Fx Beam Energies  Breast, Left: Breast_Lt 3D 40.05/40.05 2.67 15/15 6X, 10X  Breast, Left: Breast_Lt_Bst 3D 10/10 2 5/5 6X, 10X    PLAN:  She is almost 2 yrs out from diagnosis of breast cancer.  PE unremarkable, no concern for recurrence. Last mammogram in Jan 2023, post treatment changes. Next mammogram Jan 2024. With regards to left scapular pain, will do bone scan and confirm lack of disease. Bone density, continue Vit D, calcium rich foods and weight bearing exercise as tolerated. T score of -2.2 She will return to clinic in 6 months Continue FU with neurology, Dr Kathy Barker for other medical co-morbidities.  Total encounter time 30 minutes.*  . *Total Encounter Time as defined by the Centers for Medicare and Medicaid Services includes, in addition to the face-to-face  time of a patient visit (documented in the note above) non-face-to-face time: obtaining and reviewing outside history, ordering and reviewing medications, tests or procedures, care coordination (communications with other health care professionals or caregivers) and documentation in the medical record.

## 2021-09-12 NOTE — Telephone Encounter (Signed)
[facility]-[place] - TREATMENT OF ESTABLISHED CHEMOTHERAPY-INDUCED NEUROPATHY WITH N-PALMITOYLETHANOLAMIDE, A CANNABIMIMETIC NUTRACEUTICAL: A RANDOMIZED DOUBLE-BLIND PHASE II PILOT TRIAL  Patient was referred as potential [facility] [place] [facility]102 patient by Dr. Chryl Heck.  Attempted to contact patient, no answer.  Left VM requesting call back to discuss study if interested.  Wells Guiles 'Learta CoddingNeysa Bonito, RN, BSN Clinical Research Nurse I 09/12/21 4:17 PM

## 2021-09-17 ENCOUNTER — Telehealth: Payer: Self-pay | Admitting: Emergency Medicine

## 2021-09-17 NOTE — Telephone Encounter (Signed)
[id number] - TREATMENT OF ESTABLISHED CHEMOTHERAPY-INDUCED NEUROPATHY WITH N-PALMITOYLETHANOLAMIDE, A CANNABIMIMETIC NUTRACEUTICAL: A RANDOMIZED DOUBLE-BLIND PHASE II PILOT TRIAL   Patient was referred as potential [facility] [place] 2102 patient by Dr. Chryl Heck on 09/12/21.  Attempted to contact patient, no answer. Left VM requesting call back to discuss study if interested.  Wells Guiles 'Learta CoddingNeysa Bonito, RN, BSN Clinical Research Nurse I 09/17/21 2:22 PM

## 2021-09-18 ENCOUNTER — Other Ambulatory Visit (HOSPITAL_COMMUNITY): Payer: Self-pay

## 2021-09-18 MED ORDER — HYDROCODONE BIT-HOMATROP MBR 5-1.5 MG/5ML PO SOLN
ORAL | 0 refills | Status: DC
  Filled 2021-09-18: qty 120, 6d supply, fill #0

## 2021-09-20 ENCOUNTER — Telehealth: Payer: Self-pay | Admitting: Emergency Medicine

## 2021-09-20 NOTE — Telephone Encounter (Signed)
[facility]-[place] - TREATMENT OF ESTABLISHED CHEMOTHERAPY-INDUCED NEUROPATHY WITH N-PALMITOYLETHANOLAMIDE, A CANNABIMIMETIC NUTRACEUTICAL: A RANDOMIZED DOUBLE-BLIND PHASE II PILOT TRIAL  Attempted to contact patient for referral to discuss neuropathy study.  No answer, left VM requesting call back if interested.  Wells Guiles 'Learta CoddingNeysa Bonito, RN, BSN Clinical Research Nurse I 09/20/21 9:34 AM

## 2021-09-27 ENCOUNTER — Other Ambulatory Visit (HOSPITAL_COMMUNITY): Payer: Self-pay

## 2021-09-27 ENCOUNTER — Other Ambulatory Visit: Payer: Self-pay | Admitting: *Deleted

## 2021-09-27 ENCOUNTER — Telehealth: Payer: Self-pay | Admitting: Emergency Medicine

## 2021-09-27 NOTE — Telephone Encounter (Signed)
[facility] - TREATMENT OF ESTABLISHED CHEMOTHERAPY-INDUCED NEUROPATHY WITH N-PALMITOYLETHANOLAMIDE, A CANNABIMIMETIC NUTRACEUTICAL: A RANDOMIZED DOUBLE-BLIND PHASE II PILOT TRIAL  Patient contacted Research requesting more information about [facility] [place] [facility] neuropathy study.  Provided brief explanation and sent copies of consents to patient's provided email address for her review.  Will f/u with patient in the next few days.  Wells Guiles 'Learta CoddingNeysa Bonito, RN, BSN Clinical Research Nurse I 09/27/21 10:45 AM

## 2021-10-01 ENCOUNTER — Telehealth: Payer: Self-pay | Admitting: Emergency Medicine

## 2021-10-01 NOTE — Telephone Encounter (Signed)
[facility]-[place] - TREATMENT OF ESTABLISHED CHEMOTHERAPY-INDUCED NEUROPATHY WITH N-PALMITOYLETHANOLAMIDE, A CANNABIMIMETIC NUTRACEUTICAL: A RANDOMIZED DOUBLE-BLIND PHASE II PILOT TRIAL  Attempted to contact patient to f/u on trial interest.  No answer.  Left VM requesting call back.  Wells Guiles 'Learta CoddingNeysa Bonito, RN, BSN Clinical Research Nurse I 10/01/21 2:26 PM

## 2021-10-03 ENCOUNTER — Telehealth: Payer: Self-pay | Admitting: Emergency Medicine

## 2021-10-03 NOTE — Telephone Encounter (Signed)
[facility] - TREATMENT OF ESTABLISHED CHEMOTHERAPY-INDUCED NEUROPATHY WITH N-PALMITOYLETHANOLAMIDE, A CANNABIMIMETIC NUTRACEUTICAL: A RANDOMIZED DOUBLE-BLIND PHASE II PILOT TRIAL  Attempted to call pt to f/u on interest, no answer.  Left VM requesting call back.  Wells Guiles 'Learta CoddingNeysa Bonito, RN, BSN Clinical Research Nurse I 10/03/21 1:20 PM

## 2021-10-07 ENCOUNTER — Encounter (HOSPITAL_COMMUNITY): Payer: Commercial Managed Care - HMO

## 2021-10-08 ENCOUNTER — Other Ambulatory Visit (HOSPITAL_COMMUNITY): Payer: Self-pay

## 2021-10-09 ENCOUNTER — Other Ambulatory Visit (HOSPITAL_COMMUNITY): Payer: Self-pay

## 2021-10-10 ENCOUNTER — Other Ambulatory Visit (HOSPITAL_COMMUNITY): Payer: Self-pay

## 2021-10-10 MED ORDER — TEMAZEPAM 15 MG PO CAPS
15.0000 mg | ORAL_CAPSULE | Freq: Every evening | ORAL | 0 refills | Status: DC
  Filled 2021-10-10: qty 60, 30d supply, fill #0

## 2021-10-11 ENCOUNTER — Inpatient Hospital Stay: Payer: Commercial Managed Care - HMO | Attending: Hematology and Oncology | Admitting: Hematology and Oncology

## 2021-10-14 ENCOUNTER — Ambulatory Visit: Payer: Commercial Managed Care - HMO | Admitting: Neurology

## 2021-10-14 ENCOUNTER — Encounter: Payer: Self-pay | Admitting: Neurology

## 2021-10-14 ENCOUNTER — Other Ambulatory Visit (HOSPITAL_COMMUNITY): Payer: Self-pay

## 2021-10-14 VITALS — BP 148/89 | HR 91 | Ht 62.0 in | Wt 137.0 lb

## 2021-10-14 DIAGNOSIS — G620 Drug-induced polyneuropathy: Secondary | ICD-10-CM | POA: Diagnosis not present

## 2021-10-14 DIAGNOSIS — E538 Deficiency of other specified B group vitamins: Secondary | ICD-10-CM

## 2021-10-14 DIAGNOSIS — E531 Pyridoxine deficiency: Secondary | ICD-10-CM

## 2021-10-14 DIAGNOSIS — G629 Polyneuropathy, unspecified: Secondary | ICD-10-CM | POA: Diagnosis not present

## 2021-10-14 DIAGNOSIS — E519 Thiamine deficiency, unspecified: Secondary | ICD-10-CM

## 2021-10-14 MED ORDER — PREGABALIN 50 MG PO CAPS
50.0000 mg | ORAL_CAPSULE | Freq: Two times a day (BID) | ORAL | 6 refills | Status: DC
  Filled 2021-10-14: qty 60, 30d supply, fill #0
  Filled 2021-11-08 – 2021-11-11 (×2): qty 60, 30d supply, fill #1
  Filled 2021-12-10: qty 60, 30d supply, fill #2

## 2021-10-14 NOTE — Progress Notes (Addendum)
[facility]    Provider:  Dr Jaynee Eagles Requesting Provider: Inez Catalina, MD Primary Care Provider:  [name], MD  CC:  hand pain  HPI:  Kathy Barker is a 61 y.o. female here as requested by Inez Catalina, MD for hand pain after chemotherapy.  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  I reviewed Dr. 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  The neuropathy was so bad they stopped the chemotherapy. The pain started in the feet and in the hands after chemotherapy. Her feet do not like to be in shoes. The bottom of her last 3 toes feels like she has cotton inside. All started during chemotherapy. If she is on her feet fo rmore than a few hours she feels heat radiating up through her legs like it it rubbed raw. At night they will just burn. She doesn't know what to do with her hands. If she holds them out they feel the best. She can't feel a touch screen like she can't hit anything  right. She can't tun the key to get back into the house, she can;t open jars. She can't turn a knob or even write with her hand. She is getting ready to have a bone scan completed. The symptoms in her hands started in the tips of the fingers and radiates up to her elbows sometimes but radiatesin all the hand and on the top of the hand. 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 They closed her diability case and they need an explanation but that is clear. Both hands are equally impaired. The right hand may hurt more because she uses it more and her pinkies will spasm. All the fingers are involved th entire hand to the wrists. She was getting injections into her right thumb for de Quervain's tenosynovitis, pain in the wrists.No other focal neurologic deficits, associated symptoms, inciting events or modifiable factors.   Reviewed notes, labs and imaging from outside physicians, which showed: see above:  Xr cervical spine:  reviewed imaging agree CLINICAL DATA:  Neck pain with pain radiating in bilateral shoulders and arms since June. Pt has completed physical therapy but pain persist.   EXAM: CERVICAL SPINE - 2-3 VIEW   COMPARISON:  None.   FINDINGS: There is no evidence of cervical spine fracture or prevertebral soft tissue swelling. 2 mm retrolisthesis of C5 on C6. There is mild degenerative disc disease with disc height loss at C5-6. No other significant bone abnormalities are identified.   IMPRESSION: Mild degenerative disc disease with disc height loss at  C5-6.     Electronically Signed   By: Kathreen Devoid   On: 12/17/2016 15:14      Review of Systems: Patient complains of symptoms per HPI as well as the following symptoms numbness and tingling. Pertinent negatives and positives per HPI. All others negative.   Social History   Socioeconomic History   Marital status: Legally Separated    Spouse name: Not  on file   Number of children: Not on file   Years of education: Not on file   Highest education level: Not on file  Occupational History   Not on file  Tobacco Use   Smoking status: Never   Smokeless tobacco: Never  Vaping Use   Vaping Use: Never used  Substance and Sexual Activity   Alcohol use: Yes    Comment: 11/18/2012 "shot of brandy couple times/month"   Drug use: No   Sexual activity: Not Currently  Other Topics Concern   Not on file  Social History Narrative   She is divorced and has 2 adult sons.  1 son still lives with her.   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   Social Determinants of Health   Financial Resource Strain: Not on file  Food Insecurity: Not on file  Transportation Needs: Not on file  Physical Activity: Not on file  Stress: Not on file  Social Connections: Not on file  Intimate Partner Violence: Not on file    Family History  Problem Relation Age of Onset   Breast cancer Mother    Heart attack Father    Alcohol abuse Father    Heart attack Paternal Grandfather    Heart Problems Other        both sides of family   Rectal cancer Neg Hx    Stomach cancer Neg Hx    Colon cancer Neg Hx    Esophageal cancer Neg Hx    Neuropathy Neg Hx     Past Medical History:  Diagnosis Date   Allergic rhinitis    Anxiety    "only during my divorce" (11/18/2012)   Breast cancer ([place])    Depression    "only during my divorce" (11/18/2012)   GERD (gastroesophageal reflux disease)    Hiatal hernia    High cholesterol    Hot flashes    IBS (irritable bowel syndrome)    Insomnia    Migraines    "lately more often; before SVT I'd get them ~ q 6 months" (11/18/2012)   Osteopenia    PONV (postoperative nausea and vomiting)    Prediabetes    mild   RAD (reactive airway disease)    SVT (supraventricular tachycardia)    s/p AVNRT slow pathway  modification 11-18-2012 by Dr Rayann Heman    Patient Active Problem List   Diagnosis Date Noted   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) 10/20/2021   Port-A-Cath in place 06/30/2019   Malignant neoplasm of lower-inner quadrant of left breast in female, estrogen receptor positive ([place]) 06/06/2019   SVT (supraventricular tachycardia) 10/28/2012   Other specified cardiac dysrhythmias(427.89) 10/26/2012   Anxiety    Allergic rhinitis    Insomnia    Hot flashes    GERD (gastroesophageal reflux disease)    Depression    IBS (irritable bowel syndrome)     Past Surgical History:  Procedure Laterality Date   BREAST LUMPECTOMY WITH  RADIOACTIVE SEED AND SENTINEL LYMPH NODE BIOPSY Left 12/14/2019   Procedure: LEFT BREAST LUMPECTOMY X 2 WITH RADIOACTIVE SEED AND SENTINEL LYMPH NODE BIOPSY;  Surgeon: [name], MD;  Location: [facility];  Service: General;  Laterality: Left;  PEC BLOCK   CESAREAN SECTION  01/13/1990   COLOSTOMY     COMBINED HYSTERECTOMY VAGINAL / OOPHORECTOMY / A&P REPAIR  01/13/1997   IR IMAGING GUIDED PORT INSERTION  06/30/2019   IR REMOVAL TUN ACCESS W/ PORT W/O FL MOD SED  05/24/2020   SUPRAVENTRICULAR TACHYCARDIA ABLATION  11/18/2012   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   SUPRAVENTRICULAR TACHYCARDIA ABLATION N/A 11/18/2012   Procedure: SUPRAVENTRICULAR TACHYCARDIA ABLATION;  Surgeon: Coralyn Mark, MD;  Location: [facility] CATH LAB;  Service: Cardiovascular;  Laterality: N/A;   UPPER GASTROINTESTINAL ENDOSCOPY     VAGINAL HYSTERECTOMY  ~ 2000    Current Outpatient Medications  Medication Sig Dispense Refill   Multiple Vitamin (MULTIVITAMIN PO) Take by mouth 2 (two) times daily.     omeprazole (PRILOSEC) 40 MG capsule Take 1 capsule by mouth in the morning 30 minutes before breakfast and 1 capsule 30 minutes prior to supper. 180 capsule 0   pregabalin (LYRICA) 50 MG capsule Take 1 capsule (50 mg total) by mouth 2 (two) times daily.  60 capsule 6   rosuvastatin (CRESTOR) 5 MG tablet Take 1 tablet by mouth Once a day 30 tablet 2   sertraline (ZOLOFT) 100 MG tablet Take 100 mg by mouth daily.     temazepam (RESTORIL) 15 MG capsule Take 1-2 capsules (15-30 mg total) by mouth at bedtime as needed. 60 capsule 0   No current facility-administered medications for this visit.    Allergies as of 10/14/2021 - Review Complete 10/14/2021  Allergen Reaction Noted   Fentanyl Nausea And Vomiting 06/30/2019   Other Other (See Comments) 03/12/2020   Simvastatin Other (See Comments) 03/12/2020   Venlafaxine Other (See Comments) 03/12/2020   Versed [midazolam] Nausea And Vomiting 06/30/2019   Latex Rash     Vitals: BP (!) 148/89   Pulse 91   Ht $R'5\' 2"'LU$  (1.575 m)   Wt 137 lb (62.1 kg)   BMI 25.06 kg/m  Last Weight:  Wt Readings from Last 1 Encounters:  10/14/21 137 lb (62.1 kg)   Last Height:   Ht Readings from Last 1 Encounters:  10/14/21 $RemoveB'5\' 2"'FwdwJCaS$  (1.575 m)     Physical exam: Exam: Gen: NAD, conversant, well nourised, well groomed                     CV: RRR, no MRG. No Carotid Bruits. No peripheral edema, warm, nontender Eyes: Conjunctivae clear without exudates or hemorrhage  Neuro: Detailed Neurologic Exam  Speech:    Speech is normal; fluent and spontaneous with normal comprehension.  Cognition:    The patient is oriented to person, place, and time;     recent and remote memory intact;     language fluent;     normal attention, concentration,     fund of knowledge Cranial Nerves:    The pupils are equal, round, and reactive to light.  Pupils are too small, attempted funduscopic exam but could not visualize. Visual fields are full to finger confrontation. Extraocular movements are intact. Trigeminal sensation is intact and the muscles of mastication are normal. The face is symmetric. The palate elevates in the midline. Hearing intact. Voice is normal. Shoulder shrug is normal. The tongue  has normal motion without  fasciculations.   Coordination:    Decreased fine motor  Gait:    Cannot tandem has imbalance, difficulty with heel and toe walk  Motor Observation:    No asymmetry, and no involuntary movements noted. Thenar and hypotenar atrophy left > right Tone:    Normal muscle tone.    Posture:    Posture is normal. normal erect    Strength: weakness wrist flexion/extension, interossei, opponens, ulnar flexors, left > right, foot dorsiflexion and plantar flexion otherwise strength is intact in the upper and lower limbs.      Sensation: decreased in all nerve distributions to above the ankles and wrists, impaired proprioception and vibration distally in the fingres and toes.      Reflex Exam:  DTR's: absent AJs otherwise deep tendon reflexes in the upper and lower extremities are normal bilaterally.   Toes:    The toes are downgoing bilaterally.   Clonus:    Clonus is absent.    Assessment/Plan:  61 y.o. female here as requested by Inez Catalina, MD for hand pain after chemotherapy.  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  I reviewed Dr. 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  -Severe painful neuropathy in the hands and feet most likely from chemotherapy, started during chemotherapy, no other significant risk factors but will check some serum neuropathy labs and emg/ncs for other causes: significantly affecting her life, decreased fine motor movements, impairment of ADLs.decreased in all nerve distributions to above the ankles and wrists, impaired proprioception and vibration distally in the fingres and toes. Cannot tandem has imbalance, difficulty with heel and toe walk, decreased fine motor.   - start lyrica to see if we can help with pain.  Send to dr Domingo Cocking. Need to know whether this is peripheral polyneuropathy or a component of nerve entrapment such as CTS in the hands. Request bilateral uppers and one leg if possible.  Orders Placed This Encounter  Procedures   B12 and Folate Panel   Methylmalonic acid, serum   Vitamin B1   Vitamin B6   ANA Comprehensive Panel   Multiple Myeloma Panel (SPEP&IFE w/QIG)   B12 and Folate Panel   Methylmalonic acid, serum   Vitamin B1   Vitamin B6   ANA Comprehensive Panel   Multiple Myeloma Panel (SPEP&IFE w/QIG)   NCV with EMG(electromyography)   NCV with EMG(electromyography)   Meds ordered this encounter  Medications   pregabalin (LYRICA) 50 MG capsule    Sig: Take 1 capsule (50 mg total) by mouth 2 (two) times daily.    Dispense:  60 capsule  Refill:  6    Cc: Inez Catalina, MD,  [name], MD  Sarina Ill, MD  [facility] [address] [place], [place] [address]  Phone [phone] Fax [phone]

## 2021-10-14 NOTE — Patient Instructions (Addendum)
Bloodwork Emg/ncs  Electromyoneurogram Electromyoneurogram is a test to check how well your muscles and nerves are working. This procedure includes the combined use of electromyogram (EMG) and nerve conduction study (NCS). EMG is used to evaluate muscles and the nerves that control those muscles. NCS, which is also called electroneurogram, measures how well your nerves conduct electricity. The procedures should be done together to check if your muscles and nerves are healthy. If the results of the tests are abnormal, this may indicate disease or injury, such as a neuromuscular disease or peripheral nerve damage. Tell a health care provider about: Any allergies you have. All medicines you are taking, including vitamins, herbs, eye drops, creams, and over-the-counter medicines. Any bleeding problems you have. Any surgeries you have had. Any medical conditions you have. What are the risks? Generally, this is a safe procedure. However, problems may occur, including: Bleeding or bruising. Infection where the electrodes were inserted. What happens before the test? Medicines Take all of your usually prescribed medications before this testing is performed. Do not stop your blood thinners unless advised by your prescribing physician. General instructions Your health care provider may ask you to warm the limb that will be checked with warm water, hot pack, or wrapping the limb in a blanket. Do not use lotions or creams on the same day that you will be having the procedure. What happens during the test? For EMG  Your health care provider will ask you to stay in a position so that the muscle being studied can be accessed. You will be sitting or lying down. You may be given a medicine to numb the area (local anesthetic) and the skin will be disinfected. A very thin needle that has an electrode will be inserted into your muscle, one muscle at a time. Typically, multiple muscles are evaluated during a  single study. Another small electrode will be placed on your skin near the muscle. Your health care provider will ask you to continue to remain still. The electrodes will record the electrical activity of your muscles. You may see this on a monitor or hear it in the room. After your muscles have been studied at rest, your health care provider will ask you to contract or flex your muscles. The electrodes will record the electrical activity of your muscles. Your health care provider will remove the electrodes and the electrode needle when the procedure is finished. The procedure may vary among health care providers and hospitals. For NCS  An electrode that records your nerve activity (recording electrode) will be placed on your skin by the muscle that is being studied. An electrode that is used as a reference (reference electrode) will be placed near the recording electrode. A paste or gel will be applied to your skin between the recording electrode and the reference electrode. Your nerve will be stimulated with a mild shock. The speed of the nerves and strength of response is recorded by the electrodes. Your health care provider will remove the electrodes and the gel when the procedure is finished. The procedure may vary among health care providers and hospitals. What can I expect after the test? It is up to you to get your test results. Ask your health care provider, or the department that is doing the test, when your results will be ready. Your health care provider may: Give you medicines for any pain. Monitor the insertion sites to make sure that bleeding stops. You should be able to drive yourself to and from the  test. Discomfort can persist for a few hours after the test, but should be better the next day. Contact a health care provider if: You have swelling, redness, or drainage at any of the insertion sites. Summary Electromyoneurogram is a test to check how well your muscles and  nerves are working. If the results of the tests are abnormal, this may indicate disease or injury. This is a safe procedure. However, problems may occur, such as bleeding and infection. Your health care provider will do two tests to complete this procedure. One checks your muscles (EMG) and another checks your nerves (NCS). It is up to you to get your test results. Ask your health care provider, or the department that is doing the test, when your results will be ready. This information is not intended to replace advice given to you by your health care provider. Make sure you discuss any questions you have with your health care provider. Document Revised: 09/12/2020 Document Reviewed: 08/12/2020 Elsevier Patient Education  [place].  Peripheral Neuropathy Peripheral neuropathy is a type of nerve damage. It affects nerves that carry signals between the spinal cord and the arms, legs, and the rest of the body (peripheral nerves). It does not affect nerves in the spinal cord or brain. In peripheral neuropathy, one nerve or a group of nerves may be damaged. Peripheral neuropathy is a broad category that includes many specific nerve disorders, like diabetic neuropathy, hereditary neuropathy, and carpal tunnel syndrome. What are the causes? This condition may be caused by: Certain diseases, such as: Diabetes. This is the most common cause of peripheral neuropathy. Autoimmune diseases, such as rheumatoid arthritis and systemic lupus erythematosus. Nerve diseases that are passed from parent to child (inherited). Kidney disease. Thyroid disease. Other causes may include: Nerve injury. Pressure or stress on a nerve that lasts a long time. Lack (deficiency) of B vitamins. This can result from alcoholism, poor diet, or a restricted diet. Infections. Some medicines, such as cancer medicines (chemotherapy). Poisonous (toxic) substances, such as lead and mercury. Too little blood flowing to the  legs. In some cases, the cause of this condition is not known. What are the signs or symptoms? Symptoms of this condition depend on which of your nerves is damaged. Symptoms in the legs, hands, and arms can include: Loss of feeling (numbness) in the feet, hands, or both. Tingling in the feet, hands, or both. Burning pain. Very sensitive skin. Weakness. Not being able to move a part of the body (paralysis). Clumsiness or poor coordination. Muscle twitching. Loss of balance. Symptoms in other parts of the body can include: Not being able to control your bladder. Feeling dizzy. Sexual problems. How is this diagnosed? Diagnosing and finding the cause of peripheral neuropathy can be difficult. Your health care provider will take your medical history and do a physical exam. A neurological exam will also be done. This involves checking things that are affected by your brain, spinal cord, and nerves (nervous system). For example, your health care provider will check your reflexes, how you move, and what you can feel. You may have other tests, such as: Blood tests. Electromyogram (EMG) and nerve conduction tests. These tests check nerve function and how well the nerves are controlling the muscles. Imaging tests, such as a CT scan or MRI, to rule out other causes of your symptoms. Removing a small piece of nerve to be examined in a lab (nerve biopsy). Removing and examining a small amount of the fluid that surrounds the  brain and spinal cord (lumbar puncture). How is this treated? Treatment for this condition may involve: Treating the underlying cause of the neuropathy, such as diabetes, kidney disease, or vitamin deficiencies. Stopping medicines that can cause neuropathy, such as chemotherapy. Medicine to help relieve pain. Medicines may include: Prescription or over-the-counter pain medicine. Anti-seizure medicine. Antidepressants. Pain-relieving patches that are applied to painful areas of  skin. Surgery to relieve pressure on a nerve or to destroy a nerve that is causing pain. Physical therapy to help improve movement and balance. Devices to help you move around (assistive devices). Follow these instructions at home: Medicines Take over-the-counter and prescription medicines only as told by your health care provider. Do not take any other medicines without first asking your health care provider. Ask your health care provider if the medicine prescribed to you requires you to avoid driving or using machinery. Lifestyle  Do not use any products that contain nicotine or tobacco. These products include cigarettes, chewing tobacco, and vaping devices, such as e-cigarettes. Smoking keeps blood from reaching damaged nerves. If you need help quitting, ask your health care provider. Avoid or limit alcohol. Too much alcohol can cause a vitamin B deficiency, and vitamin B is needed for healthy nerves. Eat a healthy diet. This includes: Eating foods that are high in fiber, such as beans, whole grains, and fresh fruits and vegetables. Limiting foods that are high in fat and processed sugars, such as fried or sweet foods. General instructions  If you have diabetes, work closely with your health care provider to keep your blood sugar under control. If you have numbness in your feet: Check every day for signs of injury or infection. Watch for redness, warmth, and swelling. Wear padded socks and comfortable shoes. These help protect your feet. Develop a good support system. Living with peripheral neuropathy can be stressful. Consider talking with a mental health specialist or joining a support group. Use assistive devices and attend physical therapy as told by your health care provider. This may include using a walker or a cane. Keep all follow-up visits. This is important. Where to find more information [employer] of Neurological Disorders: [employer] Contact a health care  provider if: You have new signs or symptoms of peripheral neuropathy. You are struggling emotionally from dealing with peripheral neuropathy. Your pain is not well controlled. Get help right away if: You have an injury or infection that is not healing normally. You develop new weakness in an arm or leg. You have fallen or do so frequently. Summary Peripheral neuropathy is when the nerves in the arms or legs are damaged, resulting in numbness, weakness, or pain. There are many causes of peripheral neuropathy, including diabetes, pinched nerves, vitamin deficiencies, autoimmune disease, and hereditary conditions. Diagnosing and finding the cause of peripheral neuropathy can be difficult. Your health care provider will take your medical history, do a physical exam, and do tests, including blood tests and nerve function tests. Treatment involves treating the underlying cause of the neuropathy and taking medicines to help control pain. Physical therapy and assistive devices may also help. This information is not intended to replace advice given to you by your health care provider. Make sure you discuss any questions you have with your health care provider. Document Revised: 09/04/2020 Document Reviewed: 09/04/2020 Elsevier Patient Education  [place].

## 2021-10-15 ENCOUNTER — Telehealth: Payer: Self-pay | Admitting: Emergency Medicine

## 2021-10-15 NOTE — Telephone Encounter (Signed)
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  Left VM requesting call back.  Wells Guiles 'Learta CoddingNeysa Bonito, RN, BSN Clinical Research Nurse I 10/15/21 10:51 AM

## 2021-10-17 ENCOUNTER — Telehealth: Payer: Self-pay

## 2021-10-17 NOTE — Telephone Encounter (Signed)
Received long term disability ppw for patient. Reached out to patient regarding $50 form fee, she was unable to pay towards that. Asked that I discuss waiving the fee with Dr Jaynee Eagles and get back to her.

## 2021-10-20 DIAGNOSIS — G620 Drug-induced polyneuropathy: Secondary | ICD-10-CM | POA: Insufficient documentation

## 2021-10-22 ENCOUNTER — Telehealth: Payer: Self-pay

## 2021-10-22 NOTE — Telephone Encounter (Signed)
Pt called and LVM asking about r/s for [place] bone scan appt. Pt cancelled because she thought she may have COVID but she did not. Returned pt's call and LVM with instructions to call [phone] to schedule before phone appt with MD.

## 2021-10-28 ENCOUNTER — Telehealth: Payer: Self-pay | Admitting: Neurology

## 2021-10-28 NOTE — Addendum Note (Signed)
Addended by: Sarina Ill B on: 10/28/2021 12:13 PM   Modules accepted: Orders

## 2021-10-28 NOTE — Telephone Encounter (Signed)
NCS/EMG order faxed to Dr. Domingo Cocking at the [facility] per Dr. Cathren Laine request. Phone # [phone].

## 2021-10-29 NOTE — Telephone Encounter (Signed)
Faxed $50 invoice to [employer] for form fee to [phone] received confirmation

## 2021-10-29 NOTE — Telephone Encounter (Signed)
Received call from patient stated that form fee invoice for $50 can be faxed to [facility] and they will pay this for her. Will see if we still have this paperwork

## 2021-11-06 ENCOUNTER — Other Ambulatory Visit: Payer: Self-pay | Admitting: Internal Medicine

## 2021-11-06 ENCOUNTER — Other Ambulatory Visit (HOSPITAL_COMMUNITY): Payer: Self-pay

## 2021-11-06 ENCOUNTER — Encounter (HOSPITAL_COMMUNITY)
Admission: RE | Admit: 2021-11-06 | Discharge: 2021-11-06 | Disposition: A | Discharge status: Home / Self Care | Payer: Commercial Managed Care - HMO | Source: Ambulatory Visit | Attending: Hematology and Oncology | Admitting: Hematology and Oncology

## 2021-11-06 DIAGNOSIS — C50312 Malignant neoplasm of lower-inner quadrant of left female breast: Secondary | ICD-10-CM | POA: Insufficient documentation

## 2021-11-06 DIAGNOSIS — Z170 Estrogen receptor positive status [ER+]: Secondary | ICD-10-CM | POA: Insufficient documentation

## 2021-11-06 MED ORDER — OMEPRAZOLE 40 MG PO CPDR
40.0000 mg | DELAYED_RELEASE_CAPSULE | Freq: Two times a day (BID) | ORAL | 3 refills | Status: DC
  Filled 2021-11-06: qty 180, 90d supply, fill #0
  Filled 2022-02-09 – 2022-02-25 (×2): qty 180, 90d supply, fill #1
  Filled 2022-06-03: qty 180, 90d supply, fill #2
  Filled 2022-09-04 – 2022-09-05 (×2): qty 180, 90d supply, fill #3

## 2021-11-06 MED ORDER — TECHNETIUM TC 99M MEDRONATE IV KIT
20.0000 | PACK | Freq: Once | INTRAVENOUS | Status: AC | PRN
  Administered 2021-11-06: 20 via INTRAVENOUS

## 2021-11-08 ENCOUNTER — Other Ambulatory Visit (HOSPITAL_COMMUNITY): Payer: Self-pay

## 2021-11-08 MED ORDER — TEMAZEPAM 15 MG PO CAPS
15.0000 mg | ORAL_CAPSULE | Freq: Every evening | ORAL | 0 refills | Status: DC | PRN
  Filled 2021-11-08: qty 60, 30d supply, fill #0

## 2021-11-11 ENCOUNTER — Other Ambulatory Visit (HOSPITAL_COMMUNITY): Payer: Self-pay

## 2021-11-12 ENCOUNTER — Inpatient Hospital Stay: Payer: Commercial Managed Care - HMO | Attending: Hematology and Oncology | Admitting: Hematology and Oncology

## 2021-11-12 ENCOUNTER — Encounter: Payer: Self-pay | Admitting: Hematology and Oncology

## 2021-11-12 DIAGNOSIS — C50312 Malignant neoplasm of lower-inner quadrant of left female breast: Secondary | ICD-10-CM | POA: Diagnosis not present

## 2021-11-12 DIAGNOSIS — Z170 Estrogen receptor positive status [ER+]: Secondary | ICD-10-CM

## 2021-11-12 NOTE — Progress Notes (Signed)
[facility]  [phone] [phone]    ID: Kathy Barker DOB: [birth year]  MR#: [id number]  WEX#:[id number]  Patient Care Team: [name], MD as PCP - General (Family Medicine) [name], MD as Consulting Physician (General Surgery) [name], MD as Attending Physician (Radiation Oncology) Lavonna Monarch, MD (Inactive) as Consulting Physician (Dermatology) Benay Pike, MD as Consulting Physician (Hematology and Oncology) Benay Pike, MD OTHER MD:   CHIEF COMPLAINT: Left-sided breast cancer  CURRENT TREATMENT: observation   INTERVAL HISTORY: Julina returns today for follow up of telephone appt. She continues to complain of left scapula pain, no change. It hasn't gotten any worse. She had bone scan done recently, she had to postpone that appointment because of COVID-like symptoms.  She is here to review them.  She denies any other complaints since her last visit here.  Rest of the pertinent 10 point ROS reviewed and negative  REVIEW OF SYSTEMS:   COVID 19 VACCINATION STATUS: She has had both immunizations, most recently April 2021   HISTORY OF CURRENT ILLNESS: From the original intake note:  Kathy Barker herself noted a palpable lower-inner left breast lump and immediately brought it to medical attention.. She underwent bilateral diagnostic mammography with tomography and left breast ultrasonography at [facility] on 05/30/2019 showing: breast density category B; 1.7 cm lobulated mass in left breast at 8 o'clock; no significant left axillary abnormalities.  Accordingly on 06/01/2019 she proceeded to biopsy of the left breast area in question. The pathology from this procedure ([id number]) showed: invasive ductal carcinoma, grade 3. Prognostic indicators significant for: estrogen receptor, 70% positive with weak staining intensity and progesterone receptor, 0% negative. Proliferation marker Ki67 at 80%. HER2 negative by  immunohistochemistry (1+).  The patient's subsequent history is as detailed below.   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 by Dr Rayann Heman    PAST SURGICAL HISTORY: Past Surgical History:  Procedure Laterality Date   BREAST LUMPECTOMY WITH RADIOACTIVE SEED AND SENTINEL LYMPH NODE BIOPSY Left 12/14/2019   Procedure: LEFT BREAST LUMPECTOMY X 2 WITH RADIOACTIVE SEED AND SENTINEL LYMPH NODE BIOPSY;  Surgeon: [name], MD;  Location: [facility];  Service: General;  Laterality: Left;  PEC BLOCK   CESAREAN SECTION  01/13/1990   COLOSTOMY     COMBINED HYSTERECTOMY VAGINAL / OOPHORECTOMY / A&P REPAIR  01/13/1997   IR IMAGING GUIDED PORT INSERTION  06/30/2019   IR REMOVAL TUN ACCESS W/ PORT W/O FL MOD SED  05/24/2020   SUPRAVENTRICULAR TACHYCARDIA ABLATION  11/18/2012   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   SUPRAVENTRICULAR TACHYCARDIA ABLATION N/A 11/18/2012   Procedure: SUPRAVENTRICULAR TACHYCARDIA ABLATION;  Surgeon: Coralyn Mark, MD;  Location: [facility];  Service: Cardiovascular;  Laterality: N/A;   UPPER GASTROINTESTINAL ENDOSCOPY     VAGINAL HYSTERECTOMY  ~ 2000    FAMILY HISTORY: Family History  Problem Relation Age of Onset   Breast cancer Mother    Heart attack Father    Alcohol abuse Father    Heart attack Paternal Grandfather    Heart Problems  Other        both sides of family   Rectal cancer Neg Hx    Stomach cancer Neg Hx    Colon cancer Neg Hx    Esophageal cancer Neg Hx    Neuropathy Neg Hx    Her father died at age 70 from alcohol abuse. Her mother is living at age 15 (as of 05/2019) and has a history of DCIS at age 4 and invasive lobular breast cancer at age 77. Ritha has one brother. She reports cancer of an unknown type in her maternal grandmother and prostate cancer in a maternal uncle.   GYNECOLOGIC HISTORY:  No LMP recorded. Patient has had a hysterectomy. Menarche: 61 years old Age at first live birth: 61 years old [place] P 2 LMP 2000 Contraceptive: never used HRT never used  Hysterectomy? yes BSO? 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  She lost her job during her chemo treatments.  She is divorced. She lives at home with son [name], age 37, who works in Biomedical scientist in [place] and is working towards a business degree. Son [name], age 21, works in Sales executive here in [place] and just recently moved to [place] to start a cryptocurrency business.  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     ADVANCED DIRECTIVES: Not in place. She intends to name both of her sons as her 51.   HEALTH MAINTENANCE: Social History   Tobacco Use   Smoking status: Never   Smokeless tobacco: Never  Vaping Use   Vaping Use: Never used  Substance Use Topics   Alcohol use: Yes    Comment: 11/18/2012 "shot of brandy couple times/month"   Drug use: No     GI: Upper endoscopy 09/05/2020  PAP: date unsure  Bone density: never done   Allergies  Allergen Reactions   Fentanyl Nausea And Vomiting    Per patient " extreme nausea and vomiting "    Other Other (See Comments)    Problems with stitches   Simvastatin Other (See Comments)   Venlafaxine Other (See Comments)   Versed [Midazolam] Nausea And Vomiting    Per patient "  extreme N/V"    Latex Rash    Current Outpatient Medications  Medication Sig Dispense Refill   Multiple Vitamin (MULTIVITAMIN PO) Take by mouth 2 (two) times daily.     omeprazole (PRILOSEC) 40 MG capsule Take 1 capsule (40 mg total) by mouth 2 (two) times daily, one 30 minutes before breakfast and one 30 minutes before to supper. 180 capsule 3   pregabalin (LYRICA) 50 MG capsule Take 1 capsule (50 mg total) by mouth 2 (two) times daily. 60 capsule 6   rosuvastatin (CRESTOR) 5 MG tablet Take 1 tablet by mouth Once a day 30 tablet 2   sertraline (ZOLOFT) 100 MG tablet Take 100 mg by mouth daily.     temazepam (RESTORIL) 15 MG capsule Take 1-2 capsules (15-30 mg total) by mouth at bedtime as needed. 60 capsule 0   No current facility-administered medications for this visit.  OBJECTIVE: White woman in no acute distress There were no vitals filed for this visit.    There is no height or weight on file to calculate BMI.   Wt Readings from Last 3 Encounters:  10/14/21 137 lb (62.1 kg)  09/12/21 138 lb 6.4 oz (62.8 kg)  09/02/21 134 lb (60.8 kg)     ECOG FS:1 - Symptomatic but completely ambulatory  PE not done telephone visit.   LAB RESULTS:  CMP     Component Value Date/Time   NA 139 02/21/2021 1213   K 4.1 02/21/2021 1213   CL 104 02/21/2021 1213   CO2 25 02/21/2021 1213   GLUCOSE 140 (H) 02/21/2021 1213   BUN 12 02/21/2021 1213   CREATININE 1.00 02/21/2021 1213   CALCIUM 9.7 02/21/2021 1213   PROT 7.1 02/21/2021 1213   ALBUMIN 4.6 02/21/2021 1213   AST 28 02/21/2021 1213   ALT 39 02/21/2021 1213   ALKPHOS 127 (H) 02/21/2021 1213   BILITOT 0.4 02/21/2021 1213   GFRNONAA >60 02/21/2021 1213   GFRAA >60 10/04/2019 1241    No results found for: "TOTALPROTELP", "ALBUMINELP", "A1GS", "A2GS", "BETS", "BETA2SER", "GAMS", "MSPIKE", "SPEI"  Lab Results  Component Value Date   WBC 6.3 02/21/2021   NEUTROABS 4.0 02/21/2021   HGB 13.8 02/21/2021   HCT 41.3 02/21/2021    MCV 90.2 02/21/2021   PLT 219 02/21/2021    No results found for: "LABCA2"  No components found for: "JSHFWY637"  No results for input(s): "INR" in the last 168 hours.  No results found for: "LABCA2"  No results found for: "CHY850"  No results found for: "CAN125"  No results found for: "CAN153"  No results found for: "CA2729"  No components found for: "HGQUANT"  No results found for: "CEA1", "CEA" / No results found for: "CEA1", "CEA"   No results found for: "AFPTUMOR"  No results found for: "CHROMOGRNA"  No results found for: "KPAFRELGTCHN", "LAMBDASER", "KAPLAMBRATIO" (kappa/lambda light chains)  No results found for: "HGBA", "HGBA2QUANT", "HGBFQUANT", "HGBSQUAN" (Hemoglobinopathy evaluation)   No results found for: "LDH"  No results found for: "IRON", "TIBC", "IRONPCTSAT" (Iron and TIBC)  No results found for: "FERRITIN"  Urinalysis No results found for: "COLORURINE", "APPEARANCEUR", "LABSPEC", "PHURINE", "GLUCOSEU", "HGBUR", "BILIRUBINUR", "KETONESUR", "PROTEINUR", "UROBILINOGEN", "NITRITE", "LEUKOCYTESUR"   STUDIES: NM Bone Scan Whole Body  Result Date: 11/08/2021 CLINICAL DATA:  Left scapular pain.  History of breast cancer EXAM: NUCLEAR MEDICINE WHOLE BODY BONE SCAN TECHNIQUE: Whole body anterior and posterior images were obtained approximately 3 hours after intravenous injection of radiopharmaceutical. RADIOPHARMACEUTICALS:  19.5 mCi Technetium-34mMDP IV COMPARISON:  Scapular x-ray 02/21/2021.  CT chest 02/28/2015 FINDINGS: Physiologic distribution of radiotracer with bilateral renal uptake and excretion. There are a few scattered sites of presumed degenerative radiotracer uptake including the bilateral sternoclavicular joints, and within the bilateral feet. Otherwise, no focal site of abnormal radiotracer accumulation within the axial or appendicular skeleton. Specifically, no abnormal radiotracer uptake within the left scapula. IMPRESSION: No scintigraphic  evidence of osseous metastatic disease, with attention to the left scapula. Electronically Signed   By: NDavina PokeD.O.   On: 11/08/2021 12:40      ELIGIBLE FOR AVAILABLE RESEARCH PROTOCOL: no  ASSESSMENT: 61y.o. [place] woman status post left breast lower inner quadrant biopsy 06/01/2019 for a clinical T1c N0, stage IB invasive ductal carcinoma, grade 3, weakly estrogen receptor positive but functionally triple negative, with an MIB-1 of 80%.  (1) Oncotype score of 51 predicts a risk of recurrence  outside the breast in the next 9 years of greater than 39% if the patient's only systemic therapy is antiestrogens for 5 years.  It also predicts a greater than 15% benefit from chemotherapy  (2) neoadjuvant chemotherapy consisting of doxorubicin and cyclophosphamide in dose dense fashion x4 started 06/30/2019, completed 08/23/2019, followed by weekly paclitaxel and carboplatin weekly x12 starting 09/13/2019, discontinued after 10/04/2019 dose  (a) carboplatin/paclitaxel discontinued after 3 doses with neuropathy developing  (b) repeat breast MRI 10/24/2019 shows resolution of the known malignancy  (3) Left lumpectomy on 12/14/2019 shows residual 0.3 cm invasive ductal carcinoma, margins negative, 3 sentinel lymph nodes negative  (a) repeat prognostic panel triple negative with an Mib-1 of 65%  (4) adjuvant radiation 01/24/2020 through 02/24/2020 Site Technique Total Dose (Gy) Dose per Fx (Gy) Completed Fx Beam Energies  Breast, Left: Breast_Lt 3D 40.05/40.05 2.67 15/15 6X, 10X  Breast, Left: Breast_Lt_Bst 3D 10/10 2 5/5 6X, 10X    PLAN:  She is almost 2 yrs out from diagnosis of breast cancer.  Recently she complained of left scapular pain hence we have discussed about considering a bone scan if she had persistent pain.  She is here to review the bone scan results which did not show any evidence of metastatic disease.  We have once again discussed that she continue to monitor this symptom  and keep [place] posted if this becomes severe.  She expressed understanding.  We also discussed about continuing calcium and vitamin D supplementation, do some stretch exercises and weightbearing exercises for her osteopenia.  She will otherwise return to clinic for the planned appointment in February. Total encounter time 12 minutes.*  I connected with  Kathy Philips Barga on 11/12/21 by a telephone application and verified that I am speaking with the correct person using two identifiers.   I discussed the limitations of evaluation and management by telemedicine. The patient expressed understanding and agreed to proceed.  Total time spent: 12 min  *Total Encounter Time as defined by the Centers for Medicare and Medicaid Services includes, in addition to the face-to-face time of a patient visit (documented in the note above) non-face-to-face time: obtaining and reviewing outside history, ordering and reviewing medications, tests or procedures, care coordination (communications with other health care professionals or caregivers) and documentation in the medical record.

## 2021-11-13 DIAGNOSIS — Z0289 Encounter for other administrative examinations: Secondary | ICD-10-CM

## 2021-11-21 ENCOUNTER — Other Ambulatory Visit (HOSPITAL_COMMUNITY): Payer: Self-pay

## 2021-11-21 ENCOUNTER — Encounter: Payer: Self-pay | Admitting: Oncology

## 2021-11-21 MED ORDER — HYDROCODONE BIT-HOMATROP MBR 5-1.5 MG/5ML PO SOLN
5.0000 mL | Freq: Four times a day (QID) | ORAL | 0 refills | Status: DC | PRN
  Filled 2021-11-21 – 2021-12-10 (×2): qty 120, 6d supply, fill #0

## 2021-11-21 MED ORDER — NURTEC 75 MG PO TBDP
75.0000 mg | ORAL_TABLET | Freq: Every day | ORAL | 3 refills | Status: DC | PRN
  Filled 2021-11-21: qty 8, 8d supply, fill #0

## 2021-11-21 MED ORDER — SERTRALINE HCL 100 MG PO TABS
200.0000 mg | ORAL_TABLET | Freq: Every day | ORAL | 0 refills | Status: DC
  Filled 2021-11-21: qty 180, 90d supply, fill #0

## 2021-11-25 ENCOUNTER — Telehealth: Payer: Self-pay | Admitting: *Deleted

## 2021-11-25 NOTE — Telephone Encounter (Signed)
Received call from PAM at Dr. Kirstie Mirza office ([facility]).  She said that pt cancelled the Tuesday and Wednesday [place]/[facility] appt this week due to she could not afford the 40% that she would be responsible for.  It would be no cheaper next year due to deductible.  She wanted to let us know.

## 2021-12-06 ENCOUNTER — Other Ambulatory Visit (HOSPITAL_COMMUNITY): Payer: Self-pay

## 2021-12-10 ENCOUNTER — Other Ambulatory Visit (HOSPITAL_COMMUNITY): Payer: Self-pay

## 2021-12-11 ENCOUNTER — Other Ambulatory Visit (HOSPITAL_COMMUNITY): Payer: Self-pay

## 2021-12-12 ENCOUNTER — Telehealth: Payer: Self-pay | Admitting: Neurology

## 2021-12-12 ENCOUNTER — Other Ambulatory Visit (HOSPITAL_COMMUNITY): Payer: Self-pay

## 2021-12-12 MED ORDER — ROSUVASTATIN CALCIUM 5 MG PO TABS
5.0000 mg | ORAL_TABLET | Freq: Every day | ORAL | 1 refills | Status: DC
  Filled 2021-12-12: qty 90, 90d supply, fill #0
  Filled 2022-03-12: qty 90, 90d supply, fill #1

## 2021-12-12 MED ORDER — TEMAZEPAM 15 MG PO CAPS
15.0000 mg | ORAL_CAPSULE | Freq: Every evening | ORAL | 0 refills | Status: DC | PRN
  Filled 2021-12-12: qty 60, 30d supply, fill #0

## 2021-12-12 NOTE — Telephone Encounter (Signed)
[place] drug registry checked last fill 11-28/2023 #[phone]  Lyrica 50 po bid.  She did not have EMG due to OOP cost.  Asking for increased dose.

## 2021-12-12 NOTE — Telephone Encounter (Signed)
Pt is calling. Stated Dr. Jaynee Eagles put on medication  pregabalin (LYRICA) 50 MG capsule. Pt said she thinks medication needs to be increased. Pt is requesting a [employer].

## 2021-12-13 ENCOUNTER — Other Ambulatory Visit (HOSPITAL_COMMUNITY): Payer: Self-pay

## 2021-12-16 ENCOUNTER — Other Ambulatory Visit (HOSPITAL_COMMUNITY): Payer: Self-pay

## 2021-12-16 ENCOUNTER — Other Ambulatory Visit: Payer: Self-pay | Admitting: Neurology

## 2021-12-16 MED ORDER — PREGABALIN 100 MG PO CAPS
100.0000 mg | ORAL_CAPSULE | Freq: Two times a day (BID) | ORAL | 6 refills | Status: DC
  Filled 2021-12-16 – 2021-12-19 (×2): qty 60, 30d supply, fill #0
  Filled 2022-01-15 – 2022-01-28 (×2): qty 60, 30d supply, fill #1
  Filled 2022-02-25: qty 60, 30d supply, fill #2
  Filled 2022-04-05 – 2022-04-14 (×2): qty 60, 30d supply, fill #3
  Filled 2022-05-12: qty 60, 30d supply, fill #4
  Filled 2022-06-09: qty 60, 30d supply, fill #5
  Filled 2022-07-15: qty 60, 30d supply, fill #6

## 2021-12-16 NOTE — Telephone Encounter (Signed)
Doubling to '100mg'$  twice daily and we can increase further as long as she has no side effects, tjanks!

## 2021-12-17 ENCOUNTER — Other Ambulatory Visit (HOSPITAL_COMMUNITY): Payer: Self-pay

## 2021-12-19 ENCOUNTER — Other Ambulatory Visit (HOSPITAL_COMMUNITY): Payer: Self-pay

## 2022-01-15 ENCOUNTER — Other Ambulatory Visit (HOSPITAL_COMMUNITY): Payer: Self-pay

## 2022-01-16 ENCOUNTER — Other Ambulatory Visit: Payer: Self-pay

## 2022-01-17 ENCOUNTER — Other Ambulatory Visit (HOSPITAL_COMMUNITY): Payer: Self-pay

## 2022-01-17 MED ORDER — TEMAZEPAM 15 MG PO CAPS
15.0000 mg | ORAL_CAPSULE | Freq: Every evening | ORAL | 0 refills | Status: DC
  Filled 2022-01-17: qty 60, 30d supply, fill #0

## 2022-01-21 ENCOUNTER — Other Ambulatory Visit (HOSPITAL_COMMUNITY): Payer: Self-pay

## 2022-01-21 MED ORDER — HYDROCODONE BIT-HOMATROP MBR 5-1.5 MG/5ML PO SOLN
5.0000 mL | Freq: Four times a day (QID) | ORAL | 0 refills | Status: DC | PRN
  Filled 2022-01-21: qty 120, 6d supply, fill #0

## 2022-01-22 ENCOUNTER — Other Ambulatory Visit: Payer: Self-pay

## 2022-01-22 ENCOUNTER — Other Ambulatory Visit (HOSPITAL_COMMUNITY): Payer: Self-pay

## 2022-01-23 ENCOUNTER — Other Ambulatory Visit: Payer: Self-pay

## 2022-01-29 ENCOUNTER — Other Ambulatory Visit (HOSPITAL_COMMUNITY): Payer: Self-pay

## 2022-01-29 ENCOUNTER — Other Ambulatory Visit: Payer: Self-pay

## 2022-02-10 ENCOUNTER — Other Ambulatory Visit: Payer: Self-pay

## 2022-02-11 ENCOUNTER — Other Ambulatory Visit: Payer: Self-pay

## 2022-02-12 ENCOUNTER — Other Ambulatory Visit: Payer: Self-pay

## 2022-02-17 ENCOUNTER — Other Ambulatory Visit: Payer: Self-pay

## 2022-02-17 ENCOUNTER — Encounter: Payer: Self-pay | Admitting: Oncology

## 2022-02-17 ENCOUNTER — Other Ambulatory Visit (HOSPITAL_COMMUNITY): Payer: Self-pay

## 2022-02-18 ENCOUNTER — Telehealth: Payer: Self-pay | Admitting: Pharmacy Technician

## 2022-02-18 NOTE — Telephone Encounter (Signed)
Patient Advocate Encounter  Received notification from [facility] that prior authorization for OMEPRAZOLE '40MG'$  is required.   PA submitted on 2.6.24 Key [id number]  Status is pending

## 2022-02-19 ENCOUNTER — Other Ambulatory Visit: Payer: Self-pay

## 2022-02-20 ENCOUNTER — Other Ambulatory Visit: Payer: Self-pay

## 2022-02-20 ENCOUNTER — Encounter: Payer: Self-pay | Admitting: Oncology

## 2022-02-24 ENCOUNTER — Encounter: Payer: Self-pay | Admitting: Oncology

## 2022-02-25 ENCOUNTER — Other Ambulatory Visit: Payer: Self-pay

## 2022-02-25 ENCOUNTER — Other Ambulatory Visit (HOSPITAL_COMMUNITY): Payer: Self-pay

## 2022-02-26 ENCOUNTER — Other Ambulatory Visit: Payer: Self-pay

## 2022-02-26 ENCOUNTER — Other Ambulatory Visit (HOSPITAL_COMMUNITY): Payer: Self-pay

## 2022-02-26 MED ORDER — SERTRALINE HCL 100 MG PO TABS
200.0000 mg | ORAL_TABLET | Freq: Every day | ORAL | 1 refills | Status: DC
  Filled 2022-02-26 – 2022-06-09 (×2): qty 180, 90d supply, fill #0

## 2022-02-26 MED ORDER — SERTRALINE HCL 100 MG PO TABS
200.0000 mg | ORAL_TABLET | Freq: Every day | ORAL | 0 refills | Status: DC
  Filled 2022-02-26 – 2022-03-06 (×2): qty 180, 90d supply, fill #0

## 2022-02-26 MED ORDER — TEMAZEPAM 15 MG PO CAPS
15.0000 mg | ORAL_CAPSULE | Freq: Every evening | ORAL | 0 refills | Status: DC | PRN
  Filled 2022-02-26 – 2022-03-06 (×2): qty 60, 30d supply, fill #0

## 2022-02-27 ENCOUNTER — Other Ambulatory Visit (HOSPITAL_COMMUNITY): Payer: Self-pay

## 2022-02-27 ENCOUNTER — Other Ambulatory Visit: Payer: Self-pay

## 2022-02-28 ENCOUNTER — Other Ambulatory Visit (HOSPITAL_COMMUNITY): Payer: Self-pay

## 2022-03-04 ENCOUNTER — Other Ambulatory Visit (HOSPITAL_COMMUNITY): Payer: Self-pay

## 2022-03-04 ENCOUNTER — Encounter (HOSPITAL_COMMUNITY): Payer: Self-pay

## 2022-03-05 ENCOUNTER — Other Ambulatory Visit: Payer: Self-pay

## 2022-03-06 ENCOUNTER — Other Ambulatory Visit (HOSPITAL_COMMUNITY): Payer: Self-pay

## 2022-03-06 ENCOUNTER — Other Ambulatory Visit: Payer: Self-pay

## 2022-03-06 ENCOUNTER — Encounter: Payer: Self-pay | Admitting: Oncology

## 2022-03-13 ENCOUNTER — Other Ambulatory Visit (HOSPITAL_COMMUNITY): Payer: Self-pay

## 2022-03-13 ENCOUNTER — Encounter: Payer: Self-pay | Admitting: Hematology and Oncology

## 2022-03-13 ENCOUNTER — Inpatient Hospital Stay: Payer: Commercial Managed Care - HMO | Attending: Hematology and Oncology | Admitting: Hematology and Oncology

## 2022-03-13 VITALS — BP 136/95 | HR 87 | Temp 97.2°F | Resp 19 | Ht 62.0 in | Wt 139.2 lb

## 2022-03-13 DIAGNOSIS — Z803 Family history of malignant neoplasm of breast: Secondary | ICD-10-CM | POA: Diagnosis not present

## 2022-03-13 DIAGNOSIS — C7951 Secondary malignant neoplasm of bone: Secondary | ICD-10-CM | POA: Insufficient documentation

## 2022-03-13 DIAGNOSIS — Z8249 Family history of ischemic heart disease and other diseases of the circulatory system: Secondary | ICD-10-CM | POA: Diagnosis not present

## 2022-03-13 DIAGNOSIS — Z79899 Other long term (current) drug therapy: Secondary | ICD-10-CM | POA: Insufficient documentation

## 2022-03-13 DIAGNOSIS — C50312 Malignant neoplasm of lower-inner quadrant of left female breast: Secondary | ICD-10-CM | POA: Diagnosis present

## 2022-03-13 DIAGNOSIS — Z811 Family history of alcohol abuse and dependence: Secondary | ICD-10-CM | POA: Insufficient documentation

## 2022-03-13 DIAGNOSIS — G629 Polyneuropathy, unspecified: Secondary | ICD-10-CM | POA: Insufficient documentation

## 2022-03-13 DIAGNOSIS — Z90721 Acquired absence of ovaries, unilateral: Secondary | ICD-10-CM | POA: Insufficient documentation

## 2022-03-13 DIAGNOSIS — Z90710 Acquired absence of both cervix and uterus: Secondary | ICD-10-CM | POA: Insufficient documentation

## 2022-03-13 DIAGNOSIS — Z170 Estrogen receptor positive status [ER+]: Secondary | ICD-10-CM | POA: Diagnosis not present

## 2022-03-13 DIAGNOSIS — Z885 Allergy status to narcotic agent status: Secondary | ICD-10-CM | POA: Diagnosis not present

## 2022-03-13 DIAGNOSIS — K219 Gastro-esophageal reflux disease without esophagitis: Secondary | ICD-10-CM | POA: Insufficient documentation

## 2022-03-13 DIAGNOSIS — M25512 Pain in left shoulder: Secondary | ICD-10-CM | POA: Diagnosis not present

## 2022-03-13 NOTE — Progress Notes (Signed)
[facility]  [phone] [phone]    ID: Kathy Barker DOB: Aug 17, 1960  MR#: [id number]  [id number]  Patient Care Team: [name], MD as PCP - General (Family Medicine) [name], MD as Consulting Physician (General Surgery) [name], MD as Attending Physician (Radiation Oncology) Lavonna Monarch, MD (Inactive) as Consulting Physician (Dermatology) Benay Pike, MD as Consulting Physician (Hematology and Oncology) Benay Pike, MD OTHER MD:  CHIEF COMPLAINT: Left-sided breast cancer  CURRENT TREATMENT: observation  INTERVAL HISTORY:  Kathy Barker returns today for follow up. She continues to complain of left scapula pain, no change. It hasn't gotten any worse. Bone scan done in October with no scintigraphic evidence of osseous metastatic disease with attention to left scapula. She is now on lyrica for peripheral neuropathy. Lyrica has been helping some. She is due for mammogram. Rest of the pertinent 10 point ROS reviewed and negative  REVIEW OF SYSTEMS:   COVID 19 VACCINATION STATUS: She has had both immunizations, most recently April 2021   HISTORY OF CURRENT ILLNESS: From the original intake note:  Kathy Barker herself noted a palpable lower-inner left breast lump and immediately brought it to medical attention.. She underwent bilateral diagnostic mammography with tomography and left breast ultrasonography at [facility] on 05/30/2019 showing: breast density category B; 1.7 cm lobulated mass in left breast at 8 o'clock; no significant left axillary abnormalities.  Accordingly on 06/01/2019 she proceeded to biopsy of the left breast area in question. The pathology from this procedure [id number]) showed: invasive ductal carcinoma, grade 3. Prognostic indicators significant for: estrogen receptor, 70% positive with weak staining intensity and progesterone receptor, 0% negative. Proliferation marker Ki67 at 80%. HER2 negative by  immunohistochemistry (1+).  The patient's subsequent history is as detailed below.   PAST MEDICAL HISTORY: Past Medical History:  Diagnosis Date   Allergic rhinitis    Anxiety    "only during my divorce" (11/18/2012)   Breast cancer ([place])    Depression    "only during my divorce" (11/18/2012)   GERD (gastroesophageal reflux disease)    Hiatal hernia    High cholesterol    Hot flashes    IBS (irritable bowel syndrome)    Insomnia    Migraines    "lately more often; before SVT I'd get them ~ q 6 months" (11/18/2012)   Osteopenia    PONV (postoperative nausea and vomiting)    Prediabetes    mild   RAD (reactive airway disease)    SVT (supraventricular tachycardia)    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 by Dr Rayann Heman    PAST SURGICAL HISTORY: Past Surgical History:  Procedure Laterality Date   BREAST LUMPECTOMY WITH RADIOACTIVE SEED AND SENTINEL LYMPH NODE BIOPSY Left 12/14/2019   Procedure: LEFT BREAST LUMPECTOMY X 2 WITH RADIOACTIVE SEED AND SENTINEL LYMPH NODE BIOPSY;  Surgeon: [name], MD;  Location: [facility];  Service: General;  Laterality: Left;  PEC BLOCK   CESAREAN SECTION  01/13/1990   COLOSTOMY     COMBINED HYSTERECTOMY VAGINAL / OOPHORECTOMY / A&P REPAIR  01/13/1997   IR IMAGING GUIDED PORT INSERTION  06/30/2019   IR REMOVAL TUN ACCESS W/ PORT W/O FL MOD SED  05/24/2020   SUPRAVENTRICULAR TACHYCARDIA ABLATION  11/18/2012   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   SUPRAVENTRICULAR TACHYCARDIA ABLATION N/A 11/18/2012   Procedure: SUPRAVENTRICULAR TACHYCARDIA ABLATION;  Surgeon: Coralyn Mark, MD;  Location: [facility];  Service: Cardiovascular;  Laterality: N/A;  UPPER GASTROINTESTINAL ENDOSCOPY     VAGINAL HYSTERECTOMY  ~ 2000    FAMILY HISTORY: Family History  Problem Relation Age of Onset   Breast cancer Mother    Heart attack Father    Alcohol abuse Father    Heart attack Paternal Grandfather    Heart Problems  Other        both sides of family   Rectal cancer Neg Hx    Stomach cancer Neg Hx    Colon cancer Neg Hx    Esophageal cancer Neg Hx    Neuropathy Neg Hx    Her father died at age 3 from alcohol abuse. Her mother is living at age 70 (as of 05/2019) and has a history of DCIS at age 61 and invasive lobular breast cancer at age 7. Kathy Barker has one brother. She reports cancer of an unknown type in her maternal grandmother and prostate cancer in a maternal uncle.   GYNECOLOGIC HISTORY:  No LMP recorded. Patient has had a hysterectomy. Menarche: 62 years old Age at first live birth: 62 years old [place] P 2 LMP 2000 Contraceptive: never used HRT never used  Hysterectomy? yes BSO? 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  She lost her job during her chemo treatments.  She is divorced. She lives at home with son [name], age 5, who works in Biomedical scientist in [place] and is working towards a business degree. Son [name], age 38, works in Sales executive here in [place] and just recently moved to [place] to start a cryptocurrency business.  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     ADVANCED DIRECTIVES: Not in place. She intends to name both of her sons as her 30.   HEALTH MAINTENANCE: Social History   Tobacco Use   Smoking status: Never   Smokeless tobacco: Never  Vaping Use   Vaping Use: Never used  Substance Use Topics   Alcohol use: Yes    Comment: 11/18/2012 "shot of brandy couple times/month"   Drug use: No     GI: Upper endoscopy 09/05/2020  PAP: date unsure  Bone density: never done   Allergies  Allergen Reactions   Fentanyl Nausea And Vomiting    Per patient " extreme nausea and vomiting "    Other Other (See Comments)    Problems with stitches   Simvastatin Other (See Comments)   Venlafaxine Other (See Comments)   Versed [Midazolam] Nausea And Vomiting    Per patient "  extreme N/V"    Latex Rash    Current Outpatient Medications  Medication Sig Dispense Refill   HYDROcodone bit-homatropine (HYCODAN) 5-1.5 MG/5ML syrup Take 5 mLs by mouth every 6 (six) hours as needed. Take one hour before a meal. 120 mL 0   HYDROcodone bit-homatropine (HYCODAN) 5-1.5 MG/5ML syrup Take 5 mLs by mouth every 6 (six) hours as needed (1 hour before a meal). 120 mL 0   Multiple Vitamin (MULTIVITAMIN PO) Take by mouth 2 (two) times daily.     omeprazole (PRILOSEC) 40 MG capsule Take 1 capsule (40 mg total) by mouth 2 (two) times daily, 30 minutes before breakfast and supper. 180 capsule 3   pregabalin (LYRICA) 100 MG capsule Take 1 capsule (100 mg total) by mouth 2 (two) times daily. 60 capsule 6   Rimegepant Sulfate (NURTEC) 75 MG TBDP Place 75 mg under the tongue and allow to disolve daily as needed for  migraine 8 tablet 3   rosuvastatin (CRESTOR) 5 MG tablet Take 1 tablet (5 mg total) by mouth daily. 90 tablet 1   sertraline (ZOLOFT) 100 MG tablet Take 100 mg by mouth daily.     sertraline (ZOLOFT) 100 MG tablet Take 2 tablets (200 mg total) by mouth daily. 180 tablet 0   sertraline (ZOLOFT) 100 MG tablet Take 2 tablets (200 mg total) by mouth daily. 180 tablet 1   temazepam (RESTORIL) 15 MG capsule Take 1-2 capsules (15-30 mg total) by mouth at bedtime as needed. 60 capsule 0   temazepam (RESTORIL) 15 MG capsule Take 1-2 capsules (15-30 mg total) by mouth at bedtime as needed. 60 capsule 0   temazepam (RESTORIL) 15 MG capsule Take 1-2 capsules (15-30 mg total) by mouth at bedtime as needed. 60 capsule 0   No current facility-administered medications for this visit.    OBJECTIVE: White woman in no acute distress Vitals:   03/13/22 0903  BP: (!) 136/95  Pulse: 87  Resp: 19  Temp: (!) 97.2 F (36.2 C)  SpO2: 98%     Body mass index is 25.46 kg/m.   Wt Readings from Last 3 Encounters:  03/13/22 139 lb 3.2 oz (63.1 kg)  10/14/21 137 lb (62.1 kg)  09/12/21 138 lb 6.4 oz  (62.8 kg)     ECOG FS:1 - Symptomatic but completely ambulatory  PE not done telephone visit.   LAB RESULTS:  CMP     Component Value Date/Time   NA 139 02/21/2021 1213   K 4.1 02/21/2021 1213   CL 104 02/21/2021 1213   CO2 25 02/21/2021 1213   GLUCOSE 140 (H) 02/21/2021 1213   BUN 12 02/21/2021 1213   CREATININE 1.00 02/21/2021 1213   CALCIUM 9.7 02/21/2021 1213   PROT 7.1 02/21/2021 1213   ALBUMIN 4.6 02/21/2021 1213   AST 28 02/21/2021 1213   ALT 39 02/21/2021 1213   ALKPHOS 127 (H) 02/21/2021 1213   BILITOT 0.4 02/21/2021 1213   GFRNONAA >60 02/21/2021 1213   GFRAA >60 10/04/2019 1241    No results found for: "TOTALPROTELP", "ALBUMINELP", "A1GS", "A2GS", "BETS", "BETA2SER", "GAMS", "MSPIKE", "SPEI"  Lab Results  Component Value Date   WBC 6.3 02/21/2021   NEUTROABS 4.0 02/21/2021   HGB 13.8 02/21/2021   HCT 41.3 02/21/2021   MCV 90.2 02/21/2021   PLT 219 02/21/2021    No results found for: "LABCA2"  No components found for: "[id number]"  No results for input(s): "INR" in the last 168 hours.  No results found for: "LABCA2"  No results found for: "[id number]"  No results found for: "CAN125"  No results found for: "CAN153"  No results found for: "CA2729"  No components found for: "HGQUANT"  No results found for: "CEA1", "CEA" / No results found for: "CEA1", "CEA"   No results found for: "AFPTUMOR"  No results found for: "CHROMOGRNA"  No results found for: "KPAFRELGTCHN", "LAMBDASER", "KAPLAMBRATIO" (kappa/lambda light chains)  No results found for: "HGBA", "HGBA2QUANT", "HGBFQUANT", "HGBSQUAN" (Hemoglobinopathy evaluation)   No results found for: "LDH"  No results found for: "IRON", "TIBC", "IRONPCTSAT" (Iron and TIBC)  No results found for: "FERRITIN"  Urinalysis No results found for: "COLORURINE", "APPEARANCEUR", "LABSPEC", "PHURINE", "GLUCOSEU", "HGBUR", "BILIRUBINUR", "KETONESUR", "PROTEINUR", "UROBILINOGEN", "NITRITE",  "LEUKOCYTESUR"   STUDIES: No results found.    ELIGIBLE FOR AVAILABLE RESEARCH PROTOCOL: no  ASSESSMENT: 62 y.o. [place] woman status post left breast lower inner quadrant biopsy 06/01/2019 for a clinical T1c N0, stage IB invasive ductal  carcinoma, grade 3, weakly estrogen receptor positive but functionally triple negative, with an MIB-1 of 80%.  (1) Oncotype score of 51 predicts a risk of recurrence outside the breast in the next 9 years of greater than 39% if the patient's only systemic therapy is antiestrogens for 5 years.  It also predicts a greater than 15% benefit from chemotherapy  (2) neoadjuvant chemotherapy consisting of doxorubicin and cyclophosphamide in dose dense fashion x4 started 06/30/2019, completed 08/23/2019, followed by weekly paclitaxel and carboplatin weekly x12 starting 09/13/2019, discontinued after 10/04/2019 dose  (a) carboplatin/paclitaxel discontinued after 3 doses with neuropathy developing  (b) repeat breast MRI 10/24/2019 shows resolution of the known malignancy  (3) Left lumpectomy on 12/14/2019 shows residual 0.3 cm invasive ductal carcinoma, margins negative, 3 sentinel lymph nodes negative  (a) repeat prognostic panel triple negative with an Mib-1 of 65%  (4) adjuvant radiation 01/24/2020 through 02/24/2020 Site Technique Total Dose (Gy) Dose per Fx (Gy) Completed Fx Beam Energies  Breast, Left: Breast_Lt 3D 40.05/40.05 2.67 15/15 6X, 10X  Breast, Left: Breast_Lt_Bst 3D 10/10 2 5/5 6X, 10X    PLAN:  She is almost 3 yrs out from diagnosis of breast cancer.  No concerns on physical examination today except for postop and postradiation changes in the left breast. She is due for mammogram, we will fax it to [facility]. She will continue Lyrica for peripheral neuropathy and follow-up with neurology as recommended. At this point since she is almost 3 years out from the diagnosis of cancer, she is comfortable with doing annual follow-ups.  She was of course  strongly encouraged to contact us sooner with any questions and she expressed understanding. Osteopenia on bone density, once again encouraged regular weightbearing exercises, calcium and vitamin D supplementation as tolerated.  Total time: 30 min *Total Encounter Time as defined by the Centers for Medicare and Medicaid Services includes, in addition to the face-to-face time of a patient visit (documented in the note above) non-face-to-face time: obtaining and reviewing outside history, ordering and reviewing medications, tests or procedures, care coordination (communications with other health care professionals or caregivers) and documentation in the medical record.

## 2022-03-14 ENCOUNTER — Other Ambulatory Visit: Payer: Self-pay

## 2022-03-14 ENCOUNTER — Encounter (HOSPITAL_COMMUNITY): Payer: Self-pay

## 2022-03-14 ENCOUNTER — Telehealth: Payer: Self-pay | Admitting: Hematology and Oncology

## 2022-03-14 ENCOUNTER — Other Ambulatory Visit (HOSPITAL_COMMUNITY): Payer: Self-pay

## 2022-03-14 MED ORDER — HYDROCODONE BIT-HOMATROP MBR 5-1.5 MG/5ML PO SOLN
5.0000 mL | Freq: Four times a day (QID) | ORAL | 0 refills | Status: DC | PRN
  Filled 2022-03-14 – 2022-03-25 (×3): qty 120, 6d supply, fill #0

## 2022-03-14 NOTE — Telephone Encounter (Signed)
Sent reminder out to patient

## 2022-03-19 ENCOUNTER — Other Ambulatory Visit: Payer: Self-pay

## 2022-03-19 ENCOUNTER — Other Ambulatory Visit (HOSPITAL_COMMUNITY): Payer: Self-pay

## 2022-03-20 ENCOUNTER — Other Ambulatory Visit: Payer: Self-pay

## 2022-03-25 ENCOUNTER — Other Ambulatory Visit: Payer: Self-pay

## 2022-03-25 ENCOUNTER — Other Ambulatory Visit (HOSPITAL_COMMUNITY): Payer: Self-pay

## 2022-04-05 ENCOUNTER — Other Ambulatory Visit: Payer: Self-pay

## 2022-04-05 ENCOUNTER — Other Ambulatory Visit (HOSPITAL_COMMUNITY): Payer: Self-pay

## 2022-04-07 ENCOUNTER — Encounter (HOSPITAL_COMMUNITY): Payer: Self-pay

## 2022-04-07 ENCOUNTER — Other Ambulatory Visit (HOSPITAL_COMMUNITY): Payer: Self-pay

## 2022-04-07 ENCOUNTER — Other Ambulatory Visit: Payer: Self-pay

## 2022-04-09 ENCOUNTER — Other Ambulatory Visit: Payer: Self-pay

## 2022-04-09 ENCOUNTER — Other Ambulatory Visit (HOSPITAL_COMMUNITY): Payer: Self-pay

## 2022-04-09 MED ORDER — TEMAZEPAM 15 MG PO CAPS
15.0000 mg | ORAL_CAPSULE | Freq: Every evening | ORAL | 0 refills | Status: DC | PRN
  Filled 2022-04-09: qty 60, 30d supply, fill #0

## 2022-04-11 ENCOUNTER — Other Ambulatory Visit: Payer: Self-pay

## 2022-04-14 ENCOUNTER — Other Ambulatory Visit (HOSPITAL_COMMUNITY): Payer: Self-pay

## 2022-04-15 ENCOUNTER — Other Ambulatory Visit: Payer: Self-pay

## 2022-04-22 ENCOUNTER — Ambulatory Visit (INDEPENDENT_AMBULATORY_CARE_PROVIDER_SITE_OTHER): Payer: Commercial Managed Care - HMO | Admitting: Neurology

## 2022-04-22 ENCOUNTER — Encounter: Payer: Self-pay | Admitting: Neurology

## 2022-04-22 ENCOUNTER — Telehealth: Payer: Self-pay

## 2022-04-22 VITALS — BP 143/89 | HR 102 | Ht 62.0 in | Wt 140.0 lb

## 2022-04-22 DIAGNOSIS — T451X5A Adverse effect of antineoplastic and immunosuppressive drugs, initial encounter: Secondary | ICD-10-CM

## 2022-04-22 DIAGNOSIS — R7309 Other abnormal glucose: Secondary | ICD-10-CM | POA: Diagnosis not present

## 2022-04-22 DIAGNOSIS — R202 Paresthesia of skin: Secondary | ICD-10-CM

## 2022-04-22 DIAGNOSIS — E7800 Pure hypercholesterolemia, unspecified: Secondary | ICD-10-CM

## 2022-04-22 DIAGNOSIS — G620 Drug-induced polyneuropathy: Secondary | ICD-10-CM

## 2022-04-22 DIAGNOSIS — R200 Anesthesia of skin: Secondary | ICD-10-CM | POA: Diagnosis not present

## 2022-04-22 NOTE — Progress Notes (Signed)
[name]    Provider:  Dr Lucia Gaskins Requesting Provider: Lupita Raider, MD Primary Care Provider:  Lupita Raider, MD  CC:  permanent chemotherapy-induced severe neuropathy  04/22/2022: Still with neuropathic pain.  Care with update and plan.  We plan to test her with an EMG nerve conduction study as well as extensive panel of blood work to rule out other causes.  Given the symptoms, the time course this is likely permanent neuropathy secondary to chemotherapy in fact the neuropathy was so bad they stopped the chemotherapy.  The pain started in the feet and in the hands after chemotherapy, intense numbness, feels like she has cotton inside, continuous, decreased fine motor movement, imbalance, constant pain, we have put her on pain medication Lyrica, we are treating her pain, unfortunately after chemotherapy this is a very common condition and if it has not improved by now it is permanent.  Patient complains of symptoms per HPI as well as the following symptoms: chemo neuropathy . Pertinent negatives and positives per HPI. All others negative  HPI:  Kathy Barker is a 62 y.o. female here as requested by Lupita Raider, MD for hand pain after chemotherapy.  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  I reviewed Dr. 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  The neuropathy was so bad they stopped the chemotherapy. The pain started in the feet and in the hands after chemotherapy. Her feet do not like to be in shoes. The bottom of her last 3 toes feels like she has cotton inside. All started during chemotherapy. If she is on her feet fo rmore than a few hours she feels heat radiating up through her legs like it it rubbed raw. At night they will just burn. She doesn't know what to do with her hands. If she holds them out they feel the best. She can't feel a touch screen like she can't hit anything right. She can't tun the key to get back into the house, she can;t open jars. She can't turn a knob or even write with her hand. She is getting ready to have a bone scan completed. The symptoms in her hands started in the tips of the fingers and radiates up to her elbows sometimes but radiatesin all the hand and on the top of the hand. 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 They closed her diability case and they need an explanation but that is clear. Both hands are equally impaired. The right hand may hurt more because she uses it more and her pinkies  will spasm. All the fingers are involved th entire hand to the wrists. She was getting injections into her right thumb for de Quervain's tenosynovitis, pain in the wrists.No other focal neurologic deficits, associated symptoms, inciting events or modifiable factors.   Reviewed notes, labs and imaging from outside physicians, which showed: see above:  Xr cervical spine:   reviewed imaging agree CLINICAL DATA:  Neck pain with pain radiating in bilateral shoulders and arms since June. Pt has completed physical therapy but pain persist.   EXAM: CERVICAL SPINE - 2-3 VIEW   COMPARISON:  None.   FINDINGS: There is no evidence of cervical spine fracture or prevertebral soft tissue swelling. 2 mm retrolisthesis of C5 on C6. There is mild degenerative disc disease with disc height loss at C5-6. No other significant bone abnormalities are identified.   IMPRESSION: Mild degenerative disc disease with disc height loss at C5-6.     Electronically Signed   By: Elige Ko   On: 12/17/2016 15:14      Review of Systems: Patient complains of symptoms per HPI as well as the following symptoms numbness and tingling. Pertinent negatives and positives per HPI. All others negative.   Social History   Socioeconomic History   Marital status: Legally Separated    Spouse name: Not on file   Number of children: Not on file   Years of education: Not on file   Highest education level: Not on file  Occupational History   Not on file  Tobacco Use   Smoking status: Never   Smokeless tobacco: Never  Vaping Use   Vaping Use: Never used  Substance and Sexual Activity   Alcohol use: Yes    Comment: 11/18/2012 "shot of brandy couple times/month"   Drug use: No   Sexual activity: Not Currently  Other Topics Concern   Not on file  Social History Narrative   She is divorced and has 2 adult sons.  1 son still lives with her.   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   Social Determinants of Health   Financial Resource Strain: Not on file  Food Insecurity: Not on file  Transportation Needs: Not on file  Physical Activity: Not on file  Stress: Not on file  Social Connections: Not on file  Intimate Partner Violence: Not on file    Family History   Problem Relation Age of Onset   Breast cancer Mother    Neuropathy Mother    Heart attack Father    Alcohol abuse Father    Heart attack Paternal Grandfather    Heart Problems Other        both sides of family   Rectal cancer Neg Hx    Stomach cancer Neg Hx    Colon cancer Neg Hx    Esophageal cancer Neg Hx     Past Medical History:  Diagnosis Date   Allergic rhinitis    Anxiety    "only during my divorce" (11/18/2012)   Breast cancer    Depression    "only during my divorce" (11/18/2012)   GERD (gastroesophageal reflux disease)    Hiatal hernia    High cholesterol    Hot flashes    IBS (irritable bowel syndrome)    Insomnia    Migraines    "lately more often; before SVT I'd get them ~ q 6 months" (11/18/2012)  Osteopenia    PONV (postoperative nausea and vomiting)    Prediabetes    mild   RAD (reactive airway disease)    SVT (supraventricular tachycardia)    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 by Dr Johney Frame    Patient Active Problem List   Diagnosis Date Noted   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 10/20/2021   Port-A-Cath in place 06/30/2019   Malignant neoplasm of lower-inner quadrant of left breast in female, estrogen receptor positive 06/06/2019   SVT (supraventricular tachycardia) 10/28/2012   Other specified cardiac dysrhythmias(427.89) 10/26/2012   Anxiety    Allergic rhinitis    Insomnia    Hot flashes    GERD (gastroesophageal reflux disease)    Depression    IBS (irritable bowel syndrome)     Past Surgical History:  Procedure Laterality Date   BREAST LUMPECTOMY WITH RADIOACTIVE SEED AND SENTINEL LYMPH NODE BIOPSY Left 12/14/2019   Procedure: LEFT BREAST LUMPECTOMY X 2 WITH RADIOACTIVE SEED AND SENTINEL LYMPH NODE BIOPSY;  Surgeon: Griselda Miner, MD;  Location: [facility];  Service: General;  Laterality: Left;  PEC BLOCK   CESAREAN SECTION  01/13/1990   COLOSTOMY     COMBINED HYSTERECTOMY VAGINAL /  OOPHORECTOMY / A&P REPAIR  01/13/1997   IR IMAGING GUIDED PORT INSERTION  06/30/2019   IR REMOVAL TUN ACCESS W/ PORT W/O FL MOD SED  05/24/2020   SUPRAVENTRICULAR TACHYCARDIA ABLATION  11/18/2012   slow pathway modification of AVNRT by Dr Johney Frame   SUPRAVENTRICULAR TACHYCARDIA ABLATION N/A 11/18/2012   Procedure: SUPRAVENTRICULAR TACHYCARDIA ABLATION;  Surgeon: Gardiner Rhyme, MD;  Location: [facility] CATH LAB;  Service: Cardiovascular;  Laterality: N/A;   UPPER GASTROINTESTINAL ENDOSCOPY     VAGINAL HYSTERECTOMY  ~ 2000    Current Outpatient Medications  Medication Sig Dispense Refill   HYDROcodone bit-homatropine (HYCODAN) 5-1.5 MG/5ML syrup Take 5 mLs by mouth every 6 (six) hours as needed. 120 mL 0   Multiple Vitamin (MULTIVITAMIN PO) Take by mouth 2 (two) times daily.     omeprazole (PRILOSEC) 40 MG capsule Take 1 capsule (40 mg total) by mouth 2 (two) times daily, 30 minutes before breakfast and supper. 180 capsule 3   pregabalin (LYRICA) 100 MG capsule Take 1 capsule (100 mg total) by mouth 2 (two) times daily. 60 capsule 6   rosuvastatin (CRESTOR) 5 MG tablet Take 1 tablet (5 mg total) by mouth daily. 90 tablet 1   sertraline (ZOLOFT) 100 MG tablet Take 2 tablets (200 mg total) by mouth daily. 180 tablet 1   temazepam (RESTORIL) 15 MG capsule Take 1-2 capsules (15-30 mg total) by mouth at bedtime as needed. 60 capsule 0   No current facility-administered medications for this visit.    Allergies as of 04/22/2022 - Review Complete 04/22/2022  Allergen Reaction Noted   Fentanyl Nausea And Vomiting 06/30/2019   Other Other (See Comments) 03/12/2020   Simvastatin Other (See Comments) 03/12/2020   Venlafaxine Other (See Comments) 03/12/2020   Versed [midazolam] Nausea And Vomiting 06/30/2019   Latex Rash     Vitals: BP (!) 143/89   Pulse (!) 102   Ht  (1.575 m)   Wt 140 lb (63.5 kg)   BMI 25.61 kg/m  Last Weight:  Wt Readings from Last 1 Encounters:  04/22/22 140 lb (63.5  kg)   Last Height:   Ht Readings from Last 1 Encounters:  04/22/22  (1.575 m)     Physical exam: Exam: Gen: NAD, conversant,  well nourised, well groomed                     CV: RRR, no MRG. No Carotid Bruits. No peripheral edema, warm, nontender Eyes: Conjunctivae clear without exudates or hemorrhage  Neuro: Detailed Neurologic Exam  Speech:    Speech is normal; fluent and spontaneous with normal comprehension.  Cognition:    The patient is oriented to person, place, and time;     recent and remote memory intact;     language fluent;     normal attention, concentration,     fund of knowledge Cranial Nerves:    The pupils are equal, round, and reactive to light.  Pupils are too small, attempted funduscopic exam but could not visualize. Visual fields are full to finger confrontation. Extraocular movements are intact. Trigeminal sensation is intact and the muscles of mastication are normal. The face is symmetric. The palate elevates in the midline. Hearing intact. Voice is normal. Shoulder shrug is normal. The tongue has normal motion without fasciculations.   Coordination:    Decreased fine motor  Gait:    Cannot tandem has imbalance, difficulty with heel and toe walk  Motor Observation:    No asymmetry, and no involuntary movements noted. Thenar and hypotenar atrophy left > right Tone:    Normal muscle tone.    Posture:    Posture is normal. normal erect    Strength: weakness wrist flexion/extension, interossei, opponens, ulnar flexors, left > right, foot dorsiflexion and plantar flexion otherwise strength is intact in the upper and lower limbs.      Sensation: decreased in all nerve distributions to above the ankles and wrists, impaired proprioception and vibration distally in the fingres and toes.      Reflex Exam:  DTR's: absent AJs otherwise deep tendon reflexes in the upper and lower extremities are normal bilaterally.   Toes:    The toes are downgoing  bilaterally.   Clonus:    Clonus is absent.    Assessment/Plan:  62 y.o. female here as requested by Christena Deem, MD for hand pain after chemotherapy.  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  I reviewed Dr. Margaretmary Eddy notes from August of this year, patient has a history of left breast cancer, now under observation, she has been struggling for neuropathy for years since her chemotherapy, she noted a palpable lower inner left breast lump it immediately brought her to medical attention, she went underwent diagnostic mammography with tomography and left breast ultrasound in May 2021 showing a lobulated mass in the left breast without significant left axillary no abnormalities, she had a biopsy, invasive ductal carcinoma grade 3, estrogen receptor 70% positive, she had chemotherapy consisting of doxorubicin and cyclophosphamide x4 started in July 2021 completed October 2021 followed by weekly paclitaxel and carboplatin weekly for 12 weeks starting September 13, 2019 discontinued after 10/03/2019 dose with neuropathy developing, adjuvant radiation January 2020 through February 2022.  -04/22/2022: Still with neuropathic pain.  Care with update and plan.  We plan to test her with an EMG nerve conduction study as well as extensive panel of blood work to rule out other causes.  Given the symptoms, the time course this is likely permanent neuropathy secondary to chemotherapy in fact the neuropathy was so bad they stopped the chemotherapy.  The pain started in the feet  and in the hands after chemotherapy, intense numbness, feels like she has cotton inside, continuous, decreased fine motor movement, imbalance, constant pain, we have put her on pain medication Lyrica, we are treating her pain, unfortunately after chemotherapy this is a very common  condition and if it has not improved by now it is permanent. -  significantly affecting her life, decreased fine motor movements, impairment of ADLs.decreased in all nerve distributions to above the ankles and wrists, impaired proprioception and vibration distally in the fingres and toes. Cannot tandem has imbalance, difficulty with heel and toe walk, decreased fine motor.  Again this is chronic and most likely will get better. Lyrica not helping, can discuss treatment after workup. NOT getting better. Exam similar, not improved.  - continue lyrica to see if we can help with pain. - likely permanent chemotherapy associated neuropathy but will rule out everything else - left hand and left leg (right hand if we can fit it) emg/ncs  Orders Placed This Encounter  Procedures   Hemoglobin A1c   Comprehensive metabolic panel   Lipid Panel   B12 and Folate Panel   Methylmalonic acid, serum   Vitamin B1   Vitamin B6   ANA Comprehensive Panel   Multiple Myeloma Panel (SPEP&IFE w/QIG)   TSH Rfx on Abnormal to Free T4   Angiotensin converting enzyme   Sedimentation rate   Sjogren's syndrome antibods(ssa + ssb)   Rheumatoid factor   Heavy metals, blood   Hepatitis C antibody   Vitamin E   RPR   Anti-Hu, Anti-Ri, Anti-Yo IFA   NCV with EMG(electromyography)    Cc: Lupita RaiderShaw, Kimberlee, MD,  Lupita RaiderShaw, Kimberlee, MD  Naomie DeanAntonia Alexas Basulto, MD  [facility] [address] [place], [place] [id number]  Phone [phone] Fax [phone]  I spent 40 minutes of face-to-face and non-face-to-face time with patient on the  1. Chemotherapy-induced neuropathy   2. Elevated glucose   3. Hypercholesteremia   4. Hand numbness   5. Numbness and tingling of foot    diagnosis.  This included previsit chart review, lab review, study review, order entry, electronic health record documentation, patient education on the different diagnostic and therapeutic options, counseling and  coordination of care, risks and benefits of management, compliance, or risk factor reduction

## 2022-04-22 NOTE — Telephone Encounter (Signed)
Notified Patient by voicemail of completion of Disability Forms. Fax transmission confirmation received. Copy of forms mailed to Patient.

## 2022-04-22 NOTE — Progress Notes (Signed)
This ofv note copy was emailed to [email or web address] Claim # [id number]. 0930 04-22-2022 per Dr. Lucia Gaskins request for pt.

## 2022-04-23 LAB — B12 AND FOLATE PANEL: Folate: 17.4 ng/mL (ref >3.0)

## 2022-04-23 LAB — ANTI-HU, ANTI-RI, ANTI-YO IFA
Anti-Hu Ab: NEGATIVE
Anti-Ri Ab: NEGATIVE
Anti-Yo Ab: NEGATIVE

## 2022-04-23 LAB — MULTIPLE MYELOMA PANEL, SERUM

## 2022-04-23 LAB — TSH RFX ON ABNORMAL TO FREE T4: TSH: 3.25 u[IU]/mL (ref 0.450–4.500)

## 2022-04-23 LAB — ANA COMPREHENSIVE PANEL: Centromere Ab Screen: <0.2 AI (ref 0.0–0.9)

## 2022-04-23 LAB — LIPID PANEL: VLDL Cholesterol Cal: 41 mg/dL — ABNORMAL HIGH (ref 5–40)

## 2022-04-23 LAB — HEPATITIS C ANTIBODY: Hep C Virus Ab: NONREACTIVE

## 2022-04-23 LAB — HEAVY METALS, BLOOD

## 2022-04-23 LAB — VITAMIN B1

## 2022-04-24 LAB — ANA COMPREHENSIVE PANEL: dsDNA Ab: <1 IU/mL (ref 0–9)

## 2022-04-24 LAB — LIPID PANEL
Cholesterol, Total: 183 mg/dL (ref 100–199)
HDL: 45 mg/dL (ref >39)
LDL Chol Calc (NIH): 97 mg/dL (ref 0–99)

## 2022-04-24 LAB — HEAVY METALS, BLOOD
Lead, Blood: <1 ug/dL (ref 0.0–3.4)
Mercury: <1 ug/L (ref 0.0–14.9)

## 2022-04-24 LAB — MULTIPLE MYELOMA PANEL, SERUM

## 2022-04-24 LAB — RPR: RPR Ser Ql: NONREACTIVE

## 2022-04-24 LAB — VITAMIN B6

## 2022-04-25 LAB — MULTIPLE MYELOMA PANEL, SERUM: Albumin SerPl Elph-Mcnc: 4.5 g/dL — ABNORMAL HIGH (ref 2.9–4.4)

## 2022-04-25 LAB — ANA COMPREHENSIVE PANEL
ENA SM Ab Ser-aCnc: <0.2 AI (ref 0.0–0.9)
ENA SSA (RO) Ab: <0.2 AI (ref 0.0–0.9)
Scleroderma (Scl-70) (ENA) Antibody, IgG: <0.2 AI (ref 0.0–0.9)

## 2022-04-25 LAB — LIPID PANEL: Triglycerides: 242 mg/dL — ABNORMAL HIGH (ref 0–149)

## 2022-04-27 LAB — ANA COMPREHENSIVE PANEL: ENA RNP Ab: <0.2 AI (ref 0.0–0.9)

## 2022-04-27 LAB — MULTIPLE MYELOMA PANEL, SERUM: Alpha 1: 0.3 g/dL (ref 0.0–0.4)

## 2022-04-27 LAB — B12 AND FOLATE PANEL: Vitamin B-12: 620 pg/mL (ref 232–1245)

## 2022-04-27 LAB — VITAMIN E: Vitamin E(Gamma Tocopherol): 1.9 mg/L (ref 0.5–4.9)

## 2022-04-28 ENCOUNTER — Telehealth: Payer: Self-pay | Admitting: *Deleted

## 2022-04-28 LAB — MULTIPLE MYELOMA PANEL, SERUM
B-Globulin SerPl Elph-Mcnc: 1.3 g/dL (ref 0.7–1.3)
IgA/Immunoglobulin A, Serum: 142 mg/dL (ref 87–352)
IgM (Immunoglobulin M), Srm: 121 mg/dL (ref 26–217)

## 2022-04-28 LAB — COMPREHENSIVE METABOLIC PANEL
ALT: 68 IU/L — ABNORMAL HIGH (ref 0–32)
AST: 49 IU/L — ABNORMAL HIGH (ref 0–40)
Albumin/Globulin Ratio: 2 (ref 1.2–2.2)
Albumin: 5 g/dL — ABNORMAL HIGH (ref 3.9–4.9)
Alkaline Phosphatase: 148 IU/L — ABNORMAL HIGH (ref 44–121)
BUN/Creatinine Ratio: 10 — ABNORMAL LOW (ref 12–28)
BUN: 8 mg/dL (ref 8–27)
Bilirubin Total: 0.3 mg/dL (ref 0.0–1.2)
CO2: 22 mmol/L (ref 20–29)
Calcium: 10.1 mg/dL (ref 8.7–10.3)
Chloride: 102 mmol/L (ref 96–106)
Creatinine, Ser: 0.81 mg/dL (ref 0.57–1.00)
Globulin, Total: 2.5 g/dL (ref 1.5–4.5)
Glucose: 134 mg/dL — ABNORMAL HIGH (ref 70–99)
Potassium: 4.4 mmol/L (ref 3.5–5.2)
Sodium: 143 mmol/L (ref 134–144)
Total Protein: 7.5 g/dL (ref 6.0–8.5)
eGFR: 82 mL/min/{1.73_m2} (ref >59)

## 2022-04-28 LAB — SEDIMENTATION RATE: Sed Rate: 4 mm/hr (ref 0–40)

## 2022-04-28 LAB — METHYLMALONIC ACID, SERUM: Methylmalonic Acid: 130 nmol/L (ref 0–378)

## 2022-04-28 LAB — VITAMIN E: Vitamin E (Alpha Tocopherol): 11.7 mg/L (ref 9.0–29.0)

## 2022-04-28 LAB — ANA COMPREHENSIVE PANEL
Anti JO-1: <0.2 AI (ref 0.0–0.9)
Chromatin Ab SerPl-aCnc: <0.2 AI (ref 0.0–0.9)
ENA SSB (LA) Ab: <0.2 AI (ref 0.0–0.9)

## 2022-04-28 LAB — HEMOGLOBIN A1C
Est. average glucose Bld gHb Est-mCnc: 140 mg/dL
Hgb A1c MFr Bld: 6.5 % — ABNORMAL HIGH (ref 4.8–5.6)

## 2022-04-28 LAB — ANGIOTENSIN CONVERTING ENZYME: Angio Convert Enzyme: 74 U/L (ref 14–82)

## 2022-04-28 LAB — LIPID PANEL: Chol/HDL Ratio: 4.1 ratio (ref 0.0–4.4)

## 2022-04-28 LAB — RHEUMATOID FACTOR: Rheumatoid fact SerPl-aCnc: 10.3 IU/mL (ref <14.0)

## 2022-04-28 NOTE — Telephone Encounter (Signed)
Lab results faxed to Dr Clelia Croft per Dr Lucia Gaskins. Received a receipt of confirmation.

## 2022-04-29 ENCOUNTER — Other Ambulatory Visit (HOSPITAL_COMMUNITY): Payer: Self-pay

## 2022-04-29 MED ORDER — FREESTYLE LIBRE 14 DAY SENSOR MISC
6 refills | Status: DC
  Filled 2022-04-29 – 2022-05-05 (×3): qty 2, 28d supply, fill #0
  Filled 2022-06-03: qty 2, 28d supply, fill #1
  Filled 2022-07-04: qty 2, 28d supply, fill #2
  Filled 2022-08-04: qty 2, 28d supply, fill #3
  Filled 2022-09-04 – 2022-09-05 (×2): qty 2, 28d supply, fill #4

## 2022-04-30 ENCOUNTER — Other Ambulatory Visit: Payer: Self-pay

## 2022-05-01 ENCOUNTER — Other Ambulatory Visit: Payer: Self-pay

## 2022-05-01 ENCOUNTER — Other Ambulatory Visit (HOSPITAL_COMMUNITY): Payer: Self-pay

## 2022-05-05 ENCOUNTER — Other Ambulatory Visit: Payer: Self-pay

## 2022-05-05 ENCOUNTER — Encounter: Payer: Self-pay | Admitting: Pharmacist

## 2022-05-05 ENCOUNTER — Other Ambulatory Visit (HOSPITAL_COMMUNITY): Payer: Self-pay

## 2022-05-06 NOTE — Telephone Encounter (Signed)
PA has been APPROVED from 02/18/2022-02/18/2023 

## 2022-05-12 ENCOUNTER — Other Ambulatory Visit (HOSPITAL_COMMUNITY): Payer: Self-pay

## 2022-05-12 ENCOUNTER — Other Ambulatory Visit: Payer: Self-pay

## 2022-05-12 MED ORDER — TEMAZEPAM 15 MG PO CAPS
15.0000 mg | ORAL_CAPSULE | Freq: Every evening | ORAL | 0 refills | Status: DC | PRN
  Filled 2022-05-12: qty 60, 30d supply, fill #0

## 2022-05-13 ENCOUNTER — Other Ambulatory Visit (HOSPITAL_COMMUNITY): Payer: Self-pay

## 2022-05-13 MED ORDER — HYDROCODONE BIT-HOMATROP MBR 5-1.5 MG/5ML PO SOLN
5.0000 mL | Freq: Four times a day (QID) | ORAL | 0 refills | Status: DC | PRN
  Filled 2022-05-13: qty 120, 6d supply, fill #0

## 2022-05-14 ENCOUNTER — Other Ambulatory Visit (HOSPITAL_COMMUNITY): Payer: Self-pay

## 2022-05-14 ENCOUNTER — Other Ambulatory Visit: Payer: Self-pay

## 2022-05-21 ENCOUNTER — Other Ambulatory Visit: Payer: Self-pay | Admitting: Hematology and Oncology

## 2022-05-30 ENCOUNTER — Encounter: Payer: Self-pay | Admitting: Dietician

## 2022-05-30 ENCOUNTER — Encounter: Payer: Commercial Managed Care - HMO | Attending: Family Medicine | Admitting: Dietician

## 2022-05-30 VITALS — Ht 62.0 in | Wt 138.0 lb

## 2022-05-30 DIAGNOSIS — E119 Type 2 diabetes mellitus without complications: Secondary | ICD-10-CM | POA: Insufficient documentation

## 2022-05-30 NOTE — Patient Instructions (Addendum)
Keep up the good work! Aim to stay active most days.  Start with 15 minutes or so and increase as tolerated to 30 minutes per day. (Videos, peddler, trike, etc.)  Plan:  Aim for 2-3 Carb Choices per meal (30-45 grams) +/- 1 either way  Aim for 0-1 Carbs per snack if hungry  Include protein in moderation with your meals and snacks Consider reading food labels for Total Carbohydrate of foods Consider checking BG at alternate times per day

## 2022-05-30 NOTE — Progress Notes (Signed)
Diabetes Self-Management Education  Visit Type: First/Initial  Appt. Start Time: 0910 Appt. End Time: 1030  05/30/2022  Ms. Kathy Barker, identified by name and date of birth, is a 62 y.o. female with a diagnosis of Diabetes: Type 2.   ASSESSMENT Patient is here today alone.  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), HLD, osteopenia, gastroparesis, GERD Medications include: Labs noted to include A1C 6.5%, eGFR 82, AST 49, ALT 68, Alk phosphatase 148, Cholesterol 183, Triglycerides 242, HDL 41, LDL 97, vitamin B-12 620, folate 17.4, thiamine 125.3 on 04/22/2022 Medications include:  MVI CGM:  FreeStyle Libre 2  (currently paying $75 out of pocket)  CGM Results from download:   % Time CGM active:   78 %   (Goal >70%)  Average glucose:   118 mg/dL for 14 days  Glucose management indicator:     Time in range (70-180 mg/dL):   95 %   (Goal >78%)  Time High (181-250 mg/dL):   5 %   (Goal < 29%)  Time Very High (>250 mg/dL):    0 %   (Goal < 5%)  Time Low (54-69 mg/dL):   0 %   (Goal <5%)  Time Very Low (<54 mg/dL):   0 %   (Goal <6%)   Weight hx: 62" 138 lbs Weight stable since chemo.   Gained 25 lbs with chemo due to the steroids.  Patient lives alone.  She is not employed and on disability.  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  Limited income.  Height 5\' 2"  (1.575 m), weight 138 lb (62.6 kg). Body mass index is 25.24 kg/m.   Diabetes Self-Management Education - 05/30/22 0938       Visit Information   Visit Type First/Initial      Initial Visit   Diabetes Type Type 2    Date Diagnosed 04/2022    Are you currently following a meal plan? Yes    What type of meal plan do you follow? diabetic    Are you taking your medications as prescribed? Not on Medications      Health Coping   How would you rate your  overall health? Fair      Psychosocial Assessment   Patient Belief/Attitude about Diabetes Motivated to manage diabetes    Self-care barriers None    Self-management support Doctor's office    Other persons present Patient    Patient Concerns Nutrition/Meal planning;Glycemic Control    Special Needs None    Preferred Learning Style No preference indicated    Learning Readiness Ready    How often do you need to have someone help you when you read instructions, pamphlets, or other written materials from your doctor or pharmacy? 1 - Never    What is the last grade level you completed in school? 2 years college      Pre-Education Assessment   Patient understands the diabetes disease and treatment process. Needs Instruction    Patient understands incorporating nutritional management into lifestyle. Needs Instruction    Patient undertands incorporating physical activity into lifestyle. Needs Instruction    Patient understands using medications safely. Needs Instruction    Patient understands monitoring blood glucose, interpreting and using results Needs Instruction    Patient understands prevention, detection, and treatment of acute complications. Needs Instruction    Patient understands prevention,  detection, and treatment of chronic complications. Needs Instruction    Patient understands how to develop strategies to address psychosocial issues. Needs Instruction    Patient understands how to develop strategies to promote health/change behavior. Needs Instruction      Complications   Last HgB A1C per patient/outside source 6.5 %   04/2022   How often do you check your blood sugar? > 4 times/day    Fasting Blood glucose range (mg/dL) 16-109    Postprandial Blood glucose range (mg/dL) 604-540;981-191;>478    Number of hypoglycemic episodes per month 0    Number of hyperglycemic episodes ( >200mg /dL): Rare    Can you tell when your blood sugar is high? Yes    Have you had a dilated eye exam  in the past 12 months? No    Have you had a dental exam in the past 12 months? No    Are you checking your feet? Yes    How many days per week are you checking your feet? 7      Dietary Intake   Breakfast plain oatmeal (instant), margarine OR boiled eggs (2) OR oatmeal bread (toast), PB    Lunch tuna salad sandwich (1/2), blueberries    Snack (afternoon) none    Dinner boiled shrimp   4 pm   Snack (evening) fruit (banana or berries)    Beverage(s) water, coffee with fat free milk and monk fruit with allulose, hot tea (herbal) with beet sugar      Activity / Exercise   Activity / Exercise Type Light (walking / raking leaves)      Patient Education   Previous Diabetes Education No    Disease Pathophysiology Definition of diabetes, type 1 and 2, and the diagnosis of diabetes    Healthy Eating Role of diet in the treatment of diabetes and the relationship between the three main macronutrients and blood glucose level;Food label reading, portion sizes and measuring food.;Plate Method;Meal options for control of blood glucose level and chronic complications.;Reviewed blood glucose goals for pre and post meals and how to evaluate the patients' food intake on their blood glucose level.    Being Active Role of exercise on diabetes management, blood pressure control and cardiac health.;Helped patient identify appropriate exercises in relation to his/her diabetes, diabetes complications and other health issue.    Monitoring Identified appropriate SMBG and/or A1C goals.;Daily foot exams;Yearly dilated eye exam;Taught/evaluated CGM (comment)    Acute complications Taught prevention, symptoms, and  treatment of hypoglycemia - the 15 rule.;Discussed and identified patients' prevention, symptoms, and treatment of hyperglycemia.    Chronic complications Relationship between chronic complications and blood glucose control    Diabetes Stress and Support Identified and addressed patients feelings and concerns  about diabetes;Worked with patient to identify barriers to care and solutions;Role of stress on diabetes      Individualized Goals (developed by patient)   Nutrition General guidelines for healthy choices and portions discussed    Physical Activity Exercise 5-7 days per week;15 minutes per day    Medications take my medication as prescribed    Monitoring  Consistenly use CGM    Problem Solving Addressing barriers to behavior change      Post-Education Assessment   Patient understands the diabetes disease and treatment process. Comprehends key points    Patient understands incorporating nutritional management into lifestyle. Comprehends key points    Patient undertands incorporating physical activity into lifestyle. Comprehends key points    Patient understands using medications safely. Comphrehends  key points    Patient understands monitoring blood glucose, interpreting and using results Comprehends key points    Patient understands prevention, detection, and treatment of acute complications. Comprehends key points    Patient understands prevention, detection, and treatment of chronic complications. Comprehends key points    Patient understands how to develop strategies to address psychosocial issues. Comprehends key points    Patient understands how to develop strategies to promote health/change behavior. Comprehends key points      Outcomes   Expected Outcomes Demonstrated interest in learning. Expect positive outcomes    Future DMSE 3-4 months    Program Status Not Completed             Individualized Plan for Diabetes Self-Management Training:   Learning Objective:  Patient will have a greater understanding of diabetes self-management. Patient education plan is to attend individual and/or group sessions per assessed needs and concerns.   Plan:   Patient Instructions  Keep up the good work! Aim to stay active most days.  Start with 15 minutes or so and increase as tolerated  to 30 minutes per day. (Videos, peddler, trike, etc.)  Plan:  Aim for 2-3 Carb Choices per meal (30-45 grams) +/- 1 either way  Aim for 0-1 Carbs per snack if hungry  Include protein in moderation with your meals and snacks Consider reading food labels for Total Carbohydrate of foods Consider checking BG at alternate times per day        Expected Outcomes:  Demonstrated interest in learning. Expect positive outcomes  Education material provided: ADA - How to Thrive: A Guide for Your Journey with Diabetes, Food label handouts, Meal plan card, Snack sheet, and Diabetes Resources, ACL[facility] [employer] of Lifestyle Medicine) packet  If problems or questions, patient to contact team via:  Phone  Future DSME appointment: 3-4 months

## 2022-06-03 ENCOUNTER — Encounter: Payer: Self-pay | Admitting: Adult Health

## 2022-06-03 ENCOUNTER — Other Ambulatory Visit (HOSPITAL_COMMUNITY): Payer: Self-pay

## 2022-06-04 ENCOUNTER — Other Ambulatory Visit: Payer: Self-pay

## 2022-06-04 ENCOUNTER — Other Ambulatory Visit (HOSPITAL_COMMUNITY): Payer: Self-pay

## 2022-06-04 ENCOUNTER — Encounter: Payer: Self-pay | Admitting: Pharmacist

## 2022-06-04 MED ORDER — ROSUVASTATIN CALCIUM 5 MG PO TABS
5.0000 mg | ORAL_TABLET | Freq: Every day | ORAL | 1 refills | Status: DC
  Filled 2022-06-04: qty 90, 90d supply, fill #0
  Filled 2022-09-04 – 2022-09-05 (×2): qty 90, 90d supply, fill #1

## 2022-06-04 MED ORDER — TEMAZEPAM 15 MG PO CAPS
15.0000 mg | ORAL_CAPSULE | Freq: Every evening | ORAL | 0 refills | Status: DC | PRN
  Filled 2022-06-04 – 2022-06-09 (×3): qty 60, 30d supply, fill #0

## 2022-06-09 ENCOUNTER — Other Ambulatory Visit: Payer: Self-pay

## 2022-06-10 ENCOUNTER — Other Ambulatory Visit: Payer: Self-pay

## 2022-06-30 ENCOUNTER — Ambulatory Visit (INDEPENDENT_AMBULATORY_CARE_PROVIDER_SITE_OTHER): Payer: Self-pay | Admitting: Neurology

## 2022-06-30 ENCOUNTER — Ambulatory Visit (INDEPENDENT_AMBULATORY_CARE_PROVIDER_SITE_OTHER): Payer: Commercial Managed Care - HMO | Admitting: Neurology

## 2022-06-30 DIAGNOSIS — G620 Drug-induced polyneuropathy: Secondary | ICD-10-CM

## 2022-06-30 DIAGNOSIS — E7800 Pure hypercholesterolemia, unspecified: Secondary | ICD-10-CM

## 2022-06-30 DIAGNOSIS — R200 Anesthesia of skin: Secondary | ICD-10-CM

## 2022-06-30 DIAGNOSIS — T451X5A Adverse effect of antineoplastic and immunosuppressive drugs, initial encounter: Secondary | ICD-10-CM

## 2022-06-30 DIAGNOSIS — Z0289 Encounter for other administrative examinations: Secondary | ICD-10-CM

## 2022-06-30 DIAGNOSIS — R202 Paresthesia of skin: Secondary | ICD-10-CM

## 2022-07-01 NOTE — Progress Notes (Unsigned)
Works in inpatient rehab 7 days on/off  Charge by time I spent 30 minutes of face-to-face and non-face-to-face time with patient on the migraine botox procedure explaining placement and discussing protocol.  First botox here but is transitioning and > 50% improvement in migraine and headache freq and severity on botox. Used samples.   Consent Form Botulism Toxin Injection For Chronic Migraine  07/02/2022: First botox here but is transitioning and > 50% improvement in migraine and headache freq and severity on botox  Reviewed orally with patient, additionally signature is on file:  Botulism toxin has been approved by the Federal drug administration for treatment of chronic migraine. Botulism toxin does not cure chronic migraine and it may not be effective in some patients.  The administration of botulism toxin is accomplished by injecting a small amount of toxin into the muscles of the neck and head. Dosage must be titrated for each individual. Any benefits resulting from botulism toxin tend to wear off after 3 months with a repeat injection required if benefit is to be maintained. Injections are usually done every 3-4 months with maximum effect peak achieved by about 2 or 3 weeks. Botulism toxin is expensive and you should be sure of what costs you will incur resulting from the injection.  The side effects of botulism toxin use for chronic migraine may include:   -Transient, and usually mild, facial weakness with facial injections  -Transient, and usually mild, head or neck weakness with head/neck injections  -Reduction or loss of forehead facial animation due to forehead muscle weakness  -Eyelid drooping  -Dry eye  -Pain at the site of injection or bruising at the site of injection  -Double vision  -Potential unknown long term risks  Contraindications: You should not have Botox if you are pregnant, nursing, allergic to albumin, have an infection, skin condition, or muscle weakness at the  site of the injection, or have myasthenia gravis, Lambert-Eaton syndrome, or ALS.  It is also possible that as with any injection, there may be an allergic reaction or no effect from the medication. Reduced effectiveness after repeated injections is sometimes seen and rarely infection at the injection site may occur. All care will be taken to prevent these side effects. If therapy is given over a long time, atrophy and wasting in the muscle injected may occur. Occasionally the patient's become refractory to treatment because they develop antibodies to the toxin. In this event, therapy needs to be modified.  I have read the above information and consent to the administration of botulism toxin.    BOTOX PROCEDURE NOTE FOR MIGRAINE HEADACHE    Contraindications and precautions discussed with patient(above). Aseptic procedure was observed and patient tolerated procedure. Procedure performed by Dr. Toni Kmari Brian  The condition has existed for more than 6 months, and pt does not have a diagnosis of ALS, Myasthenia Gravis or Lambert-Eaton Syndrome.  Risks and benefits of injections discussed and pt agrees to proceed with the procedure.  Written consent obtained  These injections are medically necessary. Pt  receives good benefits from these injections. These injections do not cause sedations or hallucinations which the oral therapies may cause.  Description of procedure:  The patient was placed in a sitting position. The standard protocol was used for Botox as follows, with 5 units of Botox injected at each site:   -Procerus muscle, midline injection  -Corrugator muscle, bilateral injection  -Frontalis muscle, bilateral injection, with 2 sites each side, medial injection was performed in the upper one   third of the frontalis muscle, in the region vertical from the medial inferior edge of the superior orbital rim. The lateral injection was again in the upper one third of the forehead vertically above  the lateral limbus of the cornea, 1.5 cm lateral to the medial injection site.  -Temporalis muscle injection, 4 sites, bilaterally. The first injection was 3 cm above the tragus of the ear, second injection site was 1.5 cm to 3 cm up from the first injection site in line with the tragus of the ear. The third injection site was 1.5-3 cm forward between the first 2 injection sites. The fourth injection site was 1.5 cm posterior to the second injection site.   -Occipitalis muscle injection, 3 sites, bilaterally. The first injection was done one half way between the occipital protuberance and the tip of the mastoid process behind the ear. The second injection site was done lateral and superior to the first, 1 fingerbreadth from the first injection. The third injection site was 1 fingerbreadth superiorly and medially from the first injection site.  -Cervical paraspinal muscle injection, 2 sites, bilateral knee first injection site was 1 cm from the midline of the cervical spine, 3 cm inferior to the lower border of the occipital protuberance. The second injection site was 1.5 cm superiorly and laterally to the first injection site.  -Trapezius muscle injection was performed at 3 sites, bilaterally. The first injection site was in the upper trapezius muscle halfway between the inflection point of the neck, and the acromion. The second injection site was one half way between the acromion and the first injection site. The third injection was done between the first injection site and the inflection point of the neck.   Will return for repeat injection in 3 months.   200 units of Botox was used, 45 u Botox not injected was wasted. The patient tolerated the procedure well, there were no complications of the above procedure.    Normal  R. Extensor hallucis longus Normal None None None _______ Normal Normal Normal Normal  R. Abductor hallucis Normal None None None _______ Normal Normal Normal Normal  R. Lumbar paraspinals (low) Normal None None None _______ Normal Normal Normal Normal  R. Biceps femoris (long head) Normal None None None _______ Normal Normal Normal Normal

## 2022-07-03 ENCOUNTER — Encounter: Payer: Commercial Managed Care - HMO | Admitting: Neurology

## 2022-07-03 NOTE — Procedures (Signed)
Full Name: Kathy Barker Gender: Female MRN #: [id number] Date of Birth: 1960-06-27    Visit Date: 06/30/2022 14:26 Age: 62 Years Examining Physician: Dr. Naomie Dean Referring Physician: Dr. Naomie Dean Height: 5 feet 2 inch  History: Here for emg/ncs for chemotherapy-induced severe neuropathy. Still with neuropathic pain.  Extensive panel of blood work to rule out other causes did not show any other etiology.  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  The pain started in the feet and in the hands after chemotherapy, intense numbness, feels like she has cotton inside, continuous, decreased fine motor movement, imbalance, constant pain. We have put her on pain medication Lyrica, we are treating her pain, unfortunately after chemotherapy this is a very common condition and if it has not improved by now it is permanent.   Summary: NCS performed on all 4 limbs. EMG needle study performed on right arm and leg. All nerves and muscles (as indicated in the following tables) were within normal limits.       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  A small fiber neuropathy evades detection on this exam because it involves the smallest nerve fibers. EMG/NCS rules out other causes of peripheral neuropathy.  A small fiber neuropathy is often associated with chemotherapeutic agents and presents with pain, burning, numbness, tingling often in a stocking glove distribution with symptoms typically starting in the feet and extending superiorly and can affect both sensory and autonomic functions, cause fatigue, cognitive disturbances, headache, and widespread musculoskeletal pain.     ------------------------------- Naomie Dean, M.D.  [facility] [address], [address] [place], [place] [address] Tel:  [phone] Fax: [phone]  Verbal informed consent was obtained from the patient, patient was informed of potential risk of procedure, including bruising, bleeding, hematoma formation, infection, muscle weakness, muscle pain, numbness, among others.        MNC    Nerve / Sites Muscle Latency Ref. Amplitude Ref. Rel Amp Segments Distance Velocity Ref. Area    ms ms mV mV %  cm m/s m/s mVms  R Median - APB     Wrist APB 3.0 ?4.4 7.2 ?4.0 100 Wrist - APB 7   25.2     Upper arm APB 7.0  6.8  93.3 Upper arm - Wrist 22.6 57 ?49 24.7  L Median - APB     Wrist APB 2.7 ?4.4 6.8 ?4.0 100 Wrist - APB 7   26.4     Upper arm APB 6.6  6.8  100 Upper arm - Wrist 22 56 ?49 25.4  R Ulnar - ADM     Wrist ADM 2.5 ?3.3 7.6 ?6.0 100 Wrist - ADM 7   22.9     B.Elbow ADM 4.0  6.2  81.2 B.Elbow - Wrist 10 69 ?49 19.2     A.Elbow ADM 6.4  6.9  112 A.Elbow - B.Elbow 14 57 ?49 20.6  L Ulnar - ADM     Wrist ADM 2.6 ?3.3 6.8 ?6.0 100 Wrist - ADM 7   19.3     B.Elbow ADM 4.1  6.2  90.8 B.Elbow - Wrist 10 67 ?49 17.5     A.Elbow ADM 6.4  6.4  102 A.Elbow - B.Elbow 14 63 ?49 17.6  R Peroneal - EDB     Ankle EDB 5.0 ?6.5 6.0 ?2.0  100 Ankle - EDB 9   18.2     Fib head EDB 9.7  5.4  89.8 Fib head - Ankle 22 47 ?44 17.7     Pop fossa EDB 11.6  7.6  141 Pop fossa - Fib head 9 47 ?44 29.5         Pop fossa - Ankle      L Peroneal - EDB     Ankle EDB 8.5 ?6.5 0.6 ?2.0 100 Ankle - EDB 9   1.1     Fib head EDB 14.1  0.7  113 Fib head - Ankle 20 36 ?44      Pop fossa EDB 13.5  4.0  566 Pop fossa - Fib head 10 185 ?44 15.1         Pop fossa - Ankle      R Tibial - AH     Ankle AH 4.1 ?5.8 10.3 ?4.0 100 Ankle - AH 9   33.0     Pop fossa AH 12.0  9.4  91.4 Pop fossa - Ankle 33 42 ?41 28.4  L Tibial - AH     Ankle AH 5.5 ?5.8 10.6 ?4.0 100 Ankle - AH 9   41.5     Pop fossa AH 11.9  10.3  97.8 Pop fossa - Ankle 29 46 ?41 38.8                     SNC    Nerve / Sites Rec. Site Peak Lat Ref.  Amp Ref. Segments  Distance Peak Diff Ref.    ms ms V V  cm ms ms  R Sural - Ankle (Calf)     Calf Ankle 3.6 ?4.4 15 ?6 Calf - Ankle 14    L Sural - Ankle (Calf)     Calf Ankle 3.6 ?4.4 7 ?6 Calf - Ankle 14    R Superficial peroneal - Ankle     Lat leg Ankle 3.7 ?4.4 8 ?6 Lat leg - Ankle 14    L Superficial peroneal - Ankle     Lat leg Ankle 3.8 ?4.4 7 ?6 Lat leg - Ankle 14    R Median, Ulnar - Transcarpal comparison     Median Palm Wrist 1.9 ?2.2 42 ?35 Median Palm - Wrist 8       Ulnar Palm Wrist 1.8 ?2.2 23 ?12 Ulnar Palm - Wrist 8          Median Palm - Ulnar Palm  0.1 ?0.4  L Median, Ulnar - Transcarpal comparison     Median Palm Wrist 2.0 ?2.2 39 ?35 Median Palm - Wrist 8       Ulnar Palm Wrist 1.8 ?2.2 15 ?12 Ulnar Palm - Wrist 8          Median Palm - Ulnar Palm  0.1 ?0.4  R Median - Orthodromic (Dig II, Mid palm)     Dig II Wrist 2.8 ?3.4 11 ?10 Dig II - Wrist 13    L Median - Orthodromic (Dig II, Mid palm)     Dig II Wrist 2.5 ?3.4 15 ?10 Dig II - Wrist 13    R Ulnar - Orthodromic, (Dig V, Mid palm)     Dig V Wrist 2.7 ?3.1 6 ?5 Dig V - Wrist 11    L Ulnar - Orthodromic, (Dig V, Mid palm)     Dig V Wrist 2.6 ?3.1 8 ?5 Dig V - Wrist 11  F  Wave    Nerve F Lat Ref.   ms ms  R Ulnar - ADM 23.4 ?32.0  L Ulnar - ADM 22.9 ?32.0  R Tibial - AH 50.5 ?56.0  L Tibial - AH 48.3 ?56.0             EMG Summary Table    Spontaneous MUAP Recruitment  Muscle IA Fib PSW Fasc Other Amp Dur. Poly Pattern  R. Cervical paraspinals (low) Normal None None None _______ Normal Normal Normal Normal  R. Deltoid Normal None None None _______ Normal Normal Normal Normal  R. Triceps brachii Normal None None None _______ Normal Normal Normal Normal  R. Pronator teres Normal None None None _______ Normal Normal Normal Normal  R. First dorsal interosseous Normal None None None _______ Normal Normal Normal Normal  R. Opponens pollicis Normal None None None _______ Normal Normal Normal  Normal  R. Vastus medialis Normal None None None _______ Normal Normal Normal Normal  R. Tibialis anterior Normal None None None _______ Normal Normal Normal Normal  R. Gastrocnemius (Medial head) Normal None None None _______ Normal Normal Normal Normal  R. Extensor hallucis longus Normal None None None _______ Normal Normal Normal Normal  R. Abductor hallucis Normal None None None _______ Normal Normal Normal Normal  R. Lumbar paraspinals (low) Normal None None None _______ Normal Normal Normal Normal  R. Biceps femoris (long head) Normal None None None _______ Normal Normal Normal Normal

## 2022-07-04 ENCOUNTER — Other Ambulatory Visit: Payer: Self-pay

## 2022-07-04 ENCOUNTER — Other Ambulatory Visit (HOSPITAL_COMMUNITY): Payer: Self-pay

## 2022-07-04 MED ORDER — TEMAZEPAM 15 MG PO CAPS
15.0000 mg | ORAL_CAPSULE | Freq: Every evening | ORAL | 0 refills | Status: DC | PRN
  Filled 2022-07-08: qty 60, 30d supply, fill #0

## 2022-07-07 ENCOUNTER — Other Ambulatory Visit: Payer: Self-pay

## 2022-07-08 ENCOUNTER — Other Ambulatory Visit: Payer: Self-pay

## 2022-07-08 ENCOUNTER — Other Ambulatory Visit (HOSPITAL_COMMUNITY): Payer: Self-pay

## 2022-07-09 ENCOUNTER — Other Ambulatory Visit: Payer: Self-pay

## 2022-07-11 ENCOUNTER — Other Ambulatory Visit (HOSPITAL_COMMUNITY): Payer: Self-pay

## 2022-07-15 ENCOUNTER — Other Ambulatory Visit (HOSPITAL_COMMUNITY): Payer: Self-pay

## 2022-07-16 ENCOUNTER — Other Ambulatory Visit (HOSPITAL_COMMUNITY): Payer: Self-pay

## 2022-07-16 ENCOUNTER — Other Ambulatory Visit: Payer: Self-pay

## 2022-07-16 MED ORDER — HYDROCODONE BIT-HOMATROP MBR 5-1.5 MG/5ML PO SOLN
5.0000 mL | Freq: Four times a day (QID) | ORAL | 0 refills | Status: DC | PRN
  Filled 2022-07-16: qty 120, 6d supply, fill #0

## 2022-08-04 ENCOUNTER — Other Ambulatory Visit (HOSPITAL_COMMUNITY): Payer: Self-pay

## 2022-08-05 ENCOUNTER — Other Ambulatory Visit: Payer: Self-pay

## 2022-08-05 ENCOUNTER — Other Ambulatory Visit (HOSPITAL_COMMUNITY): Payer: Self-pay

## 2022-08-05 MED ORDER — TEMAZEPAM 15 MG PO CAPS
15.0000 mg | ORAL_CAPSULE | Freq: Every evening | ORAL | 0 refills | Status: DC | PRN
  Filled 2022-08-05: qty 60, 30d supply, fill #0

## 2022-08-06 ENCOUNTER — Other Ambulatory Visit: Payer: Self-pay

## 2022-08-12 ENCOUNTER — Other Ambulatory Visit (HOSPITAL_COMMUNITY): Payer: Self-pay

## 2022-08-14 ENCOUNTER — Other Ambulatory Visit: Payer: Self-pay

## 2022-08-14 ENCOUNTER — Other Ambulatory Visit (HOSPITAL_COMMUNITY): Payer: Self-pay

## 2022-08-15 ENCOUNTER — Other Ambulatory Visit (HOSPITAL_COMMUNITY): Payer: Self-pay

## 2022-08-19 ENCOUNTER — Other Ambulatory Visit (HOSPITAL_COMMUNITY): Payer: Self-pay

## 2022-08-20 ENCOUNTER — Other Ambulatory Visit (HOSPITAL_COMMUNITY): Payer: Self-pay

## 2022-08-20 ENCOUNTER — Other Ambulatory Visit: Payer: Self-pay

## 2022-08-20 MED ORDER — HYDROCODONE BIT-HOMATROP MBR 5-1.5 MG/5ML PO SOLN
5.0000 mL | Freq: Four times a day (QID) | ORAL | 0 refills | Status: DC
  Filled 2022-08-20: qty 120, 6d supply, fill #0

## 2022-08-28 ENCOUNTER — Ambulatory Visit: Payer: Commercial Managed Care - HMO | Admitting: Dietician

## 2022-09-04 ENCOUNTER — Other Ambulatory Visit (HOSPITAL_COMMUNITY): Payer: Self-pay

## 2022-09-05 ENCOUNTER — Other Ambulatory Visit (HOSPITAL_COMMUNITY): Payer: Self-pay

## 2022-09-05 ENCOUNTER — Other Ambulatory Visit: Payer: Self-pay

## 2022-09-05 MED ORDER — TEMAZEPAM 15 MG PO CAPS
15.0000 mg | ORAL_CAPSULE | Freq: Every evening | ORAL | 0 refills | Status: DC | PRN
  Filled 2022-09-05: qty 60, 30d supply, fill #0

## 2022-09-08 ENCOUNTER — Other Ambulatory Visit (HOSPITAL_COMMUNITY): Payer: Self-pay

## 2022-09-26 ENCOUNTER — Other Ambulatory Visit (HOSPITAL_COMMUNITY): Payer: Self-pay

## 2022-09-30 ENCOUNTER — Other Ambulatory Visit (HOSPITAL_COMMUNITY): Payer: Self-pay

## 2022-09-30 MED ORDER — HYDROCODONE BIT-HOMATROP MBR 5-1.5 MG/5ML PO SOLN
5.0000 mL | Freq: Four times a day (QID) | ORAL | 0 refills | Status: DC | PRN
  Filled 2022-09-30: qty 120, 6d supply, fill #0

## 2022-10-01 ENCOUNTER — Other Ambulatory Visit: Payer: Self-pay

## 2022-10-07 ENCOUNTER — Other Ambulatory Visit (HOSPITAL_COMMUNITY): Payer: Self-pay

## 2022-10-08 ENCOUNTER — Other Ambulatory Visit (HOSPITAL_COMMUNITY): Payer: Self-pay

## 2022-10-08 MED ORDER — TEMAZEPAM 15 MG PO CAPS
15.0000 mg | ORAL_CAPSULE | Freq: Every evening | ORAL | 0 refills | Status: DC | PRN
Start: 2022-10-08 — End: 2023-09-02
  Filled 2022-10-08: qty 60, 30d supply, fill #0

## 2022-10-15 ENCOUNTER — Other Ambulatory Visit (HOSPITAL_COMMUNITY): Payer: Self-pay

## 2022-10-15 MED ORDER — TEMAZEPAM 15 MG PO CAPS
15.0000 mg | ORAL_CAPSULE | Freq: Every evening | ORAL | 0 refills | Status: DC | PRN
  Filled 2022-10-15 – 2022-11-17 (×2): qty 60, 30d supply, fill #0

## 2022-10-16 ENCOUNTER — Other Ambulatory Visit (HOSPITAL_COMMUNITY): Payer: Self-pay

## 2022-10-17 ENCOUNTER — Other Ambulatory Visit (HOSPITAL_COMMUNITY): Payer: Self-pay

## 2022-10-20 ENCOUNTER — Other Ambulatory Visit: Payer: Self-pay

## 2022-10-29 NOTE — Telephone Encounter (Signed)
TC

## 2022-11-03 ENCOUNTER — Other Ambulatory Visit (HOSPITAL_COMMUNITY): Payer: Self-pay

## 2022-11-04 ENCOUNTER — Other Ambulatory Visit (HOSPITAL_COMMUNITY): Payer: Self-pay

## 2022-11-04 MED ORDER — HYDROCODONE BIT-HOMATROP MBR 5-1.5 MG/5ML PO SOLN
5.0000 mL | Freq: Four times a day (QID) | ORAL | 0 refills | Status: DC | PRN
  Filled 2022-11-04 (×2): qty 120, 6d supply, fill #0

## 2022-11-18 ENCOUNTER — Other Ambulatory Visit: Payer: Self-pay

## 2022-11-18 ENCOUNTER — Encounter (HOSPITAL_COMMUNITY): Payer: Self-pay

## 2022-11-18 ENCOUNTER — Other Ambulatory Visit (HOSPITAL_COMMUNITY): Payer: Self-pay

## 2022-11-20 ENCOUNTER — Other Ambulatory Visit (HOSPITAL_BASED_OUTPATIENT_CLINIC_OR_DEPARTMENT_OTHER): Payer: Self-pay

## 2022-11-21 ENCOUNTER — Other Ambulatory Visit: Payer: Self-pay

## 2022-11-27 ENCOUNTER — Other Ambulatory Visit: Payer: Self-pay | Admitting: Internal Medicine

## 2022-11-27 ENCOUNTER — Other Ambulatory Visit (HOSPITAL_COMMUNITY): Payer: Self-pay

## 2022-11-28 ENCOUNTER — Other Ambulatory Visit: Payer: Self-pay

## 2022-11-28 ENCOUNTER — Other Ambulatory Visit (HOSPITAL_COMMUNITY): Payer: Self-pay

## 2022-11-28 MED ORDER — OMEPRAZOLE 40 MG PO CPDR
40.0000 mg | DELAYED_RELEASE_CAPSULE | Freq: Two times a day (BID) | ORAL | 2 refills | Status: DC
  Filled 2022-11-28: qty 180, 90d supply, fill #0

## 2022-11-29 ENCOUNTER — Other Ambulatory Visit (HOSPITAL_COMMUNITY): Payer: Self-pay

## 2022-11-29 MED ORDER — HYDROCODONE BIT-HOMATROP MBR 5-1.5 MG/5ML PO SOLN
5.0000 mL | Freq: Four times a day (QID) | ORAL | 0 refills | Status: DC | PRN
  Filled 2022-11-29 – 2022-12-09 (×2): qty 120, 6d supply, fill #0

## 2022-12-01 ENCOUNTER — Other Ambulatory Visit (HOSPITAL_COMMUNITY): Payer: Self-pay

## 2022-12-02 ENCOUNTER — Other Ambulatory Visit (HOSPITAL_COMMUNITY): Payer: Self-pay

## 2022-12-09 ENCOUNTER — Other Ambulatory Visit: Payer: Self-pay

## 2022-12-09 ENCOUNTER — Other Ambulatory Visit (HOSPITAL_COMMUNITY): Payer: Self-pay

## 2022-12-14 ENCOUNTER — Other Ambulatory Visit (HOSPITAL_COMMUNITY): Payer: Self-pay

## 2022-12-16 ENCOUNTER — Other Ambulatory Visit (HOSPITAL_COMMUNITY): Payer: Self-pay

## 2022-12-16 MED ORDER — TEMAZEPAM 15 MG PO CAPS
15.0000 mg | ORAL_CAPSULE | Freq: Every evening | ORAL | 0 refills | Status: DC | PRN
  Filled 2022-12-16 – 2022-12-19 (×2): qty 60, 30d supply, fill #0

## 2022-12-19 ENCOUNTER — Other Ambulatory Visit (HOSPITAL_COMMUNITY): Payer: Self-pay

## 2023-01-20 ENCOUNTER — Other Ambulatory Visit (HOSPITAL_COMMUNITY): Payer: Self-pay

## 2023-01-21 ENCOUNTER — Other Ambulatory Visit: Payer: Self-pay

## 2023-01-21 ENCOUNTER — Other Ambulatory Visit (HOSPITAL_COMMUNITY): Payer: Self-pay

## 2023-01-21 MED ORDER — HYDROCODONE BIT-HOMATROP MBR 5-1.5 MG/5ML PO SOLN
5.0000 mL | Freq: Four times a day (QID) | ORAL | 0 refills | Status: DC | PRN
  Filled 2023-01-21: qty 120, 6d supply, fill #0

## 2023-01-21 MED ORDER — TEMAZEPAM 15 MG PO CAPS
15.0000 mg | ORAL_CAPSULE | Freq: Every evening | ORAL | 0 refills | Status: DC | PRN
  Filled 2023-01-21: qty 60, 30d supply, fill #0

## 2023-02-05 ENCOUNTER — Telehealth: Payer: Self-pay

## 2023-02-05 NOTE — Telephone Encounter (Signed)
Pharmacy Patient Advocate Encounter   Received notification from CoverMyMeds that prior authorization for Omeprazole 40 mg dr Laurina Bustle required/requested.   Insurance verification completed.   The patient is insured through [employer] .   Per test claim: PA required; PA submitted to above mentioned insurance via CoverMyMeds Key/confirmation #/EOC [id number] Status is pending

## 2023-03-04 ENCOUNTER — Other Ambulatory Visit (HOSPITAL_COMMUNITY): Payer: Self-pay

## 2023-03-04 MED ORDER — HYDROCODONE BIT-HOMATROP MBR 5-1.5 MG/5ML PO SOLN
5.0000 mL | Freq: Four times a day (QID) | ORAL | 0 refills | Status: DC
  Filled 2023-03-04: qty 120, 6d supply, fill #0

## 2023-03-04 MED ORDER — TEMAZEPAM 15 MG PO CAPS
15.0000 mg | ORAL_CAPSULE | Freq: Every evening | ORAL | 0 refills | Status: DC | PRN
  Filled 2023-03-04: qty 60, 30d supply, fill #0

## 2023-03-06 ENCOUNTER — Other Ambulatory Visit (HOSPITAL_COMMUNITY): Payer: Self-pay

## 2023-03-07 ENCOUNTER — Other Ambulatory Visit (HOSPITAL_COMMUNITY): Payer: Self-pay

## 2023-03-16 ENCOUNTER — Telehealth: Payer: Self-pay

## 2023-03-16 NOTE — Progress Notes (Deleted)
 [facility]  Telephone:([phone]) [phone] [phone]    ID: Kathy Barker DOB: 07-15-1960  MR#: [id number]  HYQ#:[id number]  Patient Care Team: Lupita Raider, MD as PCP - General (Family Medicine) Griselda Miner, MD as Consulting Physician (General Surgery) Lonie Peak, MD as Attending Physician (Radiation Oncology) Janalyn Harder, MD (Inactive) as Consulting Physician (Dermatology) Rachel Moulds, MD as Consulting Physician (Hematology and Oncology) Rachel Moulds, MD OTHER MD:  CHIEF COMPLAINT: Left-sided breast cancer  CURRENT TREATMENT: observation  INTERVAL HISTORY:  Vonette returns today for follow up.  Rest of the pertinent 10 point ROS reviewed and negative  REVIEW OF SYSTEMS:   COVID 19 VACCINATION STATUS: She has had both immunizations, most recently April 2021   HISTORY OF CURRENT ILLNESS: From the original intake note:  Kathy Barker herself noted a palpable lower-inner left breast lump and immediately brought it to medical attention.. She underwent bilateral diagnostic mammography with tomography and left breast ultrasonography at [facility] on 05/30/2019 showing: breast density category B; 1.7 cm lobulated mass in left breast at 8 o'clock; no significant left axillary abnormalities.  Accordingly on 06/01/2019 she proceeded to biopsy of the left breast area in question. The pathology from this procedure ([id number]) showed: invasive ductal carcinoma, grade 3. Prognostic indicators significant for: estrogen receptor, 70% positive with weak staining intensity and progesterone receptor, 0% negative. Proliferation marker Ki67 at 80%. HER2 negative by immunohistochemistry (1+).  The patient's subsequent history is as detailed below.   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 by Dr Johney Frame    PAST SURGICAL HISTORY: Past Surgical History:  Procedure Laterality Date   BREAST LUMPECTOMY WITH RADIOACTIVE SEED AND SENTINEL LYMPH NODE BIOPSY Left 12/14/2019   Procedure: LEFT BREAST LUMPECTOMY X 2 WITH RADIOACTIVE SEED AND SENTINEL LYMPH NODE BIOPSY;  Surgeon: Griselda Miner, MD;  Location: [facility];  Service: General;  Laterality: Left;  PEC BLOCK   CESAREAN SECTION  01/13/1990   COLOSTOMY     COMBINED HYSTERECTOMY VAGINAL / OOPHORECTOMY / A&P REPAIR  01/13/1997   IR IMAGING GUIDED PORT INSERTION  06/30/2019   IR REMOVAL TUN ACCESS W/ PORT W/O FL MOD SED  05/24/2020   SUPRAVENTRICULAR TACHYCARDIA ABLATION  11/18/2012   slow pathway modification of AVNRT by Dr Johney Frame   SUPRAVENTRICULAR TACHYCARDIA ABLATION N/A 11/18/2012   Procedure: SUPRAVENTRICULAR TACHYCARDIA ABLATION;  Surgeon: Gardiner Rhyme, MD;  Location: [facility];  Service: Cardiovascular;  Laterality: N/A;   UPPER GASTROINTESTINAL ENDOSCOPY     VAGINAL HYSTERECTOMY  ~ 2000    FAMILY HISTORY: Family History  Problem Relation Age of Onset   Breast cancer Mother    Neuropathy Mother    Heart attack Father    Alcohol abuse Father  Heart attack Paternal Grandfather    Heart Problems Other        both sides of family   Rectal cancer Neg Hx    Stomach cancer Neg Hx    Colon cancer Neg Hx    Esophageal cancer Neg Hx    Her father died at age 44 from alcohol abuse. Her mother is living at age 82 (as of 05/2019) and has a history of DCIS at age 34 and invasive lobular  breast cancer at age 78. Kathy Barker has one brother. She reports cancer of an unknown type in her maternal grandmother and prostate cancer in a maternal uncle.   GYNECOLOGIC HISTORY:  No LMP recorded. Patient has had a hysterectomy. Menarche: 63 years old Age at first live birth: 63 years old GX P 2 LMP 2000 Contraceptive: never used HRT never used  Hysterectomy? yes BSO? 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  She lost her job during her chemo treatments.  She is divorced. She lives at home with son Victory Dakin, age 7, who works in Aeronautical engineer in [place] and is working towards a business degree. Son Timothy Lasso, age 31, works in Engineer, petroleum here in [place] and just recently moved to [place] to start a cryptocurrency business.  The patient is not a Actor.     ADVANCED DIRECTIVES: Not in place. She intends to name both of her sons as her HCPOA.   HEALTH MAINTENANCE: Social History   Tobacco Use   Smoking status: Never   Smokeless tobacco: Never  Vaping Use   Vaping status: Never Used  Substance Use Topics   Alcohol use: Yes    Comment: 11/18/2012 "shot of brandy couple times/month"   Drug use: No     GI: Upper endoscopy 09/05/2020  PAP: date unsure  Bone density: never done   Allergies  Allergen Reactions   Fentanyl Nausea And Vomiting    Per patient " extreme nausea and vomiting "    Other Other (See Comments)    Problems with stitches   Simvastatin Other (See Comments)   Venlafaxine Other (See Comments)   Versed [Midazolam] Nausea And Vomiting    Per patient " extreme N/V"    Latex Rash    Current Outpatient Medications  Medication Sig Dispense Refill   Continuous Glucose Sensor (FREESTYLE LIBRE 14 DAY SENSOR) MISC Use as directed to check blood sugar, change every 14 days 2 each 6   HYDROcodone bit-homatropine (HYCODAN) 5-1.5 MG/5ML syrup Take 5 mLs by  mouth every 6 (six) hours as needed. 120 mL 0   HYDROcodone bit-homatropine (HYCODAN) 5-1.5 MG/5ML syrup Take 5 mLs by mouth every 6 (six) hours as needed. 120 mL 0   HYDROcodone bit-homatropine (HYCODAN) 5-1.5 MG/5ML syrup Take 5 mLs by mouth every 6 (six) hours. 120 mL 0   HYDROcodone bit-homatropine (HYCODAN) 5-1.5 MG/5ML syrup Take 5 mLs by mouth every 6 (six) hours as needed. 120 mL 0   HYDROcodone bit-homatropine (HYCODAN) 5-1.5 MG/5ML syrup Take 5 mLs by mouth every 6 (six) hours as needed. 120 mL 0   HYDROcodone bit-homatropine (HYCODAN) 5-1.5 MG/5ML syrup Take 5 mLs by mouth every 6 (six) hours as needed. 120 mL 0   HYDROcodone bit-homatropine (HYCODAN) 5-1.5 MG/5ML syrup Take 5 mLs by mouth every 6 (six) hours. 120 mL 0   Multiple Vitamin (MULTIVITAMIN PO) Take by mouth 2 (two) times daily.     omeprazole (  PRILOSEC) 40 MG capsule Take 1 capsule (40 mg total) by mouth 2 (two) times daily, 30 minutes before breakfast and supper. 180 capsule 2   pregabalin (LYRICA) 100 MG capsule Take 1 capsule (100 mg total) by mouth 2 (two) times daily. 60 capsule 6   rosuvastatin (CRESTOR) 5 MG tablet Take 1 tablet (5 mg total) by mouth daily. 90 tablet 1   sertraline (ZOLOFT) 100 MG tablet Take 2 tablets (200 mg total) by mouth daily. 180 tablet 1   temazepam (RESTORIL) 15 MG capsule Take 1-2 capsules (15-30 mg total) by mouth at bedtime as needed. 60 capsule 0   temazepam (RESTORIL) 15 MG capsule Take 1-2 capsules (15-30 mg total) by mouth at bedtime as needed. 60 capsule 0   temazepam (RESTORIL) 15 MG capsule Take 1-2 capsules (15-30 mg total) by mouth at bedtime as needed. 60 capsule 0   temazepam (RESTORIL) 15 MG capsule Take 1-2 capsules (15-30 mg total) by mouth at bedtime as needed. 60 capsule 0   temazepam (RESTORIL) 15 MG capsule Take 1-2 capsules (15-30 mg total) by mouth at bedtime as needed. 60 capsule 0   temazepam (RESTORIL) 15 MG capsule Take 1-2 capsules (15-30 mg total) by mouth at  bedtime as needed. 60 capsule 0   No current facility-administered medications for this visit.    OBJECTIVE: White woman in no acute distress There were no vitals filed for this visit.    There is no height or weight on file to calculate BMI.   Wt Readings from Last 3 Encounters:  06/30/22 140 lb (63.5 kg)  05/30/22 138 lb (62.6 kg)  04/22/22 140 lb (63.5 kg)     ECOG FS:1 - Symptomatic but completely ambulatory  PE not done telephone visit.   LAB RESULTS:  CMP     Component Value Date/Time   NA 143 04/22/2022 0932   K 4.4 04/22/2022 0932   CL 102 04/22/2022 0932   CO2 22 04/22/2022 0932   GLUCOSE 134 (H) 04/22/2022 0932   GLUCOSE 140 (H) 02/21/2021 1213   BUN 8 04/22/2022 0932   CREATININE 0.81 04/22/2022 0932   CREATININE 1.00 02/21/2021 1213   CALCIUM 10.1 04/22/2022 0932   PROT 7.5 04/22/2022 0932   ALBUMIN 5.0 (H) 04/22/2022 0932   AST 49 (H) 04/22/2022 0932   AST 28 02/21/2021 1213   ALT 68 (H) 04/22/2022 0932   ALT 39 02/21/2021 1213   ALKPHOS 148 (H) 04/22/2022 0932   BILITOT 0.3 04/22/2022 0932   BILITOT 0.4 02/21/2021 1213   GFRNONAA >60 02/21/2021 1213   GFRAA >60 10/04/2019 1241    No results found for: "TOTALPROTELP", "ALBUMINELP", "A1GS", "A2GS", "BETS", "BETA2SER", "GAMS", "MSPIKE", "SPEI"  Lab Results  Component Value Date   WBC 6.3 02/21/2021   NEUTROABS 4.0 02/21/2021   HGB 13.8 02/21/2021   HCT 41.3 02/21/2021   MCV 90.2 02/21/2021   PLT 219 02/21/2021    No results found for: "LABCA2"  No components found for: "RUEAVW098"  No results for input(s): "INR" in the last 168 hours.  No results found for: "LABCA2"  No results found for: "JXB147"  No results found for: "CAN125"  No results found for: "CAN153"  No results found for: "CA2729"  No components found for: "HGQUANT"  No results found for: "CEA1", "CEA" / No results found for: "CEA1", "CEA"   No results found for: "AFPTUMOR"  No results found for: "CHROMOGRNA"  No  results found for: "KPAFRELGTCHN", "LAMBDASER", "KAPLAMBRATIO" (kappa/lambda light chains)  No results found for: "HGBA", "HGBA2QUANT", "HGBFQUANT", "HGBSQUAN" (Hemoglobinopathy evaluation)   No results found for: "LDH"  No results found for: "IRON", "TIBC", "IRONPCTSAT" (Iron and TIBC)  No results found for: "FERRITIN"  Urinalysis No results found for: "COLORURINE", "APPEARANCEUR", "LABSPEC", "PHURINE", "GLUCOSEU", "HGBUR", "BILIRUBINUR", "KETONESUR", "PROTEINUR", "UROBILINOGEN", "NITRITE", "LEUKOCYTESUR"   STUDIES: No results found.    ELIGIBLE FOR AVAILABLE RESEARCH PROTOCOL: no  ASSESSMENT: 62 y.o. [place] woman status post left breast lower inner quadrant biopsy 06/01/2019 for a clinical T1c N0, stage IB invasive ductal carcinoma, grade 3, weakly estrogen receptor positive but functionally triple negative, with an MIB-1 of 80%.  (1) Oncotype score of 51 predicts a risk of recurrence outside the breast in the next 9 years of greater than 39% if the patient's only systemic therapy is antiestrogens for 5 years.  It also predicts a greater than 15% benefit from chemotherapy  (2) neoadjuvant chemotherapy consisting of doxorubicin and cyclophosphamide in dose dense fashion x4 started 06/30/2019, completed 08/23/2019, followed by weekly paclitaxel and carboplatin weekly x12 starting 09/13/2019, discontinued after 10/04/2019 dose  (a) carboplatin/paclitaxel discontinued after 3 doses with neuropathy developing  (b) repeat breast MRI 10/24/2019 shows resolution of the known malignancy  (3) Left lumpectomy on 12/14/2019 shows residual 0.3 cm invasive ductal carcinoma, margins negative, 3 sentinel lymph nodes negative  (a) repeat prognostic panel triple negative with an Mib-1 of 65%  (4) adjuvant radiation 01/24/2020 through 02/24/2020 Site Technique Total Dose (Gy) Dose per Fx (Gy) Completed Fx Beam Energies  Breast, Left: Breast_Lt 3D 40.05/40.05 2.67 15/15 6X, 10X  Breast, Left:  Breast_Lt_Bst 3D 10/10 2 5/5 6X, 10X    PLAN:    Total time: 30 min *Total Encounter Time as defined by the Centers for Medicare and Medicaid Services includes, in addition to the face-to-face time of a patient visit (documented in the note above) non-face-to-face time: obtaining and reviewing outside history, ordering and reviewing medications, tests or procedures, care coordination (communications with other health care professionals or caregivers) and documentation in the medical record.

## 2023-03-16 NOTE — Telephone Encounter (Signed)
 Left message on voicemail about appointment on 03/17/23

## 2023-03-17 ENCOUNTER — Telehealth: Payer: Self-pay | Admitting: Hematology and Oncology

## 2023-03-17 ENCOUNTER — Other Ambulatory Visit (HOSPITAL_COMMUNITY): Payer: Self-pay

## 2023-03-17 ENCOUNTER — Other Ambulatory Visit: Payer: Self-pay

## 2023-03-17 ENCOUNTER — Encounter (INDEPENDENT_AMBULATORY_CARE_PROVIDER_SITE_OTHER): Payer: Self-pay | Admitting: Otolaryngology

## 2023-03-17 ENCOUNTER — Inpatient Hospital Stay: Payer: Commercial Managed Care - HMO | Admitting: Hematology and Oncology

## 2023-03-17 MED ORDER — ROSUVASTATIN CALCIUM 5 MG PO TABS
5.0000 mg | ORAL_TABLET | Freq: Every day | ORAL | 1 refills | Status: DC
Start: 2023-03-17 — End: 2023-09-22
  Filled 2023-03-17: qty 90, 90d supply, fill #0
  Filled 2023-06-15: qty 90, 90d supply, fill #1

## 2023-03-17 NOTE — Telephone Encounter (Signed)
 [name]

## 2023-04-05 ENCOUNTER — Other Ambulatory Visit (HOSPITAL_COMMUNITY): Payer: Self-pay

## 2023-04-06 ENCOUNTER — Other Ambulatory Visit: Payer: Self-pay

## 2023-04-06 ENCOUNTER — Other Ambulatory Visit (HOSPITAL_COMMUNITY): Payer: Self-pay

## 2023-04-06 MED ORDER — TEMAZEPAM 15 MG PO CAPS
15.0000 mg | ORAL_CAPSULE | Freq: Every evening | ORAL | 0 refills | Status: DC | PRN
Start: 2023-04-06 — End: 2023-05-11
  Filled 2023-04-06: qty 60, 30d supply, fill #0

## 2023-04-06 NOTE — Telephone Encounter (Signed)
 Pharmacy Patient Advocate Encounter  Received notification from [facility] that Prior Authorization for Omeprazole 40MG  dr capsules has been APPROVED from 02-05-2023 to 02-04-2024   PA #/Case ID/Reference #: [id number]

## 2023-04-15 ENCOUNTER — Telehealth (INDEPENDENT_AMBULATORY_CARE_PROVIDER_SITE_OTHER): Payer: Self-pay | Admitting: Physician Assistant

## 2023-04-15 NOTE — Telephone Encounter (Signed)
 LVM to confirm appt & location [id number] afm

## 2023-04-16 ENCOUNTER — Encounter (INDEPENDENT_AMBULATORY_CARE_PROVIDER_SITE_OTHER): Payer: Self-pay

## 2023-04-16 ENCOUNTER — Other Ambulatory Visit (HOSPITAL_COMMUNITY): Payer: Self-pay

## 2023-04-16 ENCOUNTER — Ambulatory Visit (INDEPENDENT_AMBULATORY_CARE_PROVIDER_SITE_OTHER): Admitting: Audiology

## 2023-04-16 ENCOUNTER — Ambulatory Visit (INDEPENDENT_AMBULATORY_CARE_PROVIDER_SITE_OTHER): Admitting: Physician Assistant

## 2023-04-16 ENCOUNTER — Other Ambulatory Visit: Payer: Self-pay

## 2023-04-16 DIAGNOSIS — H903 Sensorineural hearing loss, bilateral: Secondary | ICD-10-CM

## 2023-04-16 DIAGNOSIS — H6990 Unspecified Eustachian tube disorder, unspecified ear: Secondary | ICD-10-CM | POA: Diagnosis not present

## 2023-04-16 DIAGNOSIS — H6993 Unspecified Eustachian tube disorder, bilateral: Secondary | ICD-10-CM

## 2023-04-16 DIAGNOSIS — H6983 Other specified disorders of Eustachian tube, bilateral: Secondary | ICD-10-CM

## 2023-04-16 MED ORDER — AZELASTINE HCL 0.1 % NA SOLN
2.0000 | Freq: Two times a day (BID) | NASAL | 12 refills | Status: AC
  Filled 2023-04-16: qty 30, 50d supply, fill #0
  Filled 2023-05-08 – 2023-06-15 (×2): qty 30, 50d supply, fill #1

## 2023-04-16 NOTE — Patient Instructions (Signed)
 Reflux gourmet- Please use this daily after each meal and before bedtime.

## 2023-04-16 NOTE — Progress Notes (Signed)
  [address], [address] [place], [place] [address] [phone]  Audiological Evaluation    Name: Kathy Barker     DOB:   [birth year]      MRN:   [id number]                                                                                     Service Date: 04/16/2023     Accompanied by: unaccompanied   Patient comes today after Eyvonne Mechanic, PA-C sent a referral for a hearing evaluation due to concerns with ear pain.   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  Tympanometry: Right ear: Type A- Normal external ear canal volume with normal middle ear pressure and tympanic membrane compliance Left ear: Type A- Normal external ear canal volume with normal middle ear pressure and tympanic membrane compliance    Pure tone Audiometry:  Normal to borderline normal hearing from [phone], except for a mild sensorineural hearing loss at 1000 Hz, bilaterally.    Speech Audiometry: Right ear- Speech Reception Threshold (SRT) was obtained at 25 dBHL. Left ear-Speech Reception Threshold (SRT) was obtained at 30 dBHL.   Word Recognition Score Tested using NU-6 (MLV) Right ear: 100% was obtained at a presentation level of 60 dBHL with contralateral masking which is deemed as  excellent. Left  ear: 100% was obtained at a presentation level of 60 dBHL with contralateral masking which is deemed as  excellent.   The hearing test results were completed under headphones and results are deemed to be of good reliability. Test technique:  conventional      Recommendations: Follow up with ENT as scheduled for today. Return for a hearing evaluation in 2 years, before if concerns with hearing changes arise or per MD recommendation.   Waleed Dettman MARIE LEROUX-MARTINEZ, AUD

## 2023-04-16 NOTE — Progress Notes (Signed)
 Dear Dr. Clelia Croft, Here is my assessment for our mutual patient, Kathy Barker. Thank you for allowing me the opportunity to care for your patient. Please do not hesitate to contact me should you have any other questions. Sincerely, Burna Forts PA-C  Otolaryngology Clinic Note Referring provider: Dr. Clelia Croft HPI:  Kathy Barker is a 63 y.o. female kindly referred by Dr. Clelia Croft   The patient presents today with complaints of ear pain.  The patient notes that in November 2024 she started having fullness in the bilateral ears associated with pressure and constant pain.  She notes that she felt this was secondary to earwax and was cleaning her ears aggressively with numerous modalities including ear candles.  She notes that while she was cleaning them the symptoms significantly improved.  She notes they continue to persist today.  She cannot find any relieving factors.  She notes that she saw her primary care provider and was told that she had fluid on the right ear and the left ear was retracted.  Today she notes continued pressure, right greater than left, she notes that she does not have any hearing loss but does struggle with hearing as the day progresses.  She denies any ringing, she notes a longstanding history of dizziness that can last for days, no vertiginous symptoms.  She notes approxi-6 weeks ago she had a bad sinus infection that has resolved.  She notes a past medical history of functional endoscopic nasal surgery   H&N Surgery: Functional endoscopic nasal surgery    Independent Review of Additional Tests or Records:      Tympanometry: Right ear: Type A- Normal external ear canal volume with normal middle ear pressure and tympanic membrane compliance Left ear: Type A- Normal external ear canal volume with normal middle ear pressure and tympanic membrane compliance     Pure tone Audiometry:  Normal to borderline normal hearing from [phone] Hz, except for a mild sensorineural hearing loss at  1000 Hz, bilaterally.     Speech Audiometry: Right ear- Speech Reception Threshold (SRT) was obtained at 25 dBHL. Left ear-Speech Reception Threshold (SRT) was obtained at 30 dBHL.   Word Recognition Score Tested using NU-6 (MLV) Right ear: 100% was obtained at a presentation level of 60 dBHL with contralateral masking which is deemed as  excellent. Left ear: 100% was obtained at a presentation level of 60 dBHL with contralateral masking which is deemed as  excellent.   The hearing test results were completed under headphones and results are deemed to be of good reliability. Test technique:  conventional     Mild sensorineural hearing loss bilateral  PMH/Meds/All/SocHx/FamHx/ROS:   Past Medical History:  Diagnosis Date   Allergic rhinitis    Anxiety    "only during my divorce" (11/18/2012)   Breast cancer (HCC)    Depression    "only during my divorce" (11/18/2012)   GERD (gastroesophageal reflux disease)    Hiatal hernia    High cholesterol    Hot flashes    IBS (irritable bowel syndrome)    Insomnia    Migraines    "lately more often; before SVT I'd get them ~ q 6 months" (11/18/2012)   Osteopenia    PONV (postoperative nausea and vomiting)    Prediabetes    mild   RAD (reactive airway disease)    SVT (supraventricular tachycardia)    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 by Dr Johney Frame     Past Surgical History:  Procedure Laterality Date  BREAST LUMPECTOMY WITH RADIOACTIVE SEED AND SENTINEL LYMPH NODE BIOPSY Left 12/14/2019   Procedure: LEFT BREAST LUMPECTOMY X 2 WITH RADIOACTIVE SEED AND SENTINEL LYMPH NODE BIOPSY;  Surgeon: Griselda Miner, MD;  Location: [facility];  Service: General;  Laterality: Left;  PEC BLOCK   CESAREAN SECTION  01/13/1990   COLOSTOMY     COMBINED HYSTERECTOMY VAGINAL / OOPHORECTOMY / A&P REPAIR  01/13/1997   IR IMAGING GUIDED PORT INSERTION  06/30/2019   IR REMOVAL TUN ACCESS W/ PORT W/O FL MOD SED  05/24/2020    SUPRAVENTRICULAR TACHYCARDIA ABLATION  11/18/2012   slow pathway modification of AVNRT by Dr Johney Frame   SUPRAVENTRICULAR TACHYCARDIA ABLATION N/A 11/18/2012   Procedure: SUPRAVENTRICULAR TACHYCARDIA ABLATION;  Surgeon: Gardiner Rhyme, MD;  Location: [facility] C[facility];  Service: Cardiovascular;  Laterality: N/A;   UPPER GASTROINTESTINAL ENDOSCOPY     VAGINAL HYSTERECTOMY  ~ 2000    Family History  Problem Relation Age of Onset   Breast cancer Mother    Neuropathy Mother    Heart attack Father    Alcohol abuse Father    Heart attack Paternal Grandfather    Heart Problems Other        both sides of family   Rectal cancer Neg Hx    Stomach cancer Neg Hx    Colon cancer Neg Hx    Esophageal cancer Neg Hx      Social Connections: Not on file      Current Outpatient Medications:    Continuous Glucose Sensor (FREESTYLE LIBRE 14 DAY SENSOR) MISC, Use as directed to check blood sugar, change every 14 days, Disp: 2 each, Rfl: 6   HYDROcodone bit-homatropine (HYCODAN) 5-1.5 MG/5ML syrup, Take 5 mLs by mouth every 6 (six) hours as needed., Disp: 120 mL, Rfl: 0   HYDROcodone bit-homatropine (HYCODAN) 5-1.5 MG/5ML syrup, Take 5 mLs by mouth every 6 (six) hours as needed., Disp: 120 mL, Rfl: 0   HYDROcodone bit-homatropine (HYCODAN) 5-1.5 MG/5ML syrup, Take 5 mLs by mouth every 6 (six) hours., Disp: 120 mL, Rfl: 0   HYDROcodone bit-homatropine (HYCODAN) 5-1.5 MG/5ML syrup, Take 5 mLs by mouth every 6 (six) hours as needed., Disp: 120 mL, Rfl: 0   HYDROcodone bit-homatropine (HYCODAN) 5-1.5 MG/5ML syrup, Take 5 mLs by mouth every 6 (six) hours as needed., Disp: 120 mL, Rfl: 0   HYDROcodone bit-homatropine (HYCODAN) 5-1.5 MG/5ML syrup, Take 5 mLs by mouth every 6 (six) hours as needed., Disp: 120 mL, Rfl: 0   HYDROcodone bit-homatropine (HYCODAN) 5-1.5 MG/5ML syrup, Take 5 mLs by mouth every 6 (six) hours., Disp: 120 mL, Rfl: 0   Multiple Vitamin (MULTIVITAMIN PO), Take by mouth 2 (two) times daily., Disp:  , Rfl:    omeprazole (PRILOSEC) 40 MG capsule, Take 1 capsule (40 mg total) by mouth 2 (two) times daily, 30 minutes before breakfast and supper., Disp: 180 capsule, Rfl: 2   pregabalin (LYRICA) 100 MG capsule, Take 1 capsule (100 mg total) by mouth 2 (two) times daily., Disp: 60 capsule, Rfl: 6   rosuvastatin (CRESTOR) 5 MG tablet, Take 1 tablet (5 mg total) by mouth daily., Disp: 90 tablet, Rfl: 1   sertraline (ZOLOFT) 100 MG tablet, Take 2 tablets (200 mg total) by mouth daily., Disp: 180 tablet, Rfl: 1   temazepam (RESTORIL) 15 MG capsule, Take 1-2 capsules (15-30 mg total) by mouth at bedtime as needed., Disp: 60 capsule, Rfl: 0   temazepam (RESTORIL) 15 MG capsule, Take 1-2 capsules (15-30  mg total) by mouth at bedtime as needed., Disp: 60 capsule, Rfl: 0   temazepam (RESTORIL) 15 MG capsule, Take 1-2 capsules (15-30 mg total) by mouth at bedtime as needed., Disp: 60 capsule, Rfl: 0   temazepam (RESTORIL) 15 MG capsule, Take 1-2 capsules (15-30 mg total) by mouth at bedtime as needed., Disp: 60 capsule, Rfl: 0   temazepam (RESTORIL) 15 MG capsule, Take 1-2 capsules (15-30 mg total) by mouth at bedtime as needed., Disp: 60 capsule, Rfl: 0   temazepam (RESTORIL) 15 MG capsule, Take 1-2 capsules (15-30 mg total) by mouth at bedtime as needed., Disp: 60 capsule, Rfl: 0   Physical Exam:   There were no vitals taken for this visit.  Pertinent Findings  CN II-XII intact  Bilateral EAC clear and TM intact with well pneumatized middle ear spaces Weber 512: equal Rinne 512: AC > BC b/l  Anterior rhinoscopy: Septum midline; bilateral inferior turbinates with no hypertrophy No lesions of oral cavity/oropharynx; dentition wnl No obviously palpable neck masses/lymphadenopathy/thyromegaly No respiratory distress or stridor  Seprately Identifiable Procedures:  None  Impression & Plans:  Kathy Barker is a 63 y.o. female with the following   Eustachian tube dysfunction-  The patient presents  today with symptoms most consistent with eustachian tube dysfunction.  She has reassuring audiological evaluation with normal tympanometry.  I have recommended medical management with continued nasal steroids, azelastine, and reflux Gourmet.  I would like to see the patient back in the office in approximate 3 months for repeat evaluation, I would consider nasal endoscopy at that time.  She is very concerned with her symptoms, I will also discuss tympanostomy tubes as well.   - f/u 3 months    Scope when she come back -  Talk about tubes- she wants it -      Thank you for allowing me the opportunity to care for your patient. Please do not hesitate to contact me should you have any other questions.  Sincerely, Burna Forts PA-C [facility] Phone: [phone] Fax: [phone]  04/16/2023, 9:07 AM

## 2023-04-18 ENCOUNTER — Other Ambulatory Visit (HOSPITAL_COMMUNITY): Payer: Self-pay

## 2023-04-21 ENCOUNTER — Other Ambulatory Visit: Payer: Self-pay

## 2023-04-21 ENCOUNTER — Other Ambulatory Visit (HOSPITAL_COMMUNITY): Payer: Self-pay

## 2023-05-08 ENCOUNTER — Other Ambulatory Visit (HOSPITAL_COMMUNITY): Payer: Self-pay

## 2023-05-11 ENCOUNTER — Other Ambulatory Visit (HOSPITAL_COMMUNITY): Payer: Self-pay

## 2023-05-11 ENCOUNTER — Other Ambulatory Visit: Payer: Self-pay

## 2023-05-11 MED ORDER — TEMAZEPAM 15 MG PO CAPS
15.0000 mg | ORAL_CAPSULE | Freq: Every evening | ORAL | 0 refills | Status: DC | PRN
Start: 2023-05-11 — End: 2023-09-02
  Filled 2023-05-11: qty 60, 30d supply, fill #0

## 2023-06-15 ENCOUNTER — Other Ambulatory Visit: Payer: Self-pay

## 2023-06-17 DIAGNOSIS — C4491 Basal cell carcinoma of skin, unspecified: Secondary | ICD-10-CM

## 2023-06-17 HISTORY — DX: Basal cell carcinoma of skin, unspecified: C44.91

## 2023-07-03 ENCOUNTER — Telehealth: Payer: Self-pay

## 2023-07-03 NOTE — Telephone Encounter (Signed)
 Left message to confirm appt for 6/23

## 2023-07-06 ENCOUNTER — Inpatient Hospital Stay: Attending: Hematology and Oncology | Admitting: Hematology and Oncology

## 2023-07-20 ENCOUNTER — Ambulatory Visit (INDEPENDENT_AMBULATORY_CARE_PROVIDER_SITE_OTHER): Admitting: Physician Assistant

## 2023-07-20 ENCOUNTER — Ambulatory Visit (INDEPENDENT_AMBULATORY_CARE_PROVIDER_SITE_OTHER): Admitting: Audiology

## 2023-07-20 ENCOUNTER — Encounter (INDEPENDENT_AMBULATORY_CARE_PROVIDER_SITE_OTHER): Payer: Self-pay | Admitting: Physician Assistant

## 2023-07-20 VITALS — BP 135/86 | HR 98

## 2023-07-20 DIAGNOSIS — H9203 Otalgia, bilateral: Secondary | ICD-10-CM | POA: Diagnosis not present

## 2023-07-20 DIAGNOSIS — H6993 Unspecified Eustachian tube disorder, bilateral: Secondary | ICD-10-CM

## 2023-07-20 NOTE — Progress Notes (Signed)
 Dear Dr. Loreli, Here is my assessment for our mutual patient, Kathy Barker. Thank you for allowing me the opportunity to care for your patient. Please do not hesitate to contact me should you have any other questions. Sincerely, Chyrl Cohen PA-C  Otolaryngology Clinic Note Referring provider: Dr. Loreli HPI:  Kathy Barker is a 63 y.o. female kindly referred by Dr. Loreli   The patient is a 63 year old female seen in our office for follow-up evaluation of ear pain.  The patient was last seen in the office on 04/16/2023.  Below is a recap of the encounter.  The patient presents today with complaints of ear pain.  The patient notes that in November 2024 she started having fullness in the bilateral ears associated with pressure and constant pain.  She notes that she felt this was secondary to earwax and was cleaning her ears aggressively with numerous modalities including ear candles.  She notes that while she was cleaning them the symptoms significantly improved.  She notes they continue to persist today.  She cannot find any relieving factors.  She notes that she saw her primary care provider and was told that she had fluid on the right ear and the left ear was retracted.  Today she notes continued pressure, right greater than left, she notes that she does not have any hearing loss but does struggle with hearing as the day progresses.  She denies any ringing, she notes a longstanding history of dizziness that can last for days, no vertiginous symptoms.  She notes approxi-6 weeks ago she had a bad sinus infection that has resolved.  She notes a past medical history of functional endoscopic nasal surgery     H&N Surgery: Functional endoscopic nasal surgery  Update 07/20/2023.  Since her last office visit she notes she has had persistent discomfort in the bilateral ears right greater than left.  She notes she flew to [place] and on the flight back had rather severe pain in her right ear.  She notes that the  symptoms have been consistently bothersome with fullness and pressure in the ears.  She denies any significant hearing loss.  She does report that she started using reflux Gourmet and was amazed at how well its helped, no significant acid reflux since that time.  Surgical history significant for functional endoscopic nasal surgery   Independent Review of Additional Tests or Records:  none   PMH/Meds/All/SocHx/FamHx/ROS:   Past Medical History:  Diagnosis Date   Allergic rhinitis    Anxiety    only during my divorce (11/18/2012)   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 by Dr Kelsie     Past Surgical History:  Procedure Laterality Date   BREAST LUMPECTOMY WITH RADIOACTIVE SEED AND SENTINEL LYMPH NODE BIOPSY Left 12/14/2019   Procedure: LEFT BREAST LUMPECTOMY X 2 WITH RADIOACTIVE SEED AND SENTINEL LYMPH NODE BIOPSY;  Surgeon: Curvin Deward MOULD, MD;  Location: [facility];  Service: General;  Laterality: Left;  PEC BLOCK   CESAREAN SECTION  01/13/1990   COLOSTOMY     COMBINED HYSTERECTOMY VAGINAL / OOPHORECTOMY / A&P REPAIR  01/13/1997   IR IMAGING GUIDED PORT INSERTION  06/30/2019   IR REMOVAL TUN ACCESS W/ PORT W/O FL MOD SED  05/24/2020   SUPRAVENTRICULAR TACHYCARDIA ABLATION  11/18/2012   slow pathway modification of AVNRT by Dr Kelsie   SUPRAVENTRICULAR TACHYCARDIA ABLATION N/A 11/18/2012   Procedure: SUPRAVENTRICULAR TACHYCARDIA ABLATION;  Surgeon: Lynwood JONETTA Kelsie, MD;  Location: [facility] CATH LAB;   Service: Cardiovascular;  Laterality: N/A;   UPPER GASTROINTESTINAL ENDOSCOPY     VAGINAL HYSTERECTOMY  ~ 2000    Family History  Problem Relation Age of Onset   Breast cancer Mother    Neuropathy Mother    Heart attack Father    Alcohol abuse Father    Heart attack Paternal Grandfather    Heart Problems Other        both sides of family   Rectal cancer Neg Hx    Stomach cancer Neg Hx    Colon cancer Neg Hx    Esophageal cancer Neg Hx      Social Connections: Not on file      Current Outpatient Medications:    azelastine  (ASTELIN ) 0.1 % nasal spray, Place 2 sprays into both nostrils 2 (two) times daily. Use in each nostril as directed, Disp: 30 mL, Rfl: 12   Continuous Glucose Sensor (FREESTYLE LIBRE 14 DAY SENSOR) MISC, Use as directed to check blood sugar, change every 14 days, Disp: 2 each, Rfl: 6   HYDROcodone  bit-homatropine (HYCODAN) 5-1.5 MG/5ML syrup, Take 5 mLs by mouth every 6 (six) hours as needed., Disp: 120 mL, Rfl: 0   HYDROcodone  bit-homatropine (HYCODAN) 5-1.5 MG/5ML syrup, Take 5 mLs by mouth every 6 (six) hours as needed., Disp: 120 mL, Rfl: 0   HYDROcodone  bit-homatropine (HYCODAN) 5-1.5 MG/5ML syrup, Take 5 mLs by mouth every 6 (six) hours., Disp: 120 mL, Rfl: 0   HYDROcodone  bit-homatropine (HYCODAN) 5-1.5 MG/5ML syrup, Take 5 mLs by mouth every 6 (six) hours as needed., Disp: 120 mL, Rfl: 0   HYDROcodone  bit-homatropine (HYCODAN) 5-1.5 MG/5ML syrup, Take 5 mLs by mouth every 6 (six) hours as needed., Disp: 120 mL, Rfl: 0   HYDROcodone  bit-homatropine (HYCODAN) 5-1.5 MG/5ML syrup, Take 5 mLs by mouth every 6 (six) hours as needed., Disp: 120 mL, Rfl: 0   HYDROcodone  bit-homatropine (HYCODAN) 5-1.5 MG/5ML syrup, Take 5 mLs by mouth every 6 (six) hours., Disp: 120 mL, Rfl: 0   Multiple Vitamin (MULTIVITAMIN PO), Take by mouth 2 (two) times daily., Disp: , Rfl:    omeprazole  (PRILOSEC) 40 MG capsule, Take 1 capsule (40 mg total) by mouth 2 (two) times daily, 30  minutes before breakfast and supper., Disp: 180 capsule, Rfl: 2   pregabalin  (LYRICA ) 100 MG capsule, Take 1 capsule (100 mg total) by mouth 2 (two) times daily., Disp: 60 capsule, Rfl: 6   rosuvastatin  (CRESTOR ) 5 MG tablet, Take 1 tablet (5 mg total) by mouth daily., Disp: 90 tablet, Rfl: 1   sertraline  (ZOLOFT ) 100 MG tablet, Take 2 tablets (200 mg total) by mouth daily., Disp: 180 tablet, Rfl: 1   temazepam  (RESTORIL ) 15 MG capsule, Take 1-2 capsules (15-30 mg total) by mouth at bedtime as needed., Disp: 60 capsule, Rfl: 0   temazepam  (RESTORIL ) 15 MG capsule, Take 1-2 capsules (15-30 mg total) by mouth at bedtime  as needed., Disp: 60 capsule, Rfl: 0   temazepam  (RESTORIL ) 15 MG capsule, Take 1-2 capsules (15-30 mg total) by mouth at bedtime as needed., Disp: 60 capsule, Rfl: 0   temazepam  (RESTORIL ) 15 MG capsule, Take 1-2 capsules (15-30 mg total) by mouth at bedtime as needed., Disp: 60 capsule, Rfl: 0   temazepam  (RESTORIL ) 15 MG capsule, Take 1-2 capsules (15-30 mg total) by mouth at bedtime as needed., Disp: 60 capsule, Rfl: 0   temazepam  (RESTORIL ) 15 MG capsule, Take 1-2 capsules (15-30 mg total) by mouth at bedtime as needed., Disp: 60 capsule, Rfl: 0   Physical Exam:   There were no vitals taken for this visit.  Pertinent Findings  CN II-XII intact Bilateral EAC clear and TM intact with well pneumatized middle ear space Anterior rhinoscopy: Septum midline; bilateral inferior turbinates with no hypertrophy No lesions of oral cavity/oropharynx; dentition wnl No obviously palpable neck masses/lymphadenopathy/thyromegaly No respiratory distress or stridor              Seprately Identifiable Procedures:  Procedure Note Pre-procedure diagnosis:  eustachian tube dysfunction  Post-procedure diagnosis: Same Procedure: Transnasal Fiberoptic Laryngoscopy, CPT 31575 - Mod 25 Indication: see above Complications: None apparent EBL: 0 mL   The procedure was undertaken to  further evaluate the patient's complaint of eustachian tube dysfunction, with mirror exam inadequate for appropriate examination due to gag reflex and poor patient tolerance   Procedure:  Patient was identified as correct patient. Verbal consent was obtained. The nose was sprayed with oxymetazoline and 4% lidocaine . The The flexible laryngoscope was passed through the nose to view the nasal cavity, pharynx (oropharynx, hypopharynx) and larynx.  The larynx was examined at rest and during multiple phonatory tasks. Documentation was obtained and reviewed with patient. The scope was removed. The patient tolerated the procedure well.   Findings: The nasal cavity and nasopharynx did not reveal any masses or lesions, mucosa appeared to be without obvious lesions. The tongue base, pharyngeal walls, piriform sinuses, vallecula, epiglottis and postcricoid region are normal in appearance. The visualized portion of the subglottis and proximal trachea is widely patent. The vocal folds are mobile bilaterally.. There are no lesions on the free edge of the vocal folds nor elsewhere in the larynx worrisome for malignancy.  Impression & Plans:  Kathy Barker is a 63 y.o. female with the following   Eustachian tube dysfunction-  63 year old female seen today for follow-up evaluation of bilateral ear pain.  Her symptoms are most consistent with eustachian tube dysfunction.  They worsened in her last flight from [place]  Her recent audiogram was reassuring with bilateral type a tympanometry.  She has attempted medical management with Flonase and antihistamines with no significant improvement in her symptoms.  This is very troublesome for her, she would like to discuss tympanostomy tubes.  I do think it is reasonable to have a discussion with Dr. Tobie on treatment options, I did speak with her that she had normal tympanometry but with the persistent and rather severe symptoms she would like to discuss any further surgical  options.  She will be placed on Dr. Tobie schedule, if advised her to reach out to me if she develops any new or worsening signs or symptoms in the meantime.   - f/u With Dr. Tobie    Thank you for allowing me the opportunity to care for your patient. Please do not hesitate to contact me should you have any other questions.  Sincerely, Chyrl Cohen PA-C [facility] Phone: [phone] Fax: [phone]  07/20/2023, 9:28 AM

## 2023-07-22 ENCOUNTER — Other Ambulatory Visit (HOSPITAL_COMMUNITY): Payer: Self-pay

## 2023-07-23 ENCOUNTER — Other Ambulatory Visit (HOSPITAL_COMMUNITY): Payer: Self-pay

## 2023-07-23 MED ORDER — TEMAZEPAM 15 MG PO CAPS
15.0000 mg | ORAL_CAPSULE | Freq: Every evening | ORAL | 0 refills | Status: DC | PRN
  Filled 2023-07-23: qty 60, 30d supply, fill #0

## 2023-07-24 ENCOUNTER — Other Ambulatory Visit: Payer: Self-pay

## 2023-07-31 ENCOUNTER — Other Ambulatory Visit (HOSPITAL_COMMUNITY): Payer: Self-pay

## 2023-09-02 ENCOUNTER — Inpatient Hospital Stay: Attending: Hematology and Oncology | Admitting: Hematology and Oncology

## 2023-09-02 VITALS — BP 140/73 | HR 103 | Temp 98.2°F | Resp 17 | Wt 133.5 lb

## 2023-09-02 DIAGNOSIS — Z811 Family history of alcohol abuse and dependence: Secondary | ICD-10-CM | POA: Insufficient documentation

## 2023-09-02 DIAGNOSIS — G479 Sleep disorder, unspecified: Secondary | ICD-10-CM | POA: Insufficient documentation

## 2023-09-02 DIAGNOSIS — F419 Anxiety disorder, unspecified: Secondary | ICD-10-CM | POA: Insufficient documentation

## 2023-09-02 DIAGNOSIS — Z170 Estrogen receptor positive status [ER+]: Secondary | ICD-10-CM | POA: Diagnosis not present

## 2023-09-02 DIAGNOSIS — F32A Depression, unspecified: Secondary | ICD-10-CM | POA: Diagnosis not present

## 2023-09-02 DIAGNOSIS — Z885 Allergy status to narcotic agent status: Secondary | ICD-10-CM | POA: Insufficient documentation

## 2023-09-02 DIAGNOSIS — Z1732 Human epidermal growth factor receptor 2 negative status: Secondary | ICD-10-CM | POA: Diagnosis not present

## 2023-09-02 DIAGNOSIS — Z90721 Acquired absence of ovaries, unilateral: Secondary | ICD-10-CM | POA: Diagnosis not present

## 2023-09-02 DIAGNOSIS — C50312 Malignant neoplasm of lower-inner quadrant of left female breast: Secondary | ICD-10-CM | POA: Insufficient documentation

## 2023-09-02 DIAGNOSIS — M8580 Other specified disorders of bone density and structure, unspecified site: Secondary | ICD-10-CM | POA: Diagnosis not present

## 2023-09-02 DIAGNOSIS — Z79899 Other long term (current) drug therapy: Secondary | ICD-10-CM | POA: Diagnosis not present

## 2023-09-02 DIAGNOSIS — Z90710 Acquired absence of both cervix and uterus: Secondary | ICD-10-CM | POA: Diagnosis not present

## 2023-09-02 DIAGNOSIS — Z803 Family history of malignant neoplasm of breast: Secondary | ICD-10-CM | POA: Diagnosis not present

## 2023-09-02 DIAGNOSIS — R531 Weakness: Secondary | ICD-10-CM | POA: Diagnosis not present

## 2023-09-02 DIAGNOSIS — G629 Polyneuropathy, unspecified: Secondary | ICD-10-CM | POA: Diagnosis not present

## 2023-09-02 DIAGNOSIS — Z8249 Family history of ischemic heart disease and other diseases of the circulatory system: Secondary | ICD-10-CM | POA: Insufficient documentation

## 2023-09-02 DIAGNOSIS — Z820 Family history of epilepsy and other diseases of the nervous system: Secondary | ICD-10-CM | POA: Insufficient documentation

## 2023-09-02 DIAGNOSIS — Z17421 Hormone receptor negative with human epidermal growth factor receptor 2 negative status: Secondary | ICD-10-CM | POA: Insufficient documentation

## 2023-09-02 DIAGNOSIS — R634 Abnormal weight loss: Secondary | ICD-10-CM | POA: Diagnosis not present

## 2023-09-02 DIAGNOSIS — Z9221 Personal history of antineoplastic chemotherapy: Secondary | ICD-10-CM | POA: Diagnosis not present

## 2023-09-02 NOTE — Progress Notes (Signed)
 [facility]  Telephone:([phone]) [phone] Fax:(336) [phone]    ID: [name] DOB: 02-10-1960  MR#: [id number]  RDW#:[id number]  Patient Care Team: Loreli Kins, MD as PCP - General (Family Medicine) Curvin Deward MOULD, MD as Consulting Physician (General Surgery) Izell Domino, MD as Attending Physician (Radiation Oncology) Livingston Rigg, MD as Consulting Physician (Dermatology) Loretha Ash, MD as Consulting Physician (Hematology and Oncology) Ash Loretha, MD OTHER MD:  CHIEF COMPLAINT: Left-sided breast cancer  CURRENT TREATMENT: observation  INTERVAL HISTORY:  Discussed the use of AI scribe software for clinical note transcription with the patient, who gave verbal consent to proceed.  History of Present Illness Kathy Barker is a 63 year old female with history of breast cancer neuropathy and osteopenia who presents for a follow-up visit.  She experiences worsening neuropathy, particularly at night, characterized by hot, tingly sensations in her feet and occasional 'jumping' sensations. She has discontinued Lyrica  due to ineffectiveness and is not currently taking any medication for neuropathy. She walks until her feet start to bother her and sometimes desires to soak her feet in cold water.  She is currently taking cholesterol medication and is weaning off temazepam , now down to one pill at night. She has stopped taking sertraline , Prilosec, and hycamtin. Her A1c had previously risen to 6.5 but returned to normal with dietary management, although she struggles with weight loss.  She experiences difficulty losing weight, having gained over 30 pounds, and fluctuates between 126 and 131 pounds. She attributes weight gain to steroid use during past treatments. She aims to reach 125 pounds and is trying to be more structured with her diet, particularly reducing carbohydrate intake.  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  She experiences sleep disturbances, particularly when her body and feet get hot, which affects her ability to sleep. She has tried [facility] in [place] which helped her sleep, but is cautious about its long-term use. She is exploring natural methods to improve her sleep cycle.  She is planning to move to [place] to be closer to her sons.  Rest of the pertinent 10 point ROS reviewed and negative  REVIEW OF SYSTEMS:   COVID 19 VACCINATION STATUS: She has had both immunizations, most recently April 2021   HISTORY OF CURRENT ILLNESS: From the original intake note:  Kathy Barker herself noted a palpable lower-inner left breast lump and immediately brought it to medical attention.. She underwent bilateral diagnostic mammography with tomography and left breast ultrasonography at [facility] on 05/30/2019 showing: breast density category B; 1.7 cm lobulated mass in left breast at 8 o'clock; no significant left axillary abnormalities.  Accordingly on 06/01/2019 she proceeded to biopsy of the left breast area in question. The pathology from this procedure ([id number]) showed: invasive ductal carcinoma, grade 3. Prognostic indicators significant for: estrogen receptor, 70% positive with weak staining intensity and progesterone receptor, 0% negative. Proliferation marker Ki67 at 80%. HER2 negative by immunohistochemistry (1+).  The patient's subsequent history is as detailed below.   PAST MEDICAL HISTORY: Past Medical History:  Diagnosis Date   Allergic rhinitis    Anxiety    only during my divorce (11/18/2012)   Breast cancer (HCC)    Depression    only during my divorce (11/18/2012)   GERD (gastroesophageal reflux disease)    Hiatal hernia    High cholesterol    Hot flashes    IBS (irritable bowel syndrome)  Insomnia    Migraines    lately more often; before SVT I'd get them ~ q 6 months (11/18/2012)   Osteopenia    PONV (postoperative nausea and  vomiting)    Prediabetes    mild   RAD (reactive airway disease)    SVT (supraventricular tachycardia) (HCC)    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 by Dr Kelsie    PAST SURGICAL HISTORY: Past Surgical History:  Procedure Laterality Date   BREAST LUMPECTOMY WITH RADIOACTIVE SEED AND SENTINEL LYMPH NODE BIOPSY Left 12/14/2019   Procedure: LEFT BREAST LUMPECTOMY X 2 WITH RADIOACTIVE SEED AND SENTINEL LYMPH NODE BIOPSY;  Surgeon: Curvin Deward MOULD, MD;  Location: [facility];  Service: General;  Laterality: Left;  PEC BLOCK   CESAREAN SECTION  01/13/1990   COLOSTOMY     COMBINED HYSTERECTOMY VAGINAL / OOPHORECTOMY / A&P REPAIR  01/13/1997   IR IMAGING GUIDED PORT INSERTION  06/30/2019   IR REMOVAL TUN ACCESS W/ PORT W/O FL MOD SED  05/24/2020   SUPRAVENTRICULAR TACHYCARDIA ABLATION  11/18/2012   slow pathway modification of AVNRT by Dr Kelsie   SUPRAVENTRICULAR TACHYCARDIA ABLATION N/A 11/18/2012   Procedure: SUPRAVENTRICULAR TACHYCARDIA ABLATION;  Surgeon: Lynwood JONETTA Kelsie, MD;  Location: [facility] CATH LAB;  Service: Cardiovascular;  Laterality: N/A;   UPPER GASTROINTESTINAL ENDOSCOPY     VAGINAL HYSTERECTOMY  ~ 2000    FAMILY HISTORY: Family History  Problem Relation Age of Onset   Breast cancer Mother    Neuropathy Mother    Heart attack Father    Alcohol abuse Father    Heart attack Paternal Grandfather    Heart Problems Other        both sides of family   Rectal cancer Neg Hx    Stomach cancer Neg Hx    Colon cancer Neg Hx    Esophageal cancer Neg Hx    Her father died at age 25 from alcohol abuse. Her mother is living at age 42 (as of 05/2019) and has a history of DCIS at age 91 and invasive lobular breast cancer at age 109. Kathy Barker has one brother. She reports cancer of an unknown type in her maternal grandmother and prostate cancer in a maternal uncle.   GYNECOLOGIC HISTORY:  No LMP recorded. Patient has had a hysterectomy. Menarche: 63 years old Age at first  live birth: 63 years old GX P 2 LMP 2000 Contraceptive: never used HRT never used  Hysterectomy? yes BSO? 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  She lost her job during her chemo treatments.  She is divorced. She lives at home with son Carlo, age 69, who works in Aeronautical engineer in [place] and is working towards a business degree. Son Christyne, age 75, works in Engineer, petroleum here in [place] and just recently moved to [place] to start a cryptocurrency business.  The patient is not a Actor.     ADVANCED DIRECTIVES: Not in place. She intends to name both of her sons as her HCPOA.   HEALTH MAINTENANCE: Social History   Tobacco Use   Smoking status: Never   Smokeless tobacco: Never  Vaping Use   Vaping status: Never Used  Substance Use Topics   Alcohol use: Yes    Comment: 11/18/2012 shot of brandy couple times/month   Drug use: No     GI: Upper endoscopy 09/05/2020  PAP: date unsure  Bone density:  never done   Allergies  Allergen Reactions   Fentanyl  Nausea And Vomiting    Per patient  extreme nausea and vomiting     Other Other (See Comments)    Problems with stitches   Simvastatin Other (See Comments)   Venlafaxine Other (See Comments)   Versed  [Midazolam ] Nausea And Vomiting    Per patient  extreme N/V    Latex Rash    Current Outpatient Medications  Medication Sig Dispense Refill   azelastine  (ASTELIN ) 0.1 % nasal spray Place 2 sprays into both nostrils 2 (two) times daily. Use in each nostril as directed 30 mL 12   Continuous Glucose Sensor (FREESTYLE LIBRE 14 DAY SENSOR) MISC Use as directed to check blood sugar, change every 14 days 2 each 6   HYDROcodone  bit-homatropine (HYCODAN) 5-1.5 MG/5ML syrup Take 5 mLs by mouth every 6 (six) hours as needed. 120 mL 0   HYDROcodone  bit-homatropine (HYCODAN) 5-1.5 MG/5ML syrup Take 5 mLs by mouth every 6  (six) hours as needed. 120 mL 0   HYDROcodone  bit-homatropine (HYCODAN) 5-1.5 MG/5ML syrup Take 5 mLs by mouth every 6 (six) hours. 120 mL 0   HYDROcodone  bit-homatropine (HYCODAN) 5-1.5 MG/5ML syrup Take 5 mLs by mouth every 6 (six) hours as needed. 120 mL 0   HYDROcodone  bit-homatropine (HYCODAN) 5-1.5 MG/5ML syrup Take 5 mLs by mouth every 6 (six) hours as needed. 120 mL 0   HYDROcodone  bit-homatropine (HYCODAN) 5-1.5 MG/5ML syrup Take 5 mLs by mouth every 6 (six) hours as needed. 120 mL 0   HYDROcodone  bit-homatropine (HYCODAN) 5-1.5 MG/5ML syrup Take 5 mLs by mouth every 6 (six) hours. 120 mL 0   Multiple Vitamin (MULTIVITAMIN PO) Take by mouth 2 (two) times daily.     omeprazole  (PRILOSEC) 40 MG capsule Take 1 capsule (40 mg total) by mouth 2 (two) times daily, 30 minutes before breakfast and supper. 180 capsule 2   pregabalin  (LYRICA ) 100 MG capsule Take 1 capsule (100 mg total) by mouth 2 (two) times daily. 60 capsule 6   rosuvastatin  (CRESTOR ) 5 MG tablet Take 1 tablet (5 mg total) by mouth daily. 90 tablet 1   sertraline  (ZOLOFT ) 100 MG tablet Take 2 tablets (200 mg total) by mouth daily. 180 tablet 1   temazepam  (RESTORIL ) 15 MG capsule Take 1-2 capsules (15-30 mg total) by mouth at bedtime as needed. 60 capsule 0   temazepam  (RESTORIL ) 15 MG capsule Take 1-2 capsules (15-30 mg total) by mouth at bedtime as needed. 60 capsule 0   temazepam  (RESTORIL ) 15 MG capsule Take 1-2 capsules (15-30 mg total) by mouth at bedtime as needed. 60 capsule 0   temazepam  (RESTORIL ) 15 MG capsule Take 1-2 capsules (15-30 mg total) by mouth at bedtime as needed. 60 capsule 0   temazepam  (RESTORIL ) 15 MG capsule Take 1-2 capsules (15-30 mg total) by mouth at bedtime as needed. 60 capsule 0   temazepam  (RESTORIL ) 15 MG capsule Take 1-2 capsules (15-30 mg total) by mouth at bedtime as needed. 60 capsule 0   temazepam  (RESTORIL ) 15 MG capsule Take 1-2 capsules (15-30 mg total) by mouth at bedtime as needed. 60  capsule 0   No current facility-administered medications for this visit.    OBJECTIVE: White woman in no acute distress Vitals:   09/02/23 1519  BP: (!) 140/73  Pulse: (!) 103  Resp: 17  Temp: 98.2 F (36.8 C)  SpO2: 98%     Body mass index is 24.42 kg/m.   Wt Readings  from Last 3 Encounters:  09/02/23 133 lb 8 oz (60.6 kg)  06/30/22 140 lb (63.5 kg)  05/30/22 138 lb (62.6 kg)     ECOG FS:1 - Symptomatic but completely ambulatory  She appears well, no acute distress No cervical or axillary adenopathy Bilateral breasts inspected and palpated. No palpable masses. No LE edema.   LAB RESULTS:  CMP     Component Value Date/Time   NA 143 04/22/2022 0932   K 4.4 04/22/2022 0932   CL 102 04/22/2022 0932   CO2 22 04/22/2022 0932   GLUCOSE 134 (H) 04/22/2022 0932   GLUCOSE 140 (H) 02/21/2021 1213   BUN 8 04/22/2022 0932   CREATININE 0.81 04/22/2022 0932   CREATININE 1.00 02/21/2021 1213   CALCIUM  10.1 04/22/2022 0932   PROT 7.5 04/22/2022 0932   ALBUMIN 5.0 (H) 04/22/2022 0932   AST 49 (H) 04/22/2022 0932   AST 28 02/21/2021 1213   ALT 68 (H) 04/22/2022 0932   ALT 39 02/21/2021 1213   ALKPHOS 148 (H) 04/22/2022 0932   BILITOT 0.3 04/22/2022 0932   BILITOT 0.4 02/21/2021 1213   GFRNONAA >60 02/21/2021 1213   GFRAA >60 10/04/2019 1241    No results found for: TOTALPROTELP, ALBUMINELP, A1GS, A2GS, BETS, BETA2SER, GAMS, MSPIKE, SPEI  Lab Results  Component Value Date   WBC 6.3 02/21/2021   NEUTROABS 4.0 02/21/2021   HGB 13.8 02/21/2021   HCT 41.3 02/21/2021   MCV 90.2 02/21/2021   PLT 219 02/21/2021    No results found for: LABCA2  No components found for: [id number]  No results for input(s): INR in the last 168 hours.  No results found for: LABCA2  No results found for: RJW800  No results found for: CAN125  No results found for: CAN153  No results found for: CA2729  No components found for: HGQUANT  No  results found for: CEA1, CEA / No results found for: CEA1, CEA   No results found for: AFPTUMOR  No results found for: CHROMOGRNA  No results found for: KPAFRELGTCHN, LAMBDASER, KAPLAMBRATIO (kappa/lambda light chains)  No results found for: HGBA, HGBA2QUANT, HGBFQUANT, HGBSQUAN (Hemoglobinopathy evaluation)   No results found for: LDH  No results found for: IRON, TIBC, IRONPCTSAT (Iron and TIBC)  No results found for: FERRITIN  Urinalysis No results found for: COLORURINE, APPEARANCEUR, LABSPEC, PHURINE, GLUCOSEU, HGBUR, BILIRUBINUR, KETONESUR, PROTEINUR, UROBILINOGEN, NITRITE, LEUKOCYTESUR   STUDIES: No results found.    ELIGIBLE FOR AVAILABLE RESEARCH PROTOCOL: no  ASSESSMENT: 63 y.o. [place] woman status post left breast lower inner quadrant biopsy 06/01/2019 for a clinical T1c N0, stage IB invasive ductal carcinoma, grade 3, weakly estrogen receptor positive but functionally triple negative, with an MIB-1 of 80%.  (1) Oncotype score of 51 predicts a risk of recurrence outside the breast in the next 9 years of greater than 39% if the patient's only systemic therapy is antiestrogens for 5 years.  It also predicts a greater than 15% benefit from chemotherapy  (2) neoadjuvant chemotherapy consisting of doxorubicin  and cyclophosphamide  in dose dense fashion x4 started 06/30/2019, completed 08/23/2019, followed by weekly paclitaxel  and carboplatin  weekly x12 starting 09/13/2019, discontinued after 10/04/2019 dose  (a) carboplatin /paclitaxel  discontinued after 3 doses with neuropathy developing  (b) repeat breast MRI 10/24/2019 shows resolution of the known malignancy  (3) Left lumpectomy on 12/14/2019 shows residual 0.3 cm invasive ductal carcinoma, margins negative, 3 sentinel lymph nodes negative  (a) repeat prognostic panel triple negative with an Mib-1 of 65%  (4) adjuvant radiation 01/24/2020  through  02/24/2020 Site Technique Total Dose (Gy) Dose per Fx (Gy) Completed Fx Beam Energies  Breast, Left: Breast_Lt 3D 40.05/40.05 2.67 15/15 6X, 10X  Breast, Left: Breast_Lt_Bst 3D 10/10 2 5/5 6X, 10X    PLAN: Assessment & Plan  History of breast cancer, in remission No concerns on ROS or PE Pt had a mammogram at [facility], will request a copy. Continue annual FU.  Peripheral neuropathy Chronic peripheral neuropathy with worsening nocturnal symptoms. Lyrica  was ineffective, currently managed with non-pharmacological methods. She doesn't want to consider any additional pharmacologic interventions.  Osteopenia Osteopenia managed with dietary and lifestyle measures - Continue current dietary and lifestyle measures to support bone health. - She wants to do bone density scan next yr.  Hyperlipidemia Hyperlipidemia managed with cholesterol medication, resumed after attempted discontinuation. - Continue current cholesterol medication. - Monitor cholesterol levels as per standard guidelines.  Polyarthritis of fingers Polyarthritis in fingers managed with turmeric supplementation,     Total time: 30 min *Total Encounter Time as defined by the Centers for Medicare and Medicaid Services includes, in addition to the face-to-face time of a patient visit (documented in the note above) non-face-to-face time: obtaining and reviewing outside history, ordering and reviewing medications, tests or procedures, care coordination (communications with other health care professionals or caregivers) and documentation in the medical record.

## 2023-09-07 ENCOUNTER — Encounter: Payer: Self-pay | Admitting: Dermatology

## 2023-09-10 ENCOUNTER — Ambulatory Visit (INDEPENDENT_AMBULATORY_CARE_PROVIDER_SITE_OTHER): Admitting: Dermatology

## 2023-09-10 ENCOUNTER — Encounter: Payer: Self-pay | Admitting: Dermatology

## 2023-09-10 VITALS — BP 160/92 | HR 96 | Temp 98.2°F

## 2023-09-10 DIAGNOSIS — C4491 Basal cell carcinoma of skin, unspecified: Secondary | ICD-10-CM

## 2023-09-10 DIAGNOSIS — L814 Other melanin hyperpigmentation: Secondary | ICD-10-CM

## 2023-09-10 DIAGNOSIS — L579 Skin changes due to chronic exposure to nonionizing radiation, unspecified: Secondary | ICD-10-CM | POA: Diagnosis not present

## 2023-09-10 DIAGNOSIS — C44319 Basal cell carcinoma of skin of other parts of face: Secondary | ICD-10-CM | POA: Diagnosis not present

## 2023-09-10 NOTE — Patient Instructions (Signed)

## 2023-09-10 NOTE — Progress Notes (Signed)
 Follow-Up Visit   Subjective  Kathy Barker is a 63 y.o. 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 The lesion was biopsied by Dr. Livingston on 06/17/2023. The lesion has been present for 4 years. Negative for pain and bleeding. States that as time passed it starting concaving in.   The following portions of the chart were reviewed this encounter and updated as appropriate: medications, allergies, medical history  Review of Systems:  No other skin or systemic complaints except as noted in HPI or Assessment and Plan.  Objective  Well appearing patient in no apparent distress; mood and affect are within normal limits.  A focused examination was performed of the following areas: Inferior mid forehead Relevant physical exam findings are noted in the Assessment and Plan.   Mid Forehead Depressed pink scar   Assessment & Plan   BASAL CELL CARCINOMA (BCC), UNSPECIFIED SITE Mid Forehead Mohs surgery  Consent obtained: written  Anticoagulation: Is the patient taking prescription anticoagulant and/or aspirin prescribed/recommended by a physician? No   Was the anticoagulation regimen changed prior to Mohs? No    Anesthesia: Anesthesia method: local infiltration Local anesthetic: lidocaine  1% WITH epi  Procedure Details: Timeout: pre-procedure verification complete Procedure Prep: patient was prepped and draped in usual sterile fashion Prep type: chlorhexidine  Biopsy accession number: [id number] Biopsy lab: [facility] Date of biopsy: 06/17/2023 Frozen section biopsy performed: No   Specimen debulked: No   Pre-Op diagnosis: basal cell carcinoma BCC subtype: nodular Surgery side: midline Surgical site (from skin exam): Mid Forehead Pre-operative length (cm): 0.8 Pre-operative width (cm): 0.6 Indications for Mohs surgery: anatomic location where tissue conservation is critical Previously treated? No     Micrographic Surgery Details: Post-operative length (cm): 2.2 Post-operative width (cm): 1.5 Number of Mohs stages: 2 Cumulative additional sections past 5 per stage: 0 Post surgery depth of defect: subcutaneous fat  Stage 1    Tumor features identified on Mohs section: basal carcinoma    Depth of tumor invasion after stage: dermis and subcutaneous fat  Stage 2    Tumor features identified on Mohs section: no tumor identified    Perineural invasion: no perineural invasion  Patient tolerance of procedure: tolerated well, no immediate complications  Reconstruction: Was the defect reconstructed? Yes   Was reconstruction performed by the same Mohs surgeon? Yes   Setting of reconstruction: outpatient office When was reconstruction performed? same day Type of reconstruction: linear Linear reconstruction: complex  Opioids: Did the patient receive a prescription for opioid/narcotic related to Mohs surgery?: No    Antibiotics: Does patient meet AHA guidelines for endocarditis?: No   Does patient meet AHA guidelines for orthopedic prophylaxis?: No   Were antibiotics given on the day of surgery?: No   Did surgery breach mucosa, expose cartilage/bone, involve an area of lymphedema/inflamed/infected tissue? No    Skin repair Complexity:  Complex Final length (cm):  4.8 Informed consent: discussed and consent obtained   Timeout: patient name, date of birth, surgical site, and procedure verified   Procedure prep:  Patient was prepped and draped in usual sterile fashion Prep type:  Chlorhexidine  Anesthesia: the lesion was anesthetized in a standard fashion   Anesthetic:  1% lidocaine  w/ epinephrine  1-100,000 buffered w/ 8.4% NaHCO3 Reason for type of repair: reduce tension to allow closure, allow closure of the large defect, preserve normal anatomy, avoid adjacent structures and allow side-to-side closure without requiring a flap or graft  Undermining: area extensively undermined    Subcutaneous layers (deep stitches):  Suture size:  4-0 Suture type: Vicryl (polyglactin 910)   Stitches:  Buried horizontal mattress Fine/surface layer approximation (top stitches):  Suture size:  6-0 Suture type: Vicryl (polyglactin 910)   Stitches: simple running   Hemostasis achieved with: suture, pressure and electrodesiccation Outcome: patient tolerated procedure well with no complications   Post-procedure details: sterile dressing applied and wound care instructions given   Dressing type: pressure dressing and petrolatum      Return in about 4 weeks (around 10/08/2023) for Wound Check.  LILLETTE Rollene Gobble, RN, am acting as scribe for RUFUS CHRISTELLA HOLY, MD .   09/10/2023  HISTORY OF PRESENT ILLNESS  Kathy Barker is seen in consultation at the request of Dr. Livingston for biopsy-proven Nodular Basal Cell Carcinoma on the forehead. They note that the area has been present for about 4 years increasing in size with time.  There is no history of previous treatment.  Reports no other new or changing lesions and has no other complaints today.  Medications and allergies: see patient chart.  Review of systems: Reviewed 8 systems and notable for the above skin cancer.  All other systems reviewed are unremarkable/negative, unless noted in the HPI. Past medical history, surgical history, family history, social history were also reviewed and are noted in the chart/questionnaire.    PHYSICAL EXAMINATION  General: Well-appearing, in no acute distress, alert and oriented x 4. Vitals reviewed in chart (if available).   Skin: Exam reveals a 0.8 x 0.6 cm erythematous papule and biopsy scar on the mid forehead. There are rhytids, telangiectasias, and lentigines, consistent with photodamage.  Biopsy report(s) reviewed, confirming the diagnosis.   ASSESSMENT  1) Nodular Basal Cell Carcinoma on the mid forehead 2) photodamage 3) solar lentigines   PLAN   1. Due to location, size,  histology, or recurrence and the likelihood of subclinical extension as well as the need to conserve normal surrounding tissue, the patient was deemed acceptable for Mohs micrographic surgery (MMS).  The nature and purpose of the procedure, associated benefits and risks including recurrence and scarring, possible complications such as pain, infection, and bleeding, and alternative methods of treatment if appropriate were discussed with the patient during consent. The lesion location was verified by the patient, by reviewing previous notes, pathology reports, and by photographs as well as angulation measurements if available.  Informed consent was reviewed and signed by the patient, and timeout was performed at 8:30 AM. See op note below.  2. For the photodamage and solar lentigines, sun protection discussed/information given on OTC sunscreens, and we recommend continued regular follow-up with primary dermatologist every 6 months or sooner for any growing, bleeding, or changing lesions. 3. Prognosis and future surveillance discussed. 4. Letter with treatment outcome sent to referring provider. 5. Pain acetaminophen /ibuprofen  MOHS MICROGRAPHIC SURGERY AND RECONSTRUCTION  Initial size:   0.8 x 0.6 cm Surgical defect/wound size: 2.2 x 1.5 cm Anesthesia:    0.33% lidocaine  with 1:200,000 epinephrine  EBL:    <5 mL Complications:  None Repair type:   Complex SQ suture:   4-0 Vicryl Cutaneous suture:  6-0 Plain gut Final size of the repair: 4.8 cm  Stages: 2  STAGE I: Anesthesia achieved with 0.5% lidocaine  with 1:200,000 epinephrine . ChloraPrep applied. 2 section(s) excised using Mohs technique (this includes total peripheral and deep tissue margin excision and evaluation with frozen sections, excised and interpreted by the same physician). The tumor was first  debulked and then excised with an approx. 2 mm margin.  Hemostasis was achieved with electrocautery as needed.  The specimen was then oriented,  subdivided/relaxed, inked, and processed using Mohs technique.    Frozen section analysis revealed a positive margin for composed of multiple small nodules with peripheral palisading and a haphazard arrangement of the more central cells in the deep margin.    STAGE II: An additional 2 mm margin was excised.  Hemostasis was achieved with electrocautery as needed.  The specimen was then oriented, subdivided/relaxed, inked, and processed using Mohs technique. Evaluation of slides by the Mohs surgeon revealed clear tumor margins.   Reconstruction  The surgical wound was then cleaned, prepped, and re-anesthetized as above. Wound edges were undermined extensively along at least one entire edge and at a distance equal to or greater than the width of the defect (see wound defect size above) in order to achieve closure and decrease wound tension and anatomic distortion. Redundant tissue repair including standing cone removal was performed. Hemostasis was achieved with electrocautery. Subcutaneous and epidermal tissues were approximated with the above sutures. The surgical site was then lightly scrubbed with sterile, saline-soaked gauze. The area was then bandaged using Vaseline ointment, non-adherent gauze, gauze pads, and tape to provide an adequate pressure dressing. The patient tolerated the procedure well, was given detailed written and verbal wound care instructions, and was discharged in good condition.   The patient will follow-up: 4 weeks.     Documentation: I have reviewed the above documentation for accuracy and completeness, and I agree with the above.  RUFUS CHRISTELLA HOLY, MD

## 2023-09-11 ENCOUNTER — Other Ambulatory Visit: Payer: Self-pay

## 2023-09-11 ENCOUNTER — Other Ambulatory Visit: Payer: Self-pay | Admitting: Dermatology

## 2023-09-11 ENCOUNTER — Other Ambulatory Visit (HOSPITAL_COMMUNITY): Payer: Self-pay

## 2023-09-11 MED ORDER — TRAMADOL HCL 50 MG PO TABS
50.0000 mg | ORAL_TABLET | Freq: Four times a day (QID) | ORAL | 0 refills | Status: AC | PRN
  Filled 2023-09-11: qty 8, 2d supply, fill #0

## 2023-09-16 ENCOUNTER — Encounter: Payer: Self-pay | Admitting: Dermatology

## 2023-09-17 ENCOUNTER — Ambulatory Visit (INDEPENDENT_AMBULATORY_CARE_PROVIDER_SITE_OTHER): Admitting: Otolaryngology

## 2023-09-17 ENCOUNTER — Encounter (INDEPENDENT_AMBULATORY_CARE_PROVIDER_SITE_OTHER): Payer: Self-pay | Admitting: Otolaryngology

## 2023-09-17 ENCOUNTER — Other Ambulatory Visit (HOSPITAL_COMMUNITY): Payer: Self-pay

## 2023-09-17 ENCOUNTER — Other Ambulatory Visit: Payer: Self-pay

## 2023-09-17 VITALS — BP 161/86 | HR 78 | Ht 62.0 in | Wt 133.0 lb

## 2023-09-17 DIAGNOSIS — H608X3 Other otitis externa, bilateral: Secondary | ICD-10-CM

## 2023-09-17 DIAGNOSIS — H6993 Unspecified Eustachian tube disorder, bilateral: Secondary | ICD-10-CM

## 2023-09-17 DIAGNOSIS — H903 Sensorineural hearing loss, bilateral: Secondary | ICD-10-CM

## 2023-09-17 DIAGNOSIS — H938X3 Other specified disorders of ear, bilateral: Secondary | ICD-10-CM | POA: Diagnosis not present

## 2023-09-17 MED ORDER — NEOMYCIN-POLYMYXIN-HC 3.5-10000-1 OT SOLN
4.0000 [drp] | Freq: Two times a day (BID) | OTIC | 1 refills | Status: AC
Start: 2023-09-17 — End: 2023-10-01
  Filled 2023-09-17: qty 10, 14d supply, fill #0

## 2023-09-17 MED ORDER — BETAMETHASONE DIPROPIONATE 0.05 % EX CREA
TOPICAL_CREAM | Freq: Two times a day (BID) | CUTANEOUS | 0 refills | Status: AC
  Filled 2023-09-17: qty 30, 14d supply, fill #0

## 2023-09-17 NOTE — Progress Notes (Signed)
 Dear Dr. Loreli, Here is my assessment for our mutual patient, Kathy Barker. Thank you for allowing me the opportunity to care for your patient. Please do not hesitate to contact me should you have any other questions. Sincerely, Dr. Eldora Blanch  Otolaryngology Clinic Note Referring provider: Dr. Loreli HPI:  Kathy Barker is a 63 y.o. female kindly referred by Dr. Loreli for evaluation of ear discomfort.  Initially saw Kathy Barker.   --------------------------------------------------------- 09/17/2023 She reports that she is doing very well from a LPR standpoint. She reports that she is having constant discomfort in both ears (right > left). She feels like she needs to dig inside the ear - also has a fair amount of itching. She has OTC ear drops, and it does not help. She denies significant fullness. There may be times when she feels like there is fluid but that is not the thing that is a problem for her. No drainage or tinnitus. Hearing without issue. Her pain is more so on the front of ear - no dental issues she is aware of. No neck pain recently. Does have some discomfort with flying  Patient denies: vertigo, drainage, tinnitus Patient also denies barotrauma, vestibular suppressant use, ototoxic medication use Prior ear surgery: no  H&N Surgery: FESS(?) Personal or FHx of bleeding dz or anesthesia difficulty: no  GLP-1: no AP/AC: no  Tobacco: no  PMHx: Breast Ca, GERD, h/o Migraines  Independent Review of Additional Tests or Records:  Kathy Cohen, PA-C Notes 04/16/2023, 07/20/2023: noted ear pain starting Nov 2024 with ear fullness; after cleaning, significant improvement. PCP noted effusion, referred her. Also noted prior sinus surgery and recent sinusitis; initially treated for ETD and LPR. In July, continued noted discomfort especially after flying. TFL was done, no obvious effusion noted. Ref to Dr. Blanch 04/2023 Audiogram was independently reviewed and interpreted by me and it reveals - A/A  tymps - mild cookie bite mid-frequency SNHL; WRT 100% AU at 50dB  SNHL= Sensorineural hearing loss CMP 04/22/2022: BUN/Cr 8/0.81  PMH/Meds/All/SocHx/FamHx/ROS:   Past Medical History:  Diagnosis Date   Allergic rhinitis    Anxiety    only during my divorce (11/18/2012)   Basal cell carcinoma 06/17/2023   inferior mid forehead- Mohs 09/10/2023.   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 by Dr Kelsie     Past Surgical History:  Procedure Laterality Date   BREAST LUMPECTOMY WITH RADIOACTIVE SEED AND SENTINEL LYMPH NODE BIOPSY Left 12/14/2019   Procedure: LEFT BREAST LUMPECTOMY X 2 WITH RADIOACTIVE SEED AND SENTINEL LYMPH NODE BIOPSY;  Surgeon: Curvin Deward MOULD, MD;  Location: [facility];  Service: General;  Laterality: Left;  PEC BLOCK   CESAREAN SECTION  01/13/1990   COLOSTOMY     COMBINED HYSTERECTOMY VAGINAL / OOPHORECTOMY / A&P REPAIR  01/13/1997   IR IMAGING GUIDED PORT INSERTION  06/30/2019   IR REMOVAL TUN ACCESS W/ PORT W/O  FL MOD SED  05/24/2020   SUPRAVENTRICULAR TACHYCARDIA ABLATION  11/18/2012   slow pathway modification of AVNRT by Dr Kelsie   SUPRAVENTRICULAR TACHYCARDIA ABLATION N/A 11/18/2012   Procedure: SUPRAVENTRICULAR TACHYCARDIA ABLATION;  Surgeon: Lynwood JONETTA Kelsie, MD;  Location: [facility];  Service: Cardiovascular;  Laterality: N/A;   UPPER GASTROINTESTINAL ENDOSCOPY     VAGINAL HYSTERECTOMY  ~ 2000    Family History  Problem Relation Age of Onset   Breast cancer Mother     Neuropathy Mother    Heart attack Father    Alcohol abuse Father    Heart attack Paternal Grandfather    Heart Problems Other        both sides of family   Rectal cancer Neg Hx    Stomach cancer Neg Hx    Colon cancer Neg Hx    Esophageal cancer Neg Hx      Social Connections: Not on file      Current Outpatient Medications:    azelastine  (ASTELIN ) 0.1 % nasal spray, Place 2 sprays into both nostrils 2 (two) times daily. Use in each nostril as directed, Disp: 30 mL, Rfl: 12   betamethasone  dipropionate 0.05 % cream, Apply topically 2 (two) times daily for 14 days., Disp: 30 g, Rfl: 0   neomycin -polymyxin-hydrocortisone (CORTISPORIN ) OTIC solution, Place 4 drops into both ears in the morning and at bedtime for 14 days., Disp: 10 mL, Rfl: 1   sertraline  (ZOLOFT ) 100 MG tablet, Take 2 tablets by mouth daily., Disp: , Rfl:    temazepam  (RESTORIL ) 15 MG capsule, 1-2 capsule at bedtime as needed Orally Once a day, Disp: , Rfl:    traMADol  (ULTRAM ) 50 MG tablet, Take 1 tablet (50 mg total) by mouth every 6 (six) hours as needed., Disp: 8 tablet, Rfl: 0   rosuvastatin  (CRESTOR ) 5 MG tablet, Take 1 tablet (5 mg total) by mouth daily., Disp: 90 tablet, Rfl: 2   temazepam  (RESTORIL ) 15 MG capsule, Take 1-2 capsules (15-30 mg total) by mouth at bedtime as needed., Disp: 60 capsule, Rfl: 0   Physical Exam:   BP (!) 161/86 (BP Location: Right Arm, Patient Position: Sitting, Cuff Size: Normal)   Pulse 78   Ht 5' 2 (1.575 m)   Wt 133 lb (60.3 kg)   SpO2 98%   BMI 24.33 kg/m   Salient findings:  CN II-XII intact Given history and complaints, ear microscopy was indicated and performed for evaluation with findings as below in physical exam section and in procedures; Bilateral EAC clear and TM intact with well pneumatized middle ear spaces; modest eczematoid change bilateral EAC Weber 512: mid Anterior rhinoscopy: Septum intact; bilateral inferior turbinates without significant hypertrophy No  lesions of oral cavity/oropharynx; dentition fair; minimal TMJ crepitus b/l No obviously palpable neck masses/lymphadenopathy/thyromegaly No respiratory distress or stridor  Seprately Identifiable Procedures:  Prior to initiating any procedures, risks/benefits/alternatives were explained to the patient and verbal consent obtained. Procedure: Bilateral ear microscopy using microscope (CPT [phone]) Pre-procedure diagnosis: bilateral ear fullness, concern for ETD, eczematoid otitis externa Post-procedure diagnosis: same Indication: see above; given patient's otologic complaints and history, for improved and comprehensive examination of external ear and tympanic membrane, bilateral otologic examination using microscope was performed. Prior to proceeding, verbal consent was obtained after discussion of R/B/A  Procedure: Patient was placed semi-recumbent. Both ear canals were examined using the microscope with findings above. Patient tolerated the procedure well.   Impression & Plans:  Kathy Barker is a 63 y.o. female with:  1. Dysfunction of both eustachian tubes   2. Sensorineural hearing loss, bilateral   3. Chronic eczematous otitis externa of both ears   4. Sensation of fullness in both ears    Noted multiple issues - her symptoms appear to be more consistent with Eczematoid OE, which may cause fullness as well. A/A tymps, would not rec BTT. Can consider/continue medical mgmt for possible transient ETD. HL: observe  - Betamethasone  and cortisporin  BID x2 weeks - f/u 6 weeks  See below regarding exact medications prescribed this encounter including dosages and route: Meds ordered this encounter  Medications   betamethasone  dipropionate 0.05 % cream    Sig: Apply topically 2 (two) times daily for 14 days.    Dispense:  30 g    Refill:  0   neomycin -polymyxin-hydrocortisone (CORTISPORIN ) OTIC solution    Sig: Place 4 drops into both ears in the morning and at bedtime for 14 days.     Dispense:  10 mL    Refill:  1      Thank you for allowing me the opportunity to care for your patient. Please do not hesitate to contact me should you have any other questions.  Sincerely, Eldora Blanch, MD Otolaryngologist (ENT), [facility] Phone: [phone] Fax: [phone]  09/27/2023, 7:41 AM   MDM:  Level 4 - 99214 Complexity/Problems addressed: mod - chronic problems Data complexity: mod - independent review of notes, lab, test - Morbidity: mod  - Prescription Drug prescribed or managed: y

## 2023-09-17 NOTE — Patient Instructions (Signed)
 Use cortisporin  drops 4 drops twice daily for 14 days in each ear; right after, use betamethasone  cream twice daily for 2 weeks (just to outside the ear).

## 2023-09-18 ENCOUNTER — Other Ambulatory Visit (HOSPITAL_COMMUNITY): Payer: Self-pay

## 2023-09-18 MED ORDER — TEMAZEPAM 15 MG PO CAPS
15.0000 mg | ORAL_CAPSULE | Freq: Every evening | ORAL | 0 refills | Status: DC | PRN
  Filled 2023-09-18 – 2023-09-25 (×2): qty 60, 30d supply, fill #0

## 2023-09-21 ENCOUNTER — Encounter: Payer: Self-pay | Admitting: Pharmacist

## 2023-09-21 ENCOUNTER — Other Ambulatory Visit: Payer: Self-pay

## 2023-09-22 ENCOUNTER — Other Ambulatory Visit (HOSPITAL_COMMUNITY): Payer: Self-pay

## 2023-09-22 ENCOUNTER — Other Ambulatory Visit: Payer: Self-pay

## 2023-09-22 MED ORDER — ROSUVASTATIN CALCIUM 5 MG PO TABS
5.0000 mg | ORAL_TABLET | Freq: Every day | ORAL | 2 refills | Status: AC
  Filled 2023-09-22: qty 90, 90d supply, fill #0

## 2023-09-24 ENCOUNTER — Other Ambulatory Visit: Payer: Self-pay

## 2023-09-25 ENCOUNTER — Other Ambulatory Visit (HOSPITAL_COMMUNITY): Payer: Self-pay

## 2023-10-08 ENCOUNTER — Ambulatory Visit: Admitting: Dermatology

## 2023-10-19 ENCOUNTER — Ambulatory Visit: Admitting: Dermatology

## 2023-10-19 ENCOUNTER — Encounter: Payer: Self-pay | Admitting: Dermatology

## 2023-10-19 VITALS — BP 155/88 | HR 92

## 2023-10-19 DIAGNOSIS — C4491 Basal cell carcinoma of skin, unspecified: Secondary | ICD-10-CM

## 2023-10-19 DIAGNOSIS — L905 Scar conditions and fibrosis of skin: Secondary | ICD-10-CM | POA: Diagnosis not present

## 2023-10-19 DIAGNOSIS — L853 Xerosis cutis: Secondary | ICD-10-CM | POA: Diagnosis not present

## 2023-10-19 DIAGNOSIS — Z85828 Personal history of other malignant neoplasm of skin: Secondary | ICD-10-CM

## 2023-10-19 NOTE — Progress Notes (Signed)
   Follow Up Visit   Subjective  Kathy Barker is a 63 y.o. female who presents for the following: follow up from Mohs surgery   The patient presents for follow up from Mohs surgery for a BCC on the mid forehead, treated on 09/10/23, repaired with linear closure. The patient has been bandaging the wound as directed. The endorse the following concerns: bump in scar.  Patient is moving to [place] in a few weeks.   The following portions of the chart were reviewed this encounter and updated as appropriate: medications, allergies, medical history  Review of Systems:  No other skin or systemic complaints except as noted in HPI or Assessment and Plan.  Objective  Well appearing patient in no apparent distress; mood and affect are within normal limits.  A focal examination was performed including scalp, head, and face. All findings within normal limits unless otherwise noted below.  Healing wound with mild erythema  Relevant physical exam findings are noted in the Assessment and Plan.    Assessment & Plan   Scar s/p Mohs for [facility] on mid forehead, treated on 09/10/23, repaired with linear closure - Reassured that wound is healing well - Possible spitting suture at inferior edge - No evidence of infection - No swelling, induration, purulence, dehiscence, or tenderness out of proportion to the clinical exam, see photo above - Discussed that scars take up to 12 months to mature from the date of surgery - Recommend SPF 30+ to scar daily to prevent purple color from UV exposure during scar maturation process - Discussed that erythema and raised appearance of scar will fade over the next 4-6 months - OK to start scar massage at 4-6 weeks post-op - Can consider silicone based products for scar healing starting at 6 weeks post-op - Ok to discontinue ointment daily to wound  HISTORY OF BASAL CELL CARCINOMA OF THE SKIN - No evidence of recurrence today - Recommend regular full body skin  exams - Recommend daily broad spectrum sunscreen SPF 30+ to sun-exposed areas, reapply every 2 hours as needed.  - Call if any new or changing lesions are noted between office visits  Xerosis - diffuse xerotic patches - recommend gentle, hydrating skin care - gentle skin care handout given    Return if symptoms worsen or fail to improve, for pt moving at end of month.  I, Berwyn Lesches, Surg Tech III, am acting as scribe for RUFUS CHRISTELLA HOLY, MD.   Documentation: I have reviewed the above documentation for accuracy and completeness, and I agree with the above.  RUFUS CHRISTELLA HOLY, MD

## 2023-10-19 NOTE — Patient Instructions (Signed)

## 2023-10-20 ENCOUNTER — Ambulatory Visit: Admitting: Dermatology

## 2023-10-20 ENCOUNTER — Other Ambulatory Visit: Payer: Self-pay

## 2023-10-20 ENCOUNTER — Other Ambulatory Visit (HOSPITAL_COMMUNITY): Payer: Self-pay

## 2023-10-20 MED ORDER — ONDANSETRON HCL 4 MG PO TABS
4.0000 mg | ORAL_TABLET | Freq: Three times a day (TID) | ORAL | 1 refills | Status: AC | PRN
  Filled 2023-10-20 (×2): qty 15, 5d supply, fill #0

## 2023-10-20 MED ORDER — DOXYCYCLINE HYCLATE 100 MG PO CAPS
100.0000 mg | ORAL_CAPSULE | Freq: Two times a day (BID) | ORAL | 0 refills | Status: AC
  Filled 2023-10-20 (×2): qty 14, 7d supply, fill #0

## 2023-10-20 MED ORDER — OXYCODONE HCL 5 MG PO TABS
5.0000 mg | ORAL_TABLET | ORAL | 0 refills | Status: AC | PRN
  Filled 2023-10-20 (×2): qty 42, 7d supply, fill #0

## 2023-10-20 MED ORDER — METHOCARBAMOL 500 MG PO TABS
500.0000 mg | ORAL_TABLET | Freq: Four times a day (QID) | ORAL | 0 refills | Status: AC | PRN
  Filled 2023-10-20 (×2): qty 40, 10d supply, fill #0

## 2023-10-29 ENCOUNTER — Ambulatory Visit (INDEPENDENT_AMBULATORY_CARE_PROVIDER_SITE_OTHER): Admitting: Otolaryngology

## 2023-11-01 ENCOUNTER — Other Ambulatory Visit (HOSPITAL_COMMUNITY): Payer: Self-pay

## 2023-11-03 ENCOUNTER — Other Ambulatory Visit: Payer: Self-pay

## 2023-11-03 ENCOUNTER — Other Ambulatory Visit (HOSPITAL_COMMUNITY): Payer: Self-pay

## 2023-11-03 MED ORDER — TEMAZEPAM 15 MG PO CAPS
15.0000 mg | ORAL_CAPSULE | Freq: Every evening | ORAL | 0 refills | Status: DC | PRN
  Filled 2023-11-03: qty 60, 30d supply, fill #0

## 2023-11-04 ENCOUNTER — Other Ambulatory Visit: Payer: Self-pay

## 2023-11-05 ENCOUNTER — Other Ambulatory Visit (HOSPITAL_COMMUNITY): Payer: Self-pay

## 2023-11-11 ENCOUNTER — Other Ambulatory Visit: Payer: Self-pay

## 2023-11-11 ENCOUNTER — Other Ambulatory Visit (HOSPITAL_COMMUNITY): Payer: Self-pay

## 2023-11-11 MED ORDER — MECLIZINE HCL 25 MG PO TABS
25.0000 mg | ORAL_TABLET | Freq: Three times a day (TID) | ORAL | 1 refills | Status: AC | PRN
  Filled 2023-11-11: qty 30, 10d supply, fill #0

## 2023-11-11 MED ORDER — SERTRALINE HCL 100 MG PO TABS
200.0000 mg | ORAL_TABLET | Freq: Every day | ORAL | 3 refills | Status: AC
  Filled 2023-11-11: qty 180, 90d supply, fill #0

## 2023-11-11 MED ORDER — ACYCLOVIR 400 MG PO TABS
ORAL_TABLET | ORAL | 3 refills | Status: AC
  Filled 2023-11-11 (×2): qty 120, 40d supply, fill #0
  Filled 2023-11-11: qty 120, 30d supply, fill #0

## 2023-11-12 ENCOUNTER — Other Ambulatory Visit (HOSPITAL_COMMUNITY): Payer: Self-pay

## 2023-11-16 ENCOUNTER — Other Ambulatory Visit: Payer: Self-pay

## 2023-11-16 ENCOUNTER — Other Ambulatory Visit (HOSPITAL_COMMUNITY): Payer: Self-pay

## 2023-11-17 ENCOUNTER — Other Ambulatory Visit: Payer: Self-pay

## 2023-11-17 ENCOUNTER — Other Ambulatory Visit (HOSPITAL_BASED_OUTPATIENT_CLINIC_OR_DEPARTMENT_OTHER): Payer: Self-pay

## 2023-11-17 ENCOUNTER — Other Ambulatory Visit (HOSPITAL_COMMUNITY): Payer: Self-pay

## 2023-11-17 MED ORDER — ALPRAZOLAM 1 MG PO TABS
1.0000 mg | ORAL_TABLET | Freq: Two times a day (BID) | ORAL | 0 refills | Status: DC | PRN
  Filled 2023-11-17: qty 60, 30d supply, fill #0

## 2023-11-17 MED ORDER — TEMAZEPAM 15 MG PO CAPS
15.0000 mg | ORAL_CAPSULE | Freq: Every evening | ORAL | 0 refills | Status: DC | PRN
  Filled 2023-11-17: qty 60, 30d supply, fill #0

## 2024-01-04 ENCOUNTER — Other Ambulatory Visit (HOSPITAL_COMMUNITY): Payer: Self-pay

## 2024-01-04 ENCOUNTER — Other Ambulatory Visit: Payer: Self-pay

## 2024-01-04 MED ORDER — TEMAZEPAM 15 MG PO CAPS
15.0000 mg | ORAL_CAPSULE | Freq: Every evening | ORAL | 0 refills | Status: DC | PRN
  Filled 2024-01-04: qty 60, 30d supply, fill #0

## 2024-01-04 MED ORDER — ALPRAZOLAM 1 MG PO TABS
1.0000 mg | ORAL_TABLET | Freq: Two times a day (BID) | ORAL | 0 refills | Status: AC | PRN
  Filled 2024-01-04: qty 60, 30d supply, fill #0

## 2024-02-03 ENCOUNTER — Other Ambulatory Visit (HOSPITAL_COMMUNITY): Payer: Self-pay

## 2024-02-03 MED ORDER — TEMAZEPAM 15 MG PO CAPS
15.0000 mg | ORAL_CAPSULE | Freq: Every evening | ORAL | 1 refills | Status: AC | PRN
  Filled 2024-02-03: qty 60, 30d supply, fill #0

## 2024-02-15 ENCOUNTER — Other Ambulatory Visit (HOSPITAL_COMMUNITY): Payer: Self-pay

## 2024-09-06 ENCOUNTER — Inpatient Hospital Stay: Admitting: Hematology and Oncology
